# Patient Record
Sex: Male | Born: 1995
Health system: Southern US, Community
[De-identification: ages and names within clinical notes are randomized; demographics above are authoritative.]

## PROBLEM LIST (undated history)

## (undated) DIAGNOSIS — F72 Severe intellectual disabilities: Secondary | ICD-10-CM

## (undated) DIAGNOSIS — F840 Autistic disorder: Secondary | ICD-10-CM

## (undated) DIAGNOSIS — J189 Pneumonia, unspecified organism: Secondary | ICD-10-CM

## (undated) DIAGNOSIS — J309 Allergic rhinitis, unspecified: Secondary | ICD-10-CM

## (undated) DIAGNOSIS — G809 Cerebral palsy, unspecified: Secondary | ICD-10-CM

## (undated) DIAGNOSIS — R569 Unspecified convulsions: Secondary | ICD-10-CM

## (undated) HISTORY — DX: Severe intellectual disabilities: F72

## (undated) HISTORY — PX: NO PAST SURGERIES: SHX2092

---
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
## 2014-02-08 ENCOUNTER — Other Ambulatory Visit: Payer: Self-pay | Admitting: Pediatrics

## 2014-02-08 DIAGNOSIS — F840 Autistic disorder: Secondary | ICD-10-CM

## 2014-02-09 NOTE — Telephone Encounter (Signed)
One refill authorized and note to make appointment,. Patient needs to be transitioned to adult care under new age policy in office.

## 2014-02-16 ENCOUNTER — Other Ambulatory Visit: Payer: Self-pay | Admitting: Pediatrics

## 2014-02-17 ENCOUNTER — Other Ambulatory Visit: Payer: Self-pay | Admitting: Pediatrics

## 2014-02-17 DIAGNOSIS — F840 Autistic disorder: Secondary | ICD-10-CM

## 2014-02-17 MED ORDER — RISPERIDONE 1 MG/ML PO SOLN: ORAL | Status: DC

## 2014-02-17 NOTE — Telephone Encounter (Signed)
Contacted grandmother who stated [name] is not having any problems with the medication and she wishes to continue use. States intermittent constipation is managed with Miralax and she has sufficient quantity; he has been released from care with GI due to good condition. Informed GM that prescription for risperidone is being sent. He will need CPE in June and we will then discuss transition to adult care. GM states she did not have a preference in providers and would follow this MD's recommendation; recommended [facility].

## 2014-04-28 ENCOUNTER — Other Ambulatory Visit: Payer: Self-pay | Admitting: Pediatrics

## 2014-05-27 ENCOUNTER — Other Ambulatory Visit: Payer: Self-pay | Admitting: Pediatrics

## 2014-08-23 ENCOUNTER — Encounter (HOSPITAL_COMMUNITY): Payer: Self-pay | Admitting: Emergency Medicine

## 2014-08-23 ENCOUNTER — Inpatient Hospital Stay (HOSPITAL_COMMUNITY)
Admission: EM | Admit: 2014-08-23 | Discharge: 2014-08-25 | DRG: 101 | Disposition: A | Discharge status: Home / Self Care | Payer: Medicare Other | Attending: Internal Medicine | Admitting: Internal Medicine

## 2014-08-23 DIAGNOSIS — Z79899 Other long term (current) drug therapy: Secondary | ICD-10-CM

## 2014-08-23 DIAGNOSIS — F840 Autistic disorder: Secondary | ICD-10-CM | POA: Diagnosis present

## 2014-08-23 DIAGNOSIS — H50111 Monocular exotropia, right eye: Secondary | ICD-10-CM | POA: Diagnosis present

## 2014-08-23 DIAGNOSIS — R569 Unspecified convulsions: Secondary | ICD-10-CM

## 2014-08-23 HISTORY — DX: Unspecified convulsions: R56.9

## 2014-08-23 MED ORDER — ONDANSETRON HCL 4 MG/2ML IJ SOLN
4.0000 mg | Freq: Once | INTRAMUSCULAR | Status: AC
  Administered 2014-08-23: 4 mg via INTRAVENOUS
  Filled 2014-08-23: qty 2

## 2014-08-23 MED ORDER — LORAZEPAM 2 MG/ML IJ SOLN
INTRAMUSCULAR | Status: AC
  Filled 2014-08-23: qty 1

## 2014-08-23 MED ORDER — LORAZEPAM 2 MG/ML IJ SOLN: 2.0000 mg | Freq: Once | INTRAMUSCULAR | Status: DC

## 2014-08-23 NOTE — ED Notes (Signed)
Patient had large emesis undigested food. PA notified. Airway airway intact.

## 2014-08-23 NOTE — ED Notes (Signed)
Per EMS, patient autistic, and [place]. Family witnessed seizure activity for 45 seconds, postdictal on arrival. Patient nonverbal, and will not follow commands. No hx of seizures. Vomited prior to arrival,  of zofran given. 18 g in left ac. 15L nonrebreather applied for support after seizure, but removed once patient is more calm. Usually patient can walk, but he cannot follow directions. VS with ems. 130/96, p109, o2 99% on room air, cbg 81.

## 2014-08-23 NOTE — ED Notes (Signed)
Mother clarifies, patient was at the bowling alley earlier today, and because he was so hyper, he was brought home. Later on, he had a seizure at home.

## 2014-08-23 NOTE — ED Notes (Signed)
Tatyana, PA-C, at the bedside.  

## 2014-08-23 NOTE — ED Notes (Signed)
EKG completed and given to ED Doctor.

## 2014-08-23 NOTE — ED Provider Notes (Signed)
CSN: [id number]     Arrival date & time 08/23/14  2221 History   First MD Initiated Contact with Patient 08/23/14 2235     Chief Complaint  Patient presents with  . Seizures     (Consider location/radiation/quality/duration/timing/severity/associated sxs/prior Treatment) HPI Jonathan [name] is a 19 y.o. male with history of autism, presents to emergency department after a seizure. Patient is nonverbal at baseline, mother provides most of the history. Patient was at bowling alley earlier, was brought home early because he was "too hyper and acting out." Mother states that he was at home and she states that she saw him as his eyes rolled back and he started shaking, she was able to catch him and lowered him in a chair. Mother denies patient being sick recently. He has no other complaints. He is postictal after the episode. He wears diapers at baseline, unknown urinary incontinence. Mother states that he has never had a seizure before. He is taking Risperdal daily, no new medications. No falls or head injuries. There are no other complaints.  Past Medical History  Diagnosis Date  . Autism    History reviewed. No pertinent past surgical history. Family History  Problem Relation Age of Onset  . Obesity Other   . Stroke Other   . Hypertension Other   . Arthritis Paternal Grandmother    History  Substance Use Topics  . Smoking status: Never Smoker   . Smokeless tobacco: Not on file  . Alcohol Use: No    Review of Systems  Unable to perform ROS: Patient nonverbal      Allergies  Review of patient's allergies indicates no known allergies.  Home Medications   Prior to Admission medications   Medication Sig Start Date End Date Taking? Authorizing Provider  cetirizine (ZYRTEC) 1 MG/ML syrup Take 10 mls (10 mg) by mouth daily at bedtime as needed for allergy symptom control 07/15/13   Maree Erie, MD  pantoprazole (PROTONIX) 20 MG tablet Take 20 mg by mouth daily.    Historical  Provider, MD  Pediatric Multivitamins-Iron (CHILDRENS MULTIVITAMIN/IRON PO) Take by mouth.    Historical Provider, MD  polyethylene glycol powder (GLYCOLAX/MIRALAX) powder Mix one capful in 8 ounces of liquid and drink daily as needed for relief of constipation 07/15/13   Maree Erie, MD  risperiDONE (RISPERDAL) 1 MG/ML oral solution TAKE 0.5 MLS BY MOUTH DAILY AT BEDTIME FOR CONTROL OF AGGITATION 02/17/14 02/18/15  Maree Erie, MD   BP 124/85 mmHg  Pulse 103  Temp(Src) 98.4 F (36.9 C) (Oral)  Resp 22  Ht 5\' 9"  (1.753 m)  Wt 102 lb (46.267 kg)  BMI 15.06 kg/m2  SpO2 98% Physical Exam  Constitutional: He appears well-developed and well-nourished. No distress.  HENT:  Head: Normocephalic and atraumatic.  Eyes: Conjunctivae and EOM are normal. Pupils are equal, round, and reactive to light.  Neck: Neck supple.  Cardiovascular: Normal rate, regular rhythm and normal heart sounds.   Pulmonary/Chest: Effort normal. No respiratory distress. He has no wheezes. He has no rales.  Abdominal: Soft. Bowel sounds are normal. He exhibits no distension. There is no tenderness. There is no rebound.  Musculoskeletal: He exhibits no edema.  Neurological: He is alert.  Nonverbal at baseline. Does not follow directions. Does not squeeze bilateral hands on command.  Skin: Skin is warm and dry.  Nursing note and vitals reviewed.   ED Course  Procedures (including critical care time) Labs Review Labs Reviewed  CBC WITH DIFFERENTIAL/PLATELET -  Abnormal; Notable for the following:    WBC 13.9 (*)    Neutrophils Relative % 78 (*)    Neutro Abs 10.8 (*)    Monocytes Absolute 1.2 (*)    All other components within normal limits  COMPREHENSIVE METABOLIC PANEL - Abnormal; Notable for the following:    Glucose, Bld 116 (*)    ALT 16 (*)    Anion gap 17 (*)    All other components within normal limits  URINALYSIS, ROUTINE W REFLEX MICROSCOPIC (NOT AT [facility]) - Abnormal; Notable for the following:     APPearance CLOUDY (*)    Protein, ur 100 (*)    All other components within normal limits  URINE MICROSCOPIC-ADD ON - Abnormal; Notable for the following:    Squamous Epithelial / LPF FEW (*)    Bacteria, UA FEW (*)    All other components within normal limits  URINE CULTURE  URINE RAPID DRUG SCREEN, HOSP PERFORMED    Imaging Review Dg Chest 1 View  08/24/2014   CLINICAL DATA:  Seizures tonight.  EXAM: CHEST  1 VIEW  COMPARISON:  06/27/2013  FINDINGS: The heart size and mediastinal contours are within normal limits. Both lungs are clear. The visualized skeletal structures are unremarkable.  IMPRESSION: No active disease.   Electronically Signed   By: Ellery Plunk M.D.   On: 08/24/2014 00:56   Ct Head Wo Contrast  08/24/2014   CLINICAL DATA:  Witnessed seizure.  EXAM: CT HEAD WITHOUT CONTRAST  TECHNIQUE: Contiguous axial images were obtained from the base of the skull through the vertex without intravenous contrast.  COMPARISON:  06/27/2013  FINDINGS: No intracranial hemorrhage, mass effect, or midline shift. Incidental note of septum cavum pellucidum, unchanged. No hydrocephalus. The basilar cisterns are patent. No evidence of territorial infarct. No intracranial fluid collection. Calvarium is intact. Included paranasal sinuses and mastoid air cells are well aerated.  IMPRESSION: No acute intracranial abnormality.   Electronically Signed   By: Rubye Oaks M.D.   On: 08/24/2014 00:31     EKG Interpretation   Date/Time:  Tuesday August 23 2014 22:47:30 EDT Ventricular Rate:  100 PR Interval:  127 QRS Duration: 79 QT Interval:  331 QTC Calculation: 427 R Axis:   78 Text Interpretation:  Sinus tachycardia ST elev, probable normal early  repol pattern No significant change since last tracing Confirmed by  Bebe Shaggy  MD, DONALD ([id number]) on 08/23/2014 11:36:25 PM      MDM   Final diagnoses:  Seizure     patient is here after episode of seizure lasting approximately 45  seconds. Mother has been there the whole time. He is postictal, but mother states patient is coming around. Will get blood work.  12:08 AM Patient had another episode of seizure, lasting approximately 30 seconds, tonic-clonic. Patient postictal. He did not receive any medications. Was called in by his mom into the room again, patient having some twitching to the chin and bilateral hands. Rotary movement of the gaze when eyes are opened. Does not follow directions. Will call neurology.  12:15 AM Pt discussed with Dr. Roseanne Reno with neurology. Will start Keppra. They will consult  1:12 AM CT is negative. Chest x-ray negative. Patient is little more awake after second seizure. Keppra is running. Spoke with hospitalist. Family updated.  Filed Vitals:   08/23/14 2245 08/23/14 2300 08/24/14 0000 08/24/14 0045  BP: 115/81 125/88 138/68 127/80  Pulse: 96 98 117 107  Temp:      TempSrc:  Resp: 15 16 19 17   Height:      Weight:      SpO2: 98% 98% 98% 98%     Jaynie Crumble, PA-C 08/24/14 0113  Mancel Bale, MD 08/26/14 [phone]

## 2014-08-24 ENCOUNTER — Emergency Department (HOSPITAL_COMMUNITY): Payer: Medicare Other

## 2014-08-24 ENCOUNTER — Observation Stay (HOSPITAL_COMMUNITY): Payer: Medicare Other

## 2014-08-24 ENCOUNTER — Encounter (HOSPITAL_COMMUNITY): Payer: Self-pay | Admitting: Radiology

## 2014-08-24 DIAGNOSIS — F840 Autistic disorder: Secondary | ICD-10-CM

## 2014-08-24 DIAGNOSIS — G930 Cerebral cysts: Secondary | ICD-10-CM | POA: Diagnosis not present

## 2014-08-24 DIAGNOSIS — H50111 Monocular exotropia, right eye: Secondary | ICD-10-CM | POA: Diagnosis present

## 2014-08-24 DIAGNOSIS — Z79899 Other long term (current) drug therapy: Secondary | ICD-10-CM | POA: Diagnosis not present

## 2014-08-24 DIAGNOSIS — R569 Unspecified convulsions: Secondary | ICD-10-CM

## 2014-08-24 HISTORY — DX: Unspecified convulsions: R56.9

## 2014-08-24 LAB — URINALYSIS, ROUTINE W REFLEX MICROSCOPIC
BILIRUBIN URINE: NEGATIVE
Glucose, UA: NEGATIVE mg/dL
Hgb urine dipstick: NEGATIVE
KETONES UR: NEGATIVE mg/dL
Leukocytes, UA: NEGATIVE
Nitrite: NEGATIVE
Protein, ur: 100 mg/dL — AB
SPECIFIC GRAVITY, URINE: 1.012 (ref 1.005–1.030)
Urobilinogen, UA: 0.2 mg/dL (ref 0.0–1.0)
pH: 8 (ref 5.0–8.0)

## 2014-08-24 LAB — URINE MICROSCOPIC-ADD ON

## 2014-08-24 LAB — CBC WITH DIFFERENTIAL/PLATELET
BASOS ABS: 0.1 10*3/uL (ref 0.0–0.1)
Basophils Absolute: 0 10*3/uL (ref 0.0–0.1)
Basophils Relative: 0 % (ref 0–1)
Basophils Relative: 0 % (ref 0–1)
EOS ABS: 0 10*3/uL (ref 0.0–0.7)
Eosinophils Absolute: 0 10*3/uL (ref 0.0–0.7)
Eosinophils Relative: 0 % (ref 0–5)
Eosinophils Relative: 0 % (ref 0–5)
HCT: 40.1 % (ref 39.0–52.0)
HCT: 42.4 % (ref 39.0–52.0)
HEMOGLOBIN: 14.4 g/dL (ref 13.0–17.0)
Hemoglobin: 14 g/dL (ref 13.0–17.0)
LYMPHS ABS: 0.9 10*3/uL (ref 0.7–4.0)
LYMPHS ABS: 1.8 10*3/uL (ref 0.7–4.0)
Lymphocytes Relative: 10 % — ABNORMAL LOW (ref 12–46)
Lymphocytes Relative: 13 % (ref 12–46)
MCH: 28.3 pg (ref 26.0–34.0)
MCH: 28.5 pg (ref 26.0–34.0)
MCHC: 34 g/dL (ref 30.0–36.0)
MCHC: 34.9 g/dL (ref 30.0–36.0)
MCV: 81.2 fL (ref 78.0–100.0)
MCV: 84 fL (ref 78.0–100.0)
MONO ABS: 1.2 10*3/uL — AB (ref 0.1–1.0)
Monocytes Absolute: 0.8 10*3/uL (ref 0.1–1.0)
Monocytes Relative: 8 % (ref 3–12)
Monocytes Relative: 9 % (ref 3–12)
NEUTROS PCT: 82 % — AB (ref 43–77)
Neutro Abs: 10.8 10*3/uL — ABNORMAL HIGH (ref 1.7–7.7)
Neutro Abs: 7.8 10*3/uL — ABNORMAL HIGH (ref 1.7–7.7)
Neutrophils Relative %: 78 % — ABNORMAL HIGH (ref 43–77)
PLATELETS: 229 10*3/uL (ref 150–400)
Platelets: 180 10*3/uL (ref 150–400)
RBC: 4.94 MIL/uL (ref 4.22–5.81)
RBC: 5.05 MIL/uL (ref 4.22–5.81)
RDW: 13.9 % (ref 11.5–15.5)
RDW: 14.1 % (ref 11.5–15.5)
WBC: 13.9 10*3/uL — ABNORMAL HIGH (ref 4.0–10.5)
WBC: 9.5 10*3/uL (ref 4.0–10.5)

## 2014-08-24 LAB — RAPID URINE DRUG SCREEN, HOSP PERFORMED
Amphetamines: NOT DETECTED
Barbiturates: NOT DETECTED
Benzodiazepines: NOT DETECTED
Cocaine: NOT DETECTED
Opiates: NOT DETECTED
TETRAHYDROCANNABINOL: NOT DETECTED

## 2014-08-24 LAB — COMPREHENSIVE METABOLIC PANEL
ALT: 16 U/L — AB (ref 17–63)
ALT: 16 U/L — ABNORMAL LOW (ref 17–63)
ANION GAP: 7 (ref 5–15)
AST: 30 U/L (ref 15–41)
AST: 30 U/L (ref 15–41)
Albumin: 4.2 g/dL (ref 3.5–5.0)
Albumin: 4.6 g/dL (ref 3.5–5.0)
Alkaline Phosphatase: 68 U/L (ref 38–126)
Alkaline Phosphatase: 73 U/L (ref 38–126)
Anion gap: 17 — ABNORMAL HIGH (ref 5–15)
BILIRUBIN TOTAL: 0.4 mg/dL (ref 0.3–1.2)
BUN: 8 mg/dL (ref 6–20)
BUN: 9 mg/dL (ref 6–20)
CO2: 28 mmol/L (ref 22–32)
CO2: 22 mmol/L (ref 22–32)
CREATININE: 0.81 mg/dL (ref 0.61–1.24)
Calcium: 10 mg/dL (ref 8.9–10.3)
Calcium: 9.5 mg/dL (ref 8.9–10.3)
Chloride: 106 mmol/L (ref 101–111)
Chloride: 106 mmol/L (ref 101–111)
Creatinine, Ser: 0.92 mg/dL (ref 0.61–1.24)
GFR calc Af Amer: >60 mL/min (ref >60)
GFR calc non Af Amer: >60 mL/min (ref >60)
GFR calc non Af Amer: >60 mL/min (ref >60)
GLUCOSE: 92 mg/dL (ref 65–99)
GLUCOSE: 116 mg/dL — AB (ref 65–99)
Potassium: 3.5 mmol/L (ref 3.5–5.1)
Potassium: 4.1 mmol/L (ref 3.5–5.1)
Sodium: 141 mmol/L (ref 135–145)
Sodium: 145 mmol/L (ref 135–145)
TOTAL PROTEIN: 7.9 g/dL (ref 6.5–8.1)
Total Bilirubin: 0.7 mg/dL (ref 0.3–1.2)
Total Protein: 7.6 g/dL (ref 6.5–8.1)

## 2014-08-24 MED ORDER — ACETAMINOPHEN 650 MG RE SUPP: 650.0000 mg | Freq: Four times a day (QID) | RECTAL | Status: DC | PRN

## 2014-08-24 MED ORDER — LORAZEPAM 2 MG/ML IJ SOLN: 1.0000 mg | INTRAMUSCULAR | Status: DC | PRN

## 2014-08-24 MED ORDER — SODIUM CHLORIDE 0.9 % IV SOLN
500.0000 mg | Freq: Two times a day (BID) | INTRAVENOUS | Status: DC
  Administered 2014-08-24 – 2014-08-25 (×3): 500 mg via INTRAVENOUS
  Filled 2014-08-24 (×4): qty 5

## 2014-08-24 MED ORDER — SODIUM CHLORIDE 0.9 % IV SOLN
Freq: Once | INTRAVENOUS | Status: AC
  Administered 2014-08-24: 125 mL/h via INTRAVENOUS

## 2014-08-24 MED ORDER — ONDANSETRON HCL 4 MG/2ML IJ SOLN: 4.0000 mg | Freq: Four times a day (QID) | INTRAMUSCULAR | Status: DC | PRN

## 2014-08-24 MED ORDER — ENOXAPARIN SODIUM 40 MG/0.4ML ~~LOC~~ SOLN
40.0000 mg | Freq: Every day | SUBCUTANEOUS | Status: DC
  Administered 2014-08-24: 40 mg via SUBCUTANEOUS
  Filled 2014-08-24: qty 0.4

## 2014-08-24 MED ORDER — SODIUM CHLORIDE 0.9 % IV SOLN
INTRAVENOUS | Status: DC
  Administered 2014-08-24: 05:00:00 via INTRAVENOUS

## 2014-08-24 MED ORDER — SODIUM CHLORIDE 0.9 % IV SOLN
1000.0000 mg | Freq: Once | INTRAVENOUS | Status: AC
  Administered 2014-08-24: 1000 mg via INTRAVENOUS
  Filled 2014-08-24: qty 10

## 2014-08-24 MED ORDER — LORAZEPAM 2 MG/ML IJ SOLN
1.0000 mg | Freq: Once | INTRAMUSCULAR | Status: AC
Start: 2014-08-24 — End: 2014-08-24
  Administered 2014-08-24: 1 mg via INTRAVENOUS
  Filled 2014-08-24: qty 1

## 2014-08-24 MED ORDER — ONDANSETRON HCL 4 MG PO TABS: 4.0000 mg | ORAL_TABLET | Freq: Four times a day (QID) | ORAL | Status: DC | PRN

## 2014-08-24 MED ORDER — SODIUM CHLORIDE 0.9 % IV SOLN
INTRAVENOUS | Status: DC
  Administered 2014-08-24: 04:00:00 via INTRAVENOUS

## 2014-08-24 MED ORDER — SODIUM CHLORIDE 0.9 % IV SOLN
INTRAVENOUS | Status: DC
  Administered 2014-08-24: 19:00:00 via INTRAVENOUS

## 2014-08-24 MED ORDER — RISPERIDONE 0.5 MG PO TBDP
0.5000 mg | ORAL_TABLET | Freq: Every day | ORAL | Status: DC
  Administered 2014-08-24: 0.5 mg via ORAL
  Filled 2014-08-24 (×2): qty 1

## 2014-08-24 MED ORDER — ENOXAPARIN SODIUM 40 MG/0.4ML ~~LOC~~ SOLN: 40.0000 mg | Freq: Every day | SUBCUTANEOUS | Status: DC

## 2014-08-24 MED ORDER — GADOBENATE DIMEGLUMINE 529 MG/ML IV SOLN
10.0000 mL | Freq: Once | INTRAVENOUS | Status: AC | PRN
  Administered 2014-08-24: 10 mL via INTRAVENOUS

## 2014-08-24 MED ORDER — POLYETHYLENE GLYCOL 3350 17 G PO PACK: 17.0000 g | PACK | Freq: Every day | ORAL | Status: DC | PRN

## 2014-08-24 MED ORDER — ACETAMINOPHEN 325 MG PO TABS: 650.0000 mg | ORAL_TABLET | Freq: Four times a day (QID) | ORAL | Status: DC | PRN

## 2014-08-24 NOTE — Progress Notes (Signed)
Jonathan [name], is a 19 y.o. male, DOB - 05/18/1995, MRN:[id number] admitted this am for Seizures, neuro on Board, MRI and CT stable, EEG pending, on Keppra, seizure free now. Mother bedside updated.    Filed Vitals:   08/24/14 0200 08/24/14 0205 08/24/14 0244 08/24/14 1445  BP:   132/84 129/80  Pulse: 100  96 102  Temp:  97.8 F (36.6 C) 98.6 F (37 C) 97.5 F (36.4 C)  TempSrc:  Axillary Oral Axillary  Resp: 14  14 15   Height:   5' 10.8" (1.798 m)   Weight:   47.356 kg (104 lb 6.4 oz)   SpO2: 100%  100% 98%        Data Review   Micro Results No results found for this or any previous visit (from the past 240 hour(s)).  Radiology Reports Dg Chest 1 View  08/24/2014   CLINICAL DATA:  Seizures tonight.  EXAM: CHEST  1 VIEW  COMPARISON:  06/27/2013  FINDINGS: The heart size and mediastinal contours are within normal limits. Both lungs are clear. The visualized skeletal structures are unremarkable.  IMPRESSION: No active disease.   Electronically Signed   By: Daniel R Mitchell M.D.   On: 08/24/2014 00:56   Ct Head Wo Contrast  08/24/2014   CLINICAL DATA:  Witnessed seizure.  EXAM: CT HEAD WITHOUT CONTRAST  TECHNIQUE: Contiguous axial images were obtained from the base of the skull through the vertex without intravenous contrast.  COMPARISON:  06/27/2013  FINDINGS: No intracranial hemorrhage, mass effect, or midline shift. Incidental note of septum cavum pellucidum, unchanged. No hydrocephalus. The basilar cisterns are patent. No evidence of territorial infarct. No intracranial fluid collection. Calvarium is intact. Included paranasal sinuses and mastoid air cells are well aerated.  IMPRESSION: No acute intracranial abnormality.   Electronically Signed   By: Melanie  Ehinger M.D.   On: 08/24/2014 00:31   Mr Brain W Wo Contrast  08/24/2014   CLINICAL DATA:  Personal history of autism with no onset generalized  seizure, tonic clonic.  EXAM: MRI HEAD WITHOUT AND WITH CONTRAST  TECHNIQUE: Multiplanar, multiecho pulse sequences of the brain and surrounding structures were obtained without and with intravenous contrast.  CONTRAST:  68AuMarylan295[facility] Medic326AuMarylan71[facility]51AuMarylan45[facility] Ambu8723AuMarylan37[place] Hea8076AuMarylan19[place] 6723AuMarylan58[facility] L6732AuMarylanMemorial Hermann Cy4053AuMarylan17[facility]155AuMarylan115[facility] En6454AuMarylan152[facility] 2280AuMarylan254Naples Comm45AuMarylan734[facility] Conva539AuMarylan5429[facility] 3317AuMarylan5[place] Surg2140AuMarylan61[facility] Cent4565AuMarylan73[facility] Ari8796AuMarylaUnion Pines Sur7466AuMarylan46[facility] 6377AuMarylan32Marietta Outpatie5311AuMarylan564South Ms 1062AuMarylan29[address] Specialty Surg9076AuMarylan472Sinai-1366AuMarylan247Maine6373AuMarylan44[facility] 7950AuMarylan513[facility] Ch490AuMarylan6844[facility] Dise425AuMarylan25[facility] 3342AuMarylan584[facility]6323AuMarylan734[facility]8382AuMarylan563[facility]nett HospiHardie PulleyE DIMEGLUMINE 529 MG/ML IV SOLN  COMPARISON:  Head CT same day and 06/27/2013.  FINDINGS: Diffusion imaging does not show any acute infarction or other cause of restricted diffusion. The brainstem and cerebellum are normal. The cerebral hemispheres do not show any evidence of old small or large vessel infarction, mass lesion, hemorrhage, or hydrocephalus. Mesial temporal lobes are normal. After contrast administration, no abnormal enhancement occurs.  The patient does have a cavum septum pellucidum which appears somewhat more dilated than was seen on the study of 06/27/2013. Therefore, this could be an actual expanding cyst of the septum pellucidum which can be symptomatic and associated with certain behavioral disturbances. I am not aware of an association with seizures. Nonetheless, neuro surgical evaluation is warranted, not necessarily emergently however. Certainly this does not appear to be resulting in obstruction or trapping of the lateral ventricles.  IMPRESSION: Normal appearance of the brain parenchyma itself. Expanding cyst of the septum pellucidum. Transverse diameter in the frontal horn region is 20 mm now as opposed 16 mm on 06/27/2013. The significance is indeterminate, but this can be associated with clinical abnormalities including behavioral disturbances.   Electronically Signed   By: Mark  Shogry M.D.   On: 08/24/2014 08:01    CBC  Recent Labs Lab  08/23/14 2351 08/24/14 0444  WBC 13.9* 9.5  HGB 14.4 14.0  HCT 42.4 40.1  PLT 229 180  MCV 84.0 81.2  MCH 28.5 28.3  MCHC 34.0 34.9  RDW 14.1 13.9  LYMPHSABS 1.8 0.9  MONOABS 1.2* 0.8  EOSABS 0.0 0.0  BASOSABS 0.1 0.0    Chemistries   Recent Labs Lab 08/23/14 2351 08/24/14 0444  NA  145 141  K 3.5 4.1  CL 106 106  CO2 22 28  GLUCOSE 116* 92  BUN 9 8  CREATININE 0.92 0.81  CALCIUM 10.0 9.5  AST 30 30  ALT 16* 16*  ALKPHOS 73 68  BILITOT 0.4 0.7   ------------------------------------------------------------------------------------------------------------------ estimated creatinine clearance is 97.5 mL/min (by C-G formula based on Cr of 0.81). ------------------------------------------------------------------------------------------------------------------ No results for input(s): HGBA1C in the last 72 hours. ------------------------------------------------------------------------------------------------------------------ No results for input(s): CHOL, HDL, LDLCALC, TRIG, CHOLHDL, LDLDIRECT in the last 72 hours. ------------------------------------------------------------------------------------------------------------------ No results for input(s): TSH, T4TOTAL, T3FREE, THYROIDAB in the last 72 hours.  Invalid input(s): FREET3 ------------------------------------------------------------------------------------------------------------------ No results for input(s): VITAMINB12, FOLATE, FERRITIN, TIBC, IRON, RETICCTPCT in the last 72 hours.  Coagulation profile No results for input(s): INR, PROTIME in the last 168 hours.  No results for input(s): DDIMER in the last 72 hours.  Cardiac Enzymes No results for input(s): CKMB, TROPONINI, MYOGLOBIN in the last 168 hours.  Invalid input(s): CK ------------------------------------------------------------------------------------------------------------------ Invalid input(s): POCBNP

## 2014-08-24 NOTE — Progress Notes (Signed)
EEG completed, results pending. 

## 2014-08-24 NOTE — ED Notes (Signed)
Ativan held per provider request.

## 2014-08-24 NOTE — ED Notes (Signed)
Attempted report. Nurse to call back.

## 2014-08-24 NOTE — Progress Notes (Signed)
Patient went to MRI, unable to stay still for good imaging. Patient has autism and non-verbal as baseline. Craige Cotta NP paged to get order for some medications to calm patient down.   Craige Cotta NP placed order for Ativan 1 mg IV. Will carry out order.

## 2014-08-24 NOTE — Consult Note (Signed)
Admission H&P    Chief Complaint: New onset generalized seizures.  HPI: Jonathan [name] is an 19 y.o. male with a history of autism brought to the emergency room following a witnessed generalized seizure. Patient had a recurrent seizure after arriving in the emergency room. He has no previous history of seizure activity. CT scan of his head was unremarkable. Routine laboratory studies were unremarkable except for cloudy urine with few bacteria. Urine culture is pending. Patient was afebrile. He was given a loading dose of Keppra 1000 mg IV after receiving a total of 4 mg of Ativan IV. He has had no recurrence of seizure activity. Urine drug screen is pending.  Past Medical History  Diagnosis Date  . Autism   . Seizure 08/24/2014    History reviewed. No pertinent past surgical history.  Family History  Problem Relation Age of Onset  . Obesity Other   . Stroke Other   . Hypertension Other   . Arthritis Paternal Grandmother    Social History:  reports that he has never smoked. He does not have any smokeless tobacco history on file. He reports that he does not drink alcohol or use illicit drugs.  Allergies: No Known Allergies  Medications: Patient's preadmission medications were reviewed by me.  ROS: History obtained from patient's guardian.  General ROS: negative for - chills, fatigue, fever, night sweats, weight gain or weight loss Psychological ROS: negative for - behavioral disorder, hallucinations, memory difficulties, mood swings or suicidal ideation Ophthalmic ROS: negative for - blurry vision, double vision, eye pain or loss of vision ENT ROS: negative for - epistaxis, nasal discharge, oral lesions, sore throat, tinnitus or vertigo Allergy and Immunology ROS: negative for - hives or itchy/watery eyes Hematological and Lymphatic ROS: negative for - bleeding problems, bruising or swollen lymph nodes Endocrine ROS: negative for - galactorrhea, hair pattern changes,  polydipsia/polyuria or temperature intolerance Respiratory ROS: negative for - cough, hemoptysis, shortness of breath or wheezing Cardiovascular ROS: negative for - chest pain, dyspnea on exertion, edema or irregular heartbeat Gastrointestinal ROS: negative for - abdominal pain, diarrhea, hematemesis, nausea/vomiting or stool incontinence Genito-Urinary ROS: negative for - dysuria, hematuria, incontinence or urinary frequency/urgency Musculoskeletal ROS: negative for - joint swelling or muscular weakness Neurological ROS: as noted in HPI Dermatological ROS: negative for rash and skin lesion changes  Physical Examination: Blood pressure 127/80, pulse 107, temperature 98.4 F (36.9 C), temperature source Oral, resp. rate 17, height 5' 9"  (1.753 m), weight 46.267 kg (102 lb), SpO2 98 %.  HEENT-  Normocephalic, no lesions, without obvious abnormality.  Normal external eye and conjunctiva.  Normal TM's bilaterally.  Normal auditory canals and external ears. Normal external nose, mucus membranes and septum.  Normal pharynx. Neck supple with no masses, nodes, nodules or enlargement. Cardiovascular - regular rate and rhythm, S1, S2 normal, no murmur, click, rub or gallop Lungs - chest clear, no wheezing, rales, normal symmetric air entry Abdomen - soft, non-tender; bowel sounds normal; no masses,  no organomegaly Extremities - no edema and no skin discoloration; bilateral plantar flexion contractures of ankles noted.  Neurologic Examination: Patient was obtunded and in no distress. He had withdrawal movements to noxious stimuli. Respirations were regular and normal. Pupils were equal and reacted normally to light. Extraocular movements were intact with oculocephalic maneuvers. Exotropia right eye was noted. No facial weakness was noted. Muscle tone was flaccid throughout. Deep tendon reflexes were 2+, brisk and symmetrical. Motor responses were mute bilaterally.  Results for orders placed or  performed during the hospital encounter of 08/23/14 (from the past 48 hour(s))  Urinalysis, Routine w reflex microscopic (not at [facility])     Status: Abnormal   Collection Time: 08/23/14 11:35 PM  Result Value Ref Range   Color, Urine YELLOW YELLOW   APPearance CLOUDY (A) CLEAR   Specific Gravity, Urine 1.012 1.005 - 1.030   pH 8.0 5.0 - 8.0   Glucose, UA NEGATIVE NEGATIVE mg/dL   Hgb urine dipstick NEGATIVE NEGATIVE   Bilirubin Urine NEGATIVE NEGATIVE   Ketones, ur NEGATIVE NEGATIVE mg/dL   Protein, ur 100 (A) NEGATIVE mg/dL   Urobilinogen, UA 0.2 0.0 - 1.0 mg/dL   Nitrite NEGATIVE NEGATIVE   Leukocytes, UA NEGATIVE NEGATIVE  Urine microscopic-add on     Status: Abnormal   Collection Time: 08/23/14 11:35 PM  Result Value Ref Range   Squamous Epithelial / LPF FEW (A) RARE   WBC, UA 0-2 <3 WBC/hpf   RBC / HPF 0-2 <3 RBC/hpf   Bacteria, UA FEW (A) RARE  CBC with Differential     Status: Abnormal   Collection Time: 08/23/14 11:51 PM  Result Value Ref Range   WBC 13.9 (H) 4.0 - 10.5 K/uL   RBC 5.05 4.22 - 5.81 MIL/uL   Hemoglobin 14.4 13.0 - 17.0 g/dL   HCT 42.4 39.0 - 52.0 %   MCV 84.0 78.0 - 100.0 fL   MCH 28.5 26.0 - 34.0 pg   MCHC 34.0 30.0 - 36.0 g/dL   RDW 14.1 11.5 - 15.5 %   Platelets 229 150 - 400 K/uL   Neutrophils Relative % 78 (H) 43 - 77 %   Neutro Abs 10.8 (H) 1.7 - 7.7 K/uL   Lymphocytes Relative 13 12 - 46 %   Lymphs Abs 1.8 0.7 - 4.0 K/uL   Monocytes Relative 9 3 - 12 %   Monocytes Absolute 1.2 (H) 0.1 - 1.0 K/uL   Eosinophils Relative 0 0 - 5 %   Eosinophils Absolute 0.0 0.0 - 0.7 K/uL   Basophils Relative 0 0 - 1 %   Basophils Absolute 0.1 0.0 - 0.1 K/uL  Comprehensive metabolic panel     Status: Abnormal   Collection Time: 08/23/14 11:51 PM  Result Value Ref Range   Sodium 145 135 - 145 mmol/L   Potassium 3.5 3.5 - 5.1 mmol/L   Chloride 106 101 - 111 mmol/L   CO2 22 22 - 32 mmol/L   Glucose, Bld 116 (H) 65 - 99 mg/dL   BUN 9 6 - 20 mg/dL    Creatinine, Ser 0.92 0.61 - 1.24 mg/dL   Calcium 10.0 8.9 - 10.3 mg/dL   Total Protein 7.9 6.5 - 8.1 g/dL   Albumin 4.6 3.5 - 5.0 g/dL   AST 30 15 - 41 U/L   ALT 16 (L) 17 - 63 U/L   Alkaline Phosphatase 73 38 - 126 U/L   Total Bilirubin 0.4 0.3 - 1.2 mg/dL   GFR calc non Af Amer >60 >60 mL/min   GFR calc Af Amer >60 >60 mL/min    Comment: (NOTE) The eGFR has been calculated using the CKD EPI equation. This calculation has not been validated in all clinical situations. eGFR's persistently <60 mL/min signify possible Chronic Kidney Disease.    Anion gap 17 (H) 5 - 15   Dg Chest 1 View  08/24/2014   CLINICAL DATA:  Seizures tonight.  EXAM: CHEST  1 VIEW  COMPARISON:  06/27/2013  FINDINGS: The heart size and  mediastinal contours are within normal limits. Both lungs are clear. The visualized skeletal structures are unremarkable.  IMPRESSION: No active disease.   Electronically Signed   By: Andreas Newport M.D.   On: 08/24/2014 00:56   Ct Head Wo Contrast  08/24/2014   CLINICAL DATA:  Witnessed seizure.  EXAM: CT HEAD WITHOUT CONTRAST  TECHNIQUE: Contiguous axial images were obtained from the base of the skull through the vertex without intravenous contrast.  COMPARISON:  06/27/2013  FINDINGS: No intracranial hemorrhage, mass effect, or midline shift. Incidental note of septum cavum pellucidum, unchanged. No hydrocephalus. The basilar cisterns are patent. No evidence of territorial infarct. No intracranial fluid collection. Calvarium is intact. Included paranasal sinuses and mastoid air cells are well aerated.  IMPRESSION: No acute intracranial abnormality.   Electronically Signed   By: Jeb Levering M.D.   On: 08/24/2014 00:31    Assessment/Plan 19 year old with autism presenting with new onset generalized seizures. Clinical exam was nonfocal. CT showed no acute intracranial abnormality.  Recommendations: 1. Continue Keppra at 500 mg twice a day. 2. MRI of the brain without and with  contrast. 3. EEG, routine study. 4. Outpatient neurology follow-up following discharge.  We will continue to follow this patient with you.  C.R. Nicole Kindred, MD [facility] [phone]  08/24/2014, 1:13 AM

## 2014-08-24 NOTE — Progress Notes (Signed)
Patient was at MRI, Ativan  IV was given at [phone]. RN unable to scan patient ID band and Ativan.

## 2014-08-24 NOTE — Progress Notes (Signed)
Received report on patient from Darl Pikes, ED nurse.

## 2014-08-24 NOTE — Progress Notes (Signed)
Admission Note  Patient arrived to floor in room 5W01 via bed from ED. Patient alert, nonverbal, not follow commands, unable to determine patient's orientation. Vitals signs  Oral temperature 98.6 F       Blood pressure 132/84       Pulse 96       RR 14       SpO2 100 % on room air. Faces pain scale 0. Skin intact, no pressure ulcer noted in sacral area. Telemetry monitor placed.  Patient's ID armband verified with patient/ family, and in place. Information packet given to patient/ family. Fall risk assessed, SR up X2, patient/ family able to verbalize understanding of risks associated with falls and to call nurse or staff to assist before getting out of bed. Patient/ family oriented to room and equipment. Call bell within reach.

## 2014-08-24 NOTE — Progress Notes (Signed)
[facility] Admissions paged for admitting MD to see patient. 

## 2014-08-24 NOTE — Procedures (Signed)
ELECTROENCEPHALOGRAM REPORT   Patient: Jonathan [name]       Room #: 1B14 EEG No. ID: [id number] Age: 19 y.o.        Sex: male Referring Physician: Toniann Fail Report Date:  08/24/2014        Interpreting Physician: Thana Farr  History: [name] is an 19 y.o. male with a history of autism presenting with new onset seizures.    Medications:  Scheduled: . enoxaparin (LOVENOX) injection  40 mg Subcutaneous Daily  . levETIRAcetam  500 mg Intravenous Q12H  . risperiDONE  0.5 mg Oral QHS    Conditions of Recording:  This is a 16 channel EEG carried out with the patient in the drowsy and asleep state.Marland Kitchen  Description:  The patient does not achieve wakefulness during the recording for a waking background activity to be evaluated.  The patient is in drowse and sleep for the entire recording.  During drowse the background activity is slow consisting of a continuous mixture of theta and delta activity.  Superimposed is a well organized beta activity that is diffusely distributed.  This beta activity continues during sleep.  Also noted during sleep is  symmetrical sleep spindles and vertex central sharp transients. No epileptiform activity is noted.   Hyperventilation was not performed.  Intermittent photic stimulation was performed but failed to illicit any change in the tracing.     IMPRESSION: This is a normal drowsy and asleep electroencephalogram.  The diffusely distributed beta activity is likely a medication effect related to benzodiazepine administration.  No epileptiform activity is noted.     Thana Farr, MD [facility] [phone] 08/24/2014, 4:02 PM

## 2014-08-24 NOTE — H&P (Signed)
[facility] History and Physical  Jonathan [name] ZOX:[id number] DOB: 1995/07/30 DOA: 08/23/2014  Referring physician: Ms. Lemont Fillers. PCP: Maree Erie, MD  Specialists: None.  Chief Complaint: Seizure.  HPI: Jonathan [name] is a 19 y.o. male with history of autism was brought to the ER after patient was witnessed to have a generalized seizure by patient's mother. Patient also had another seizure in the ER. Patient's first seizure at home lasted for 45 seconds and was tonic-clonic. Patient did not lose consciousness following the seizure. Patient had another similar one in the ER witnessed by the ER physician assistant. On-call neurologist was called and patient was given Keppra loading dose. CT head did not show anything acute. As per patient's mother patient has been drowsy after the seizure and not back to his baseline. Patient is usually noncommunicative given his history of autism.   Review of Systems: As presented in the history of presenting illness, rest negative.  Past Medical History  Diagnosis Date  . Autism   . Seizure 08/24/2014   History reviewed. No pertinent past surgical history. Social History:  reports that he has never smoked. He does not have any smokeless tobacco history on file. He reports that he does not drink alcohol or use illicit drugs. Where does patient live home. Can patient participate in ADLs? No.  No Known Allergies  Family History:  Family History  Problem Relation Age of Onset  . Obesity Other   . Stroke Other   . Hypertension Other   . Arthritis Paternal Grandmother   . Seizures Maternal Uncle       Prior to Admission medications   Medication Sig Start Date End Date Taking? Authorizing Provider  cetirizine (ZYRTEC) 1 MG/ML syrup Take 10 mls (10 mg) by mouth daily at bedtime as needed for allergy symptom control 07/15/13  Yes Maree Erie, MD  polyethylene glycol powder (GLYCOLAX/MIRALAX) powder Mix one capful in 8 ounces of liquid and  drink daily as needed for relief of constipation 07/15/13  Yes Maree Erie, MD  risperiDONE (RISPERDAL) 1 MG/ML oral solution TAKE 0.5 MLS BY MOUTH DAILY AT BEDTIME FOR CONTROL OF AGGITATION 02/17/14 02/18/15 Yes Maree Erie, MD    Physical Exam: Filed Vitals:   08/24/14 0130 08/24/14 0200 08/24/14 0205 08/24/14 0244  BP: 131/84   132/84  Pulse: 103 100  96  Temp:   97.8 F (36.6 C) 98.6 F (37 C)  TempSrc:   Axillary Oral  Resp: Height:    5' 10.8" (1.798 m)  Weight:    47.356 kg (104 lb 6.4 oz)  SpO2: 100% 100%  100%     General:  Moderately built and nourished.  Eyes: Anicteric no pallor.  ENT: No discharge from the ears eyes nose or mouth.  Neck: No mass felt.  Cardiovascular: S1 and S2 heard.  Respiratory: No rhonchi or crepitations.  Abdomen: Soft nontender bowel sounds present.  Skin: No rash.  Musculoskeletal: No edema.  Psychiatric: Patient has history of autism.  Neurologic: Patient has history of autism and presently mildly drowsy and neurological exam not complete.  Labs on Admission:  Basic Metabolic Panel:  Recent Labs Lab 08/23/14 2351  NA 145  K 3.5  CL 106  CO2 22  GLUCOSE 116*  BUN 9  CREATININE 0.92  CALCIUM 10.0   Liver Function Tests:  Recent Labs Lab 08/23/14 2351  AST 30  ALT 16*  ALKPHOS 73  BILITOT 0.4  PROT 7.9  ALBUMIN 4.6   No results for input(s): LIPASE, AMYLASE in the last 168 hours. No results for input(s): AMMONIA in the last 168 hours. CBC:  Recent Labs Lab 08/23/14 2351  WBC 13.9*  NEUTROABS 10.8*  HGB 14.4  HCT 42.4  MCV 84.0  PLT 229   Cardiac Enzymes: No results for input(s): CKTOTAL, CKMB, CKMBINDEX, TROPONINI in the last 168 hours.  BNP (last 3 results) No results for input(s): BNP in the last 8760 hours.  ProBNP (last 3 results) No results for input(s): PROBNP in the last 8760 hours.  CBG: No results for input(s): GLUCAP in the last 168 hours.  Radiological Exams  on Admission: Dg Chest 1 View  08/24/2014   CLINICAL DATA:  Seizures tonight.  EXAM: CHEST  1 VIEW  COMPARISON:  06/27/2013  FINDINGS: The heart size and mediastinal contours are within normal limits. Both lungs are clear. The visualized skeletal structures are unremarkable.  IMPRESSION: No active disease.   Electronically Signed   By: Ellery Plunk M.D.   On: 08/24/2014 00:56   Ct Head Wo Contrast  08/24/2014   CLINICAL DATA:  Witnessed seizure.  EXAM: CT HEAD WITHOUT CONTRAST  TECHNIQUE: Contiguous axial images were obtained from the base of the skull through the vertex without intravenous contrast.  COMPARISON:  06/27/2013  FINDINGS: No intracranial hemorrhage, mass effect, or midline shift. Incidental note of septum cavum pellucidum, unchanged. No hydrocephalus. The basilar cisterns are patent. No evidence of territorial infarct. No intracranial fluid collection. Calvarium is intact. Included paranasal sinuses and mastoid air cells are well aerated.  IMPRESSION: No acute intracranial abnormality.   Electronically Signed   By: Rubye Oaks M.D.   On: 08/24/2014 00:31    EKG: Independently reviewed. Normal sinus rhythm with ST changes probably from early repolarization.  Assessment/Plan Principal Problem:   Seizure   1. Seizure - appreciate neurology consult. MRI brain with and without contrast has been ordered and also EEG. Patient has received Keppra loading dose and I have placed patient on 500 mg every 12 hourly as recommended by the neurologist. When necessary Ativan for any breakthrough seizures. 2. History of autism - continue Risperdal.  Patient's urine shows bacteria positive but is nitrites and leukocyte esterase negative. Follow urine cultures.   DVT Prophylaxis Lovenox.  Code Status: Full code.  Family Communication: Discussed with patient's mother.  Disposition Plan: Admit for observation.    [name] N. [facility] Pager [phone].  If 7PM-7AM,  please contact night-coverage [email or web address] Password [facility] 08/24/2014, 3:57 AM

## 2014-08-24 NOTE — ED Notes (Signed)
Admitting physician at the bedside at this time. 

## 2014-08-24 NOTE — ED Provider Notes (Signed)
  Face-to-face evaluation   History: Patient at home today and had a seizure. No history of similar. No other known recent illnesses.  Physical exam:  Medical screening examination/treatment/procedure(s) were conducted as a shared visit with non-physician practitioner(s) and myself.  I personally evaluated the patient during the encounter  Mancel Bale, MD 08/26/14 [phone]

## 2014-08-25 DIAGNOSIS — R569 Unspecified convulsions: Principal | ICD-10-CM

## 2014-08-25 MED ORDER — LEVETIRACETAM 500 MG PO TABS: 500.0000 mg | ORAL_TABLET | Freq: Two times a day (BID) | ORAL | Status: DC

## 2014-08-25 NOTE — Progress Notes (Signed)
Patient discharge teaching given, including activity, diet, follow-up appoints, and medications. Patient verbalized understanding of all discharge instructions. IV access was d/c'd. Vitals are stable. Skin is intact except as charted in most recent assessments. Pt to be escorted out by NT, to be driven home by family. 

## 2014-08-25 NOTE — Care Management Note (Signed)
Case Management Note  Patient Details  Name: Jonathan [name] MRN: [id number] Date of Birth: [birth year]  Subjective/Objective:                 Patient from home with mother, autistic adult who had a  seizure.    Action/Plan:  Will discharge to home with mother. No HH or DME needs identified.  Expected Discharge Date:  08/26/14               Expected Discharge Plan:  Home/Self Care  In-House Referral:     Discharge planning Services  CM Consult  Post Acute Care Choice:    Choice offered to:     DME Arranged:    DME Agency:     HH Arranged:    HH Agency:     Status of Service:  Completed, signed off  Medicare Important Message Given:    Date Medicare IM Given:    Medicare IM give by:    Date Additional Medicare IM Given:    Additional Medicare Important Message give by:     If discussed at Long Length of Stay Meetings, dates discussed:    Additional Comments:  [name], RN 08/25/2014, 2:11 PM

## 2014-08-25 NOTE — Progress Notes (Signed)
NEURO HOSPITALIST PROGRESS NOTE   SUBJECTIVE:                                                                                                                        No further seizures reported. Family is at the bedside and said that he is back to baseline. MRI brain was personally reviewed and showed no acute abnormality. EEG normal. On keppra 500 mg IV BID. Serologies unimpressive.  OBJECTIVE:                                                                                                                           Vital signs in last 24 hours: Temp:  [97.5 F (36.4 C)-98.5 F (36.9 C)] 98.5 F (36.9 C) (07/27 2155) Pulse Rate:  [83-102] 83 (07/28 0644) Resp:  [15-20] 20 (07/28 0644) BP: (126-129)/(76-81) 126/76 mmHg (07/28 0644) SpO2:  [98 %-100 %] 100 % (07/28 0644)  Intake/Output from previous day: 07/27 0701 - 07/28 0700 In: 600 [P.O.:600] Out: 300 [Urine:300] Intake/Output this shift: Total I/O In: -  Out: 400 [Urine:400] Nutritional status: Diet regular Room service appropriate?: Yes; Fluid consistency:: Thin  Past Medical History  Diagnosis Date  . Autism   . Seizure 08/24/2014   Physical Examination:  HEENT- Normocephalic, no lesions, without obvious abnormality. Normal external eye and conjunctiva. Normal TM's bilaterally. Normal auditory canals and external ears. Normal external nose, mucus membranes and septum. Normal pharynx. Neck supple with no masses, nodes, nodules or enlargement. Cardiovascular - regular rate and rhythm, S1, S2 normal, no murmur, click, rub or gallop Lungs - chest clear, no wheezing, rales, normal symmetric air entry Abdomen - soft, non-tender; bowel sounds normal; no masses, no organomegaly Extremities - no edema and no skin discoloration; bilateral plantar flexion contractures of ankles noted.  Neurologic Exam:  Patient is alert and awake, repetitive arm movements, not able to follow  commands Pupils were equal and reacted normally to light. Extraocular movements were intact with oculocephalic maneuvers. Exotropia right eye was noted. No facial weakness was noted. Muscle tone was flaccid throughout. Deep tendon reflexes were 2+, brisk and symmetrical. Motor responses were mute bilaterally.  Lab Results: No results found for: CHOL Lipid Panel No results for input(s): CHOL, TRIG, HDL,  CHOLHDL, VLDL, LDLCALC in the last 72 hours.  Studies/Results: Dg Chest 1 View  08/24/2014   CLINICAL DATA:  Seizures tonight.  EXAM: CHEST  1 VIEW  COMPARISON:  06/27/2013  FINDINGS: The heart size and mediastinal contours are within normal limits. Both lungs are clear. The visualized skeletal structures are unremarkable.  IMPRESSION: No active disease.   Electronically Signed   By: Ellery Plunk M.D.   On: 08/24/2014 00:56   Ct Head Wo Contrast  08/24/2014   CLINICAL DATA:  Witnessed seizure.  EXAM: CT HEAD WITHOUT CONTRAST  TECHNIQUE: Contiguous axial images were obtained from the base of the skull through the vertex without intravenous contrast.  COMPARISON:  06/27/2013  FINDINGS: No intracranial hemorrhage, mass effect, or midline shift. Incidental note of septum cavum pellucidum, unchanged. No hydrocephalus. The basilar cisterns are patent. No evidence of territorial infarct. No intracranial fluid collection. Calvarium is intact. Included paranasal sinuses and mastoid air cells are well aerated.  IMPRESSION: No acute intracranial abnormality.   Electronically Signed   By: Rubye Oaks M.D.   On: 08/24/2014 00:31   Mr Laqueta Jean GN Contrast  08/24/2014   CLINICAL DATA:  Personal history of autism with no onset generalized seizure, tonic clonic.  EXAM: MRI HEAD WITHOUT AND WITH CONTRAST  TECHNIQUE: Multiplanar, multiecho pulse sequences of the brain and surrounding structures were obtained without and with intravenous contrast.  CONTRAST:  10mL MULTIHANCE GADOBENATE DIMEGLUMINE 529 MG/ML  IV SOLN  COMPARISON:  Head CT same day and 06/27/2013.  FINDINGS: Diffusion imaging does not show any acute infarction or other cause of restricted diffusion. The brainstem and cerebellum are normal. The cerebral hemispheres do not show any evidence of old small or large vessel infarction, mass lesion, hemorrhage, or hydrocephalus. Mesial temporal lobes are normal. After contrast administration, no abnormal enhancement occurs.  The patient does have a cavum septum pellucidum which appears somewhat more dilated than was seen on the study of 06/27/2013. Therefore, this could be an actual expanding cyst of the septum pellucidum which can be symptomatic and associated with certain behavioral disturbances. I am not aware of an association with seizures. Nonetheless, neuro surgical evaluation is warranted, not necessarily emergently however. Certainly this does not appear to be resulting in obstruction or trapping of the lateral ventricles.  IMPRESSION: Normal appearance of the brain parenchyma itself. Expanding cyst of the septum pellucidum. Transverse diameter in the frontal horn region is 20 mm now as opposed 16 mm on 06/27/2013. The significance is indeterminate, but this can be associated with clinical abnormalities including behavioral disturbances.   Electronically Signed   By: Paulina Fusi M.D.   On: 08/24/2014 08:01    MEDICATIONS                                                                                                                        Scheduled: . enoxaparin (LOVENOX) injection  40 mg Subcutaneous Daily  . levETIRAcetam  500 mg Intravenous Q12H  .  risperiDONE  0.5 mg Oral QHS    ASSESSMENT/PLAN:                                                                                                            19 year old with autism presenting with new onset generalized seizures.  MRI brain without acute abnormality, EEG normal. Although seizure work up is unremarkable, in this particular  scenario will recommend maintaining patient on keppra 500 mg BID. Follow up with epileptologist Dr Karel Jarvis in 2 weeks. Neurology will sign off.  Wyatt Portela, MD [facility] [phone]  08/25/2014, 9:46 AM

## 2014-08-25 NOTE — Discharge Instructions (Signed)
Follow with Primary MD Maree Erie, MD in 7 days   Get CBC, CMP, 2 view Chest X ray checked  by Primary MD next visit.    Activity: As tolerated with Full fall precautions use walker/cane & assistance as needed   Disposition Home     Diet: Heart Healthy  with feeding assistance and aspiration precautions.  For Heart failure patients - Check your Weight same time everyday, if you gain over 2 pounds, or you develop in leg swelling, experience more shortness of breath or chest pain, call your Primary MD immediately. Follow Cardiac Low Salt Diet and 1.5 lit/day fluid restriction.   On your next visit with your primary care physician please Get Medicines reviewed and adjusted.   Please request your Prim.MD to go over all Hospital Tests and Procedure/Radiological results at the follow up, please get all Hospital records sent to your Prim MD by signing hospital release before you go home.   If you experience worsening of your admission symptoms, develop shortness of breath, life threatening emergency, suicidal or homicidal thoughts you must seek medical attention immediately by calling 911 or calling your MD immediately  if symptoms less severe.  You Must read complete instructions/literature along with all the possible adverse reactions/side effects for all the Medicines you take and that have been prescribed to you. Take any new Medicines after you have completely understood and accpet all the possible adverse reactions/side effects.   Do not drive, operating heavy machinery, perform activities at heights, swimming or participation in water activities or provide baby sitting services if your were admitted for syncope or siezures until you have seen by Primary MD or a Neurologist and advised to do so again.  Do not drive when taking Pain medications.    Do not take more than prescribed Pain, Sleep and Anxiety Medications  Special Instructions: If you have smoked or chewed Tobacco  in  the last 2 yrs please stop smoking, stop any regular Alcohol  and or any Recreational drug use.  Wear Seat belts while driving.   Please note  You were cared for by a hospitalist during your hospital stay. If you have any questions about your discharge medications or the care you received while you were in the hospital after you are discharged, you can call the unit and asked to speak with the hospitalist on call if the hospitalist that took care of you is not available. Once you are discharged, your primary care physician will handle any further medical issues. Please note that NO REFILLS for any discharge medications will be authorized once you are discharged, as it is imperative that you return to your primary care physician (or establish a relationship with a primary care physician if you do not have one) for your aftercare needs so that they can reassess your need for medications and monitor your lab values.

## 2014-08-25 NOTE — Discharge Summary (Signed)
Jonathan [name], is a 19 y.o. male  DOB Aug 13, 1995  MRN [id number].  Admission date:  08/23/2014  Admitting Physician  Eduard Clos, MD  Discharge Date:  08/25/2014   Primary MD  Maree Erie, MD  Recommendations for primary care physician for things to follow:   Needs outpatient neurology follow-up   Admission Diagnosis  Seizure [R56.9]   Discharge Diagnosis  Seizure [R56.9]    Principal Problem:   Seizure      Past Medical History  Diagnosis Date  . Autism   . Seizure 08/24/2014    History reviewed. No pertinent past surgical history.     HPI  from the history and physical done on the day of admission:     Jonathan [name] is a 19 y.o. male with history of autism was brought to the ER after patient was witnessed to have a generalized seizure by patient's mother. Patient also had another seizure in the ER. Patient's first seizure at home lasted for 45 seconds and was tonic-clonic. Patient did not lose consciousness following the seizure. Patient had another similar one in the ER witnessed by the ER physician assistant. On-call neurologist was called and patient was given Keppra loading dose. CT head did not show anything acute. As per patient's mother patient has been drowsy after the seizure and not back to his baseline. Patient is usually noncommunicative given his history of autism.     Hospital Course:    1. Seizure. New onset. MRI CT and EEG unremarkable, seen by neurologist placed on Keppra, outpatient neurology follow-up recommended. After decision of Keppra to further evidence of breakthrough seizures.   2. History of autism. Nonverbal, gets good support from family, continue Risperdal.        Discharge Condition: Fair  Follow UP  Follow-up Information    Follow up with Maree Erie, MD. Schedule an appointment as soon as possible for a visit in 1 week.   Specialty:  Pediatrics   Contact information:   [address] [address] [phone]       Follow up with [facility]. Schedule an appointment as soon as possible for a visit in 1 week.   Why:  seizures   Contact information:   [address]     [address] [place] [address] [phone]       Consults obtained - Neuro  Diet and Activity recommendation: See Discharge Instructions below  Discharge Instructions       Discharge Instructions    Discharge instructions    Complete by:  As directed   Follow with Primary MD Maree Erie, MD in 7 days   Get CBC, CMP, 2 view Chest X ray checked  by Primary MD next visit.    Activity: As tolerated with Full fall precautions use walker/cane & assistance as needed   Disposition Home     Diet: Heart Healthy  with feeding assistance and aspiration precautions.  For Heart failure patients - Check your  Weight same time everyday, if you gain over 2 pounds, or you develop in leg swelling, experience more shortness of breath or chest pain, call your Primary MD immediately. Follow Cardiac Low Salt Diet and 1.5 lit/day fluid restriction.   On your next visit with your primary care physician please Get Medicines reviewed and adjusted.   Please request your Prim.MD to go over all Hospital Tests and Procedure/Radiological results at the follow up, please get all Hospital records sent to your Prim MD by signing hospital release before you go home.   If you experience worsening of your admission symptoms, develop shortness of breath, life threatening emergency, suicidal or homicidal thoughts you must seek medical attention immediately by calling 911 or calling your MD immediately  if symptoms less severe.  You Must read complete instructions/literature along with all the possible adverse reactions/side  effects for all the Medicines you take and that have been prescribed to you. Take any new Medicines after you have completely understood and accpet all the possible adverse reactions/side effects.   Do not drive, operating heavy machinery, perform activities at heights, swimming or participation in water activities or provide baby sitting services if your were admitted for syncope or siezures until you have seen by Primary MD or a Neurologist and advised to do so again.  Do not drive when taking Pain medications.    Do not take more than prescribed Pain, Sleep and Anxiety Medications  Special Instructions: If you have smoked or chewed Tobacco  in the last 2 yrs please stop smoking, stop any regular Alcohol  and or any Recreational drug use.  Wear Seat belts while driving.   Please note  You were cared for by a hospitalist during your hospital stay. If you have any questions about your discharge medications or the care you received while you were in the hospital after you are discharged, you can call the unit and asked to speak with the hospitalist on call if the hospitalist that took care of you is not available. Once you are discharged, your primary care physician will handle any further medical issues. Please note that NO REFILLS for any discharge medications will be authorized once you are discharged, as it is imperative that you return to your primary care physician (or establish a relationship with a primary care physician if you do not have one) for your aftercare needs so that they can reassess your need for medications and monitor your lab values.     Increase activity slowly    Complete by:  As directed              Discharge Medications       Medication List    TAKE these medications        cetirizine 1 MG/ML syrup  Commonly known as:  ZYRTEC  Take 10 mls (10 mg) by mouth daily at bedtime as needed for allergy symptom control     levETIRAcetam 500 MG tablet  Commonly  known as:  KEPPRA  Take 1 tablet (500 mg total) by mouth 2 (two) times daily.     polyethylene glycol powder powder  Commonly known as:  GLYCOLAX/MIRALAX  Mix one capful in 8 ounces of liquid and drink daily as needed for relief of constipation     risperiDONE 1 MG/ML oral solution  Commonly known as:  RISPERDAL  TAKE 0.5 MLS BY MOUTH DAILY AT BEDTIME FOR CONTROL OF AGGITATION        Major procedures and Radiology  Reports - PLEASE review detailed and final reports for all details, in brief -    EEG This is a normal drowsy and asleep electroencephalogram. The diffusely distributed beta activity is likely a medication effect related to benzodiazepine administration. No epileptiform activity is noted.    Dg Chest 1 View  08/24/2014   CLINICAL DATA:  Seizures tonight.  EXAM: CHEST  1 VIEW  COMPARISON:  06/27/2013  FINDINGS: The heart size and mediastinal contours are within normal limits. Both lungs are clear. The visualized skeletal structures are unremarkable.  IMPRESSION: No active disease.   Electronically Signed   By: Ellery Plunk M.D.   On: 08/24/2014 00:56   Ct Head Wo Contrast  08/24/2014   CLINICAL DATA:  Witnessed seizure.  EXAM: CT HEAD WITHOUT CONTRAST  TECHNIQUE: Contiguous axial images were obtained from the base of the skull through the vertex without intravenous contrast.  COMPARISON:  06/27/2013  FINDINGS: No intracranial hemorrhage, mass effect, or midline shift. Incidental note of septum cavum pellucidum, unchanged. No hydrocephalus. The basilar cisterns are patent. No evidence of territorial infarct. No intracranial fluid collection. Calvarium is intact. Included paranasal sinuses and mastoid air cells are well aerated.  IMPRESSION: No acute intracranial abnormality.   Electronically Signed   By: Rubye Oaks M.D.   On: 08/24/2014 00:31   Mr Laqueta Jean ZO Contrast  08/24/2014   CLINICAL DATA:  Personal history of autism with no onset generalized seizure, tonic  clonic.  EXAM: MRI HEAD WITHOUT AND WITH CONTRAST  TECHNIQUE: Multiplanar, multiecho pulse sequences of the brain and surrounding structures were obtained without and with intravenous contrast.  CONTRAST:  10mL MULTIHANCE GADOBENATE DIMEGLUMINE 529 MG/ML IV SOLN  COMPARISON:  Head CT same day and 06/27/2013.  FINDINGS: Diffusion imaging does not show any acute infarction or other cause of restricted diffusion. The brainstem and cerebellum are normal. The cerebral hemispheres do not show any evidence of old small or large vessel infarction, mass lesion, hemorrhage, or hydrocephalus. Mesial temporal lobes are normal. After contrast administration, no abnormal enhancement occurs.  The patient does have a cavum septum pellucidum which appears somewhat more dilated than was seen on the study of 06/27/2013. Therefore, this could be an actual expanding cyst of the septum pellucidum which can be symptomatic and associated with certain behavioral disturbances. I am not aware of an association with seizures. Nonetheless, neuro surgical evaluation is warranted, not necessarily emergently however. Certainly this does not appear to be resulting in obstruction or trapping of the lateral ventricles.  IMPRESSION: Normal appearance of the brain parenchyma itself. Expanding cyst of the septum pellucidum. Transverse diameter in the frontal horn region is 20 mm now as opposed 16 mm on 06/27/2013. The significance is indeterminate, but this can be associated with clinical abnormalities including behavioral disturbances.   Electronically Signed   By: Paulina Fusi M.D.   On: 08/24/2014 08:01    Micro Results      No results found for this or any previous visit (from the past 240 hour(s)).     Today   Subjective    Jonathan [name] today in bed non verbal at baseline, in no distress   Objective   Blood pressure 126/76, pulse 83, temperature 98.5 F (36.9 C), temperature source Oral, resp. rate 20, height 5' 10.8" (1.798  m), weight 47.356 kg (104 lb 6.4 oz), SpO2 100 %.   Intake/Output Summary (Last 24 hours) at 08/25/14 0849 Last data filed at 08/25/14 0755  Gross per 24 hour  Intake    600 ml  Output    700 ml  Net   -100 ml    Exam Awake , Non verbal at baseline, No new F.N deficits, Normal affect [place].AT,PERRAL Supple Neck,No JVD, No cervical lymphadenopathy appriciated.  Symmetrical Chest wall movement, Good air movement bilaterally, CTAB RRR,No Gallops,Rubs or new Murmurs, No Parasternal Heave +ve B.Sounds, Abd Soft, Non tender, No organomegaly appriciated, No rebound -guarding or rigidity. No Cyanosis, Clubbing or edema, No new Rash or bruise   Data Review   CBC w Diff: Lab Results  Component Value Date   WBC 9.5 08/24/2014   HGB 14.0 08/24/2014   HCT 40.1 08/24/2014   PLT 180 08/24/2014   LYMPHOPCT 10* 08/24/2014   MONOPCT 8 08/24/2014   EOSPCT 0 08/24/2014   BASOPCT 0 08/24/2014    CMP: Lab Results  Component Value Date   NA 141 08/24/2014   K 4.1 08/24/2014   CL 106 08/24/2014   CO2 28 08/24/2014   BUN 8 08/24/2014   CREATININE 0.81 08/24/2014   CREATININE 0.64 07/13/2012   PROT 7.6 08/24/2014   ALBUMIN 4.2 08/24/2014   BILITOT 0.7 08/24/2014   ALKPHOS 68 08/24/2014   AST 30 08/24/2014   ALT 16* 08/24/2014  .   Total Time in preparing paper work, data evaluation and todays exam - 35 minutes  Leroy Sea M.D on 08/25/2014 at 8:49 AM  [facility]   Office  [phone]

## 2014-08-26 LAB — URINE CULTURE: Culture: >=100000

## 2014-09-01 ENCOUNTER — Encounter: Payer: Self-pay | Admitting: Neurology

## 2014-09-01 ENCOUNTER — Ambulatory Visit (INDEPENDENT_AMBULATORY_CARE_PROVIDER_SITE_OTHER): Payer: Medicare Other | Admitting: Neurology

## 2014-09-01 VITALS — BP 118/88 | HR 88 | Resp 20 | Ht 69.0 in | Wt 108.0 lb

## 2014-09-01 DIAGNOSIS — F840 Autistic disorder: Secondary | ICD-10-CM

## 2014-09-01 DIAGNOSIS — G40901 Epilepsy, unspecified, not intractable, with status epilepticus: Secondary | ICD-10-CM

## 2014-09-01 DIAGNOSIS — G40301 Generalized idiopathic epilepsy and epileptic syndromes, not intractable, with status epilepticus: Secondary | ICD-10-CM | POA: Diagnosis not present

## 2014-09-01 MED ORDER — LEVETIRACETAM 500 MG PO TABS: 500.0000 mg | ORAL_TABLET | Freq: Two times a day (BID) | ORAL | Status: DC

## 2014-09-01 NOTE — Progress Notes (Signed)
Provider:  Melvyn Novas, M D  Referring Provider: Maree Erie, MD Primary Care Physician:  Maree Erie, MD  Chief Complaint  Patient presents with  . Seizures    new problem of seizures, rm 10, grandmother and community caregiver present  severe mental retardation, autism, severe disability.  HPI:  Jonathan [name] is a 19 y.o. male . seen here as a referral from PCP, Dr. [name] for neurological seizure evaluation,  Jonathan [name] carries a diagnosis of autism with severe cognitive and intellectual delay. He had never had seizures until July 27 when his mother witnessed a generalized seizure. With an arrival time at the ER he developed a second seizure which was witnessed by staff. The first seizure at home lasted for probably less than a minute as tonic-clonic convulsions but he did not lose consciousness following the seizure he appeared postictal and very drowsy. The patient had a similar event was in 2 hours later in the emergency room witnessed by a PA. The on-call neurologist decided to give Her loading dose IV. The patient was brought in for CT of the head which did not show any acute changes there was no evidence of a bleed, infection, swelling. Had not been back to baseline before the second seizure occurred so this would be considered a status epilepticus. The patient is usually noncommunicative and nonverbal due to his autism. His primary care physician is Dr. [name] I asked his mother who was attending this visit if we can obtain appropriate previous diagnostic records and test results regarding Jonathan [name] health. Apparently he was tested in [place] where he was born and diagnosed with cerebral palsy, later worked up for dysphagia and chronic constipation. The patient is very slender and appears younger than his American age.  His grandmother moved him  to the [place] area but he has seen physicians at [facility] , Dr Christance(?)and [name], GI, and seen  in  [place],  [place].  Jonathan [name] had a dramatic birth history. His mother suffered from a sepsis during pregnancy and died after meningitis and she was brain dead but left on life support measures as she was pregnant Jonathan [name] was taken from the warm at gestational age the seventh month of pregnancy. His mother had been comatose since the fifth week of pregnancy. He weighted 2 pounds and 2 ounces and  fit into the hand of his father . He stayed for a long time in the NICU until released. At age [age] year he suffered a bout of pneumonia. He never became fully verbal he is usually nonverbal but produces sounds that he repeats continuously. He learned crawling at 9-10 months ,walking at 14 months but was always tiptoeing.   Jonathan [name] has is baseline some visual impairment but I don't know how to graded or Holter test and given his low ability to cooperate with a vision test / visual field test.    Review of Systems: Out of a complete 14 system review, the patient complains of only the following symptoms, and all other reviewed systems are negative.; autism , non verbal, MRDD -  History   Social History  . Marital Status: Single    Spouse Name: N/A  . Number of Children: N/A  . Years of Education: N/A   Occupational History  . Not on file.   Social History Main Topics  . Smoking status: Never Smoker   . Smokeless tobacco: Not on file  . Alcohol Use: No  .  Drug Use: No  . Sexual Activity: No   Other Topics Concern  . Not on file   Social History Narrative    Family History  Problem Relation Age of Onset  . Obesity Other   . Stroke Other   . Hypertension Other   . Arthritis Paternal Grandmother   . Seizures Maternal Uncle     Past Medical History  Diagnosis Date  . Autism   . Seizure 08/24/2014  . Mental retardation, idiopathic severe     History reviewed. No pertinent past surgical history.  Current Outpatient Prescriptions  Medication Sig Dispense Refill  . cetirizine  (ZYRTEC) 1 MG/ML syrup Take 10 mls (10 mg) by mouth daily at bedtime as needed for allergy symptom control 240 mL 6  . levETIRAcetam (KEPPRA) 500 MG tablet Take 1 tablet (500 mg total) by mouth 2 (two) times daily. 60 tablet 2  . polyethylene glycol powder (GLYCOLAX/MIRALAX) powder Mix one capful in 8 ounces of liquid and drink daily as needed for relief of constipation 255 g 6  . risperiDONE (RISPERDAL) 1 MG/ML oral solution TAKE 0.5 MLS BY MOUTH DAILY AT BEDTIME FOR CONTROL OF AGGITATION 30 mL 11   No current facility-administered medications for this visit.    Allergies as of 09/01/2014  . (No Known Allergies)    Vitals: BP 118/88 mmHg  Pulse 88  Resp 20  Ht 5\' 9"  (1.753 m)  Wt 108 lb (48.988 kg)  BMI 15.94 kg/m2 Last Weight:  Wt Readings from Last 1 Encounters:  09/01/14 108 lb (48.988 kg)   Last Height:   Ht Readings from Last 1 Encounters:  09/01/14 5\' 9"  (1.753 m)    Physical exam:  General: The patient is awake, alert and appears not in acute distress. The patient is well groomed. Head: Normocephalic, atraumatic. Neck is supple. Mallampati 2, neck circumference:13  Cardiovascular:  Regular rate and rhythm , without  murmurs or carotid bruit, and without distended neck veins. Respiratory: Lungs are clear to auscultation. Skin:  Without evidence of edema, or rash Trunk: BMI is normal posture.  Neurologic exam : The patient is awake and alert, oriented to place and time.  Memory subjective described as intact.  There is a normal attention span & concentration ability. Speech is absent , no words are formed.non verbal repetition.   Mood and affect are aloof .  Cranial nerves: Pupils are equal and briskly reactive to light. Funduscopic exam without evidence of pallor or edema.  Extraocular movements  in vertical and horizontal planes intact and without nystagmus.  Visual fields by finger perimetry are intact. Hearing to finger rub intact.  Facial sensation appers  intact to fine touch. Facial motor strength is symmetric, but he moves more on the right. The tongue and uvula move midline.  He is incessantly moving. Tongue protrusion was not provided. He has well formed muscle bulk for the muscles of mastication.. Shoulder shrug is normal.   Motor exam:   Sensory:  Fine touch, pinprick and vibration ; the patient cannot assist with this exam. Coordination: Rapid alternating movements in the fingers/hands were not tested due to autism, observation - he moves his left hand much less than his right and hazardous fisted hand he also places his thumb between the index and middle finger. He has a pronation at baseline also speaks for an early childhood or interpregnancy injury and follow for cerebral palsy. The left leg muscles have less bulk than the right especially at the thigh. His left  quadriceps is smaller  than his right .Finger-to-nose maneuver can not be performed. Gait and station: Patient walks without assistive device and is able unassisted to climb up to the exam table.  Strength within normal limits. Stance is stable and normal. Tandem gait is unfragmented. Romberg testing isnegative   Deep tendon reflexes: in the  upper and lower extremities are asymmetric , left brisk with 2 beat clonus at ankle.   Babinski maneuver response is down going right and intestable left.    Assessment:  After physical and neurologic examination, review of laboratory studies, imaging, neurophysiology testing and pre-existing records, assessment of 60 minutes for this difficult patient in with  developmental delay, cerebral palsy and pervasive disorder.  We need additional information, given that hi e was referred by the Emergency room without past medical history access.  I reviewed the imaging studies obtained in the emergency room and the EEG. These are certainly somewhat impaired studies the EEG showed drowsiness and the patient was also partially asleep during the recording  this was done in the posterior dome beta activity was noted because he had already been given benzodiazepine set the time of the test epileptiform activity was not noted but he was under the influence of medication. In addition he had a chest x-ray which showed no aspiration.  CT was done without contrast.  There was no hydrocephalus, the basilar cisterns were patent and Dr. Sharyne Peach did not note any developmental abnormalities. An MRI of the head followed on 08-24-14 again no acute infarction the mesial temporal lobes appear normal and bilaterally well formed. There was no enhancement noticed postcontrast. He does have a cavum septum pellucidum but appears dilated. This could be an expanding cyst. Neurosurgical evaluation was recommended by Dr. Glenis Smoker the radiologist. The brain parenchyma appear normal to him. Please note that the patient was not febrile that he had an episode oxygenation a pulse rate of 83 and a blood pressure of 126/76 also speaks against sepsis or infectious origin for the seizure. He also had a normal CMP and CBC with differential on 08-24-14.      Plan:  Treatment plan and additional workup :  The patient is maintained on Risperdal which is treating mostly behavior issues he was placed on S the time as a metabolically safe medication that was easily IV administered. He did have 2 unexplained seizures since I was no bleed trauma or sepsis identified. However is underlying condition makes him prone to have epileptic seizures and seizures are more common in the autistic population as well as in the cerebral palsy population. I do not see a reason to change his medicine at this time if he were to have further seizures within the next 6 months I certainly want to see him immediately and would initiate a quart" stronger anti-epileptic medication but May Well Be Enough for Him at This Time.   I Would Advise His Grandmother Especially That It Can Cause Some Psychiatric Behavior Changes and  That Some Patients Become Depressed or Difficult to Guide on Keppra. 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 And He Seems to Be Quite Happy.  RV in 5-6 month , records ordered, PCP will receive note . Dr [name] .    Jonathan Mylar Tamaj Jurgens MD 09/01/2014

## 2014-09-01 NOTE — Patient Instructions (Signed)
Pervasive Developmental Disorder Not Otherwise Specified Pervasive developmental disorder not otherwise specified (PDD-NOS) is one of the autistic spectrum disorders. Autistic spectrum disorders is a group of psychological conditions characterized by abnormalities of social interactions and communication as well as limited interests and repetitive behavior. Children with PDD-NOS have delays in motor, language, and social skills. CAUSES  There are many causes of PDD-NOS, such as:  Lack of oxygen.  Head injury from trauma.  Hormonal imbalances.  Toxins such as lead.  Genetic and biochemical disorders of the body. SYMPTOMS  Symptoms of PDD-NOS include delays in all areas of development, such as:  Movement (motor) development:  Large muscle groups like the legs (gross motor skills).  Small muscle groups like the hands (fine motor skills).  Language development:  Delayed use of words.  Repetitive or unusual use of language.  Social development:  Interaction with other people.  Understanding social conventions such as personal space. Symptoms can differ in severity. Symptoms in many children improve with treatment or as they age. Some people with PDD-NOS eventually lead normal or near-normal lives. Adolescence can worsen behavior problems in some children or cause depression.  DIAGNOSIS  The diagnosis of PDD-NOS is based on clinical symptoms. Formal testing may be done to confirm the diagnosis. Once PDD-NOS is diagnosed, a health care provider may order the following tests to find out what caused the symptoms:  Thyroid tests, if none are done at birth.  Lead level tests.  Hearing tests.  Genetic and chemical tests.  If there are seizures, an electroencephalogram (EEG), a recording of brain waves, is obtained.  Neuroimaging tests may be done if there has been injury to the brain or there is a loss of developmental skills.  Ophthalmologic evaluation regarding biochemical or  genetic disorders. TREATMENT  There is no cure for PDD-NOS. The most effective treatments combine early and intensive therapies geared to the specific needs of the child. Treatment should be ongoing and reevaluated regularly. Treatment is a combination of:   Social therapy to help your child learn how to interact with others, especially other children.  Behavioral therapy to help your child cut back on obsessive interests and repetitive routines.  Medication to treat the symptoms of PDD-NOS, including:  Depression and anxiety.  Behavior or hyperactivity problems.  Seizures.  Coordination therapy.  Speech therapy.  Helping children with their oversensitivity to touch and other sensation. With treatment, most children with PDD-NOS and their families learn to cope with the symptoms. Social and personal relationships can continue to be challenging. Many adults with PDD-NOS have normal jobs. They may need ongoing family or group support. HOME CARE INSTRUCTIONS   Physical activities often help with coordination.  Children with PDD-NOS respond well to routines. Doing things like cooking, eating, and cleaning at the same time each day works best.  Meeting with teachers and school counselors often helps to ensure progress in school.  Children with PDD-NOS may realize that they are different and may become sad or upset. Counseling and sometimes medication may be needed for issues such as depression and anxiety. SEEK MEDICAL CARE IF:   Your child has new symptoms that worry you.  Your child has reactions to medicines.  Your child has convulsions. Look for:  Jerking and twitching.  Sudden falls for no reason.  Lack of response.  Dazed behavior for brief periods.  Staring.  Rapid blinking.  Unusual sleepiness.  Irritability when waking.  Your older child is depressed. Symptoms include:  Unusual sadness.  Decreased appetite.  Weight loss.  Lack of interest in things  that are normally enjoyed.  Poor sleep.  Your older child has signs of anxiety. Symptoms include:  Excessive worry.  Restlessness.  Irritability.  Trembling.  Difficulty with sleeping. Document Released: 01/04/2002 Document Revised: 05/31/2013 Document Reviewed: 06/14/2009 [facility] Patient Information 2015 [place], [place]. This information is not intended to replace advice given to you by your health care provider. Make sure you discuss any questions you have with your health care provider.

## 2014-09-02 ENCOUNTER — Ambulatory Visit: Payer: Medicare Other | Admitting: Pediatrics

## 2014-09-07 ENCOUNTER — Telehealth: Payer: Self-pay | Admitting: Pediatrics

## 2014-09-07 NOTE — Telephone Encounter (Signed)
Called and reached voice & name identified voice mail for Ms. Jonathan [name]. Left message that I called to request she or Wasyl's assistant stop by the office for a sterile container to collect a urine specimen and bring it to his office visit on 8/12. Need urine to follow-up on UTI. Left office number in the event GM has questions.

## 2014-09-09 ENCOUNTER — Ambulatory Visit (INDEPENDENT_AMBULATORY_CARE_PROVIDER_SITE_OTHER): Payer: Medicare Other | Admitting: Pediatrics

## 2014-09-09 ENCOUNTER — Encounter: Payer: Self-pay | Admitting: Pediatrics

## 2014-09-09 VITALS — BP 110/82 | Wt 109.4 lb

## 2014-09-09 DIAGNOSIS — F840 Autistic disorder: Secondary | ICD-10-CM | POA: Diagnosis not present

## 2014-09-09 DIAGNOSIS — N390 Urinary tract infection, site not specified: Secondary | ICD-10-CM

## 2014-09-09 DIAGNOSIS — Z0289 Encounter for other administrative examinations: Secondary | ICD-10-CM

## 2014-09-09 DIAGNOSIS — Z68.41 Body mass index (BMI) pediatric, less than 5th percentile for age: Secondary | ICD-10-CM

## 2014-09-09 DIAGNOSIS — Z0001 Encounter for general adult medical examination with abnormal findings: Secondary | ICD-10-CM

## 2014-09-09 NOTE — Progress Notes (Signed)
Routine Well-Adolescent Visit  PCP: Maree Erie, MD   History was provided by the paternal grandmother, with whom he lives, and his personal care assistant Jonathan [name].  Jonathan [name] is a 19 y.o. male who is here for his annual physical and wellness exam.  Current concerns: he was seen in the ED about 2.5 weeks ago due to new onset seizure x 2 with resultant 2 night hospitalization. Tests revealed enterococcus in his urine specimen but no other findings supportive of UTI on urinalysis and he was not treated. No precipitant for seizure found beyond his increased risk due to his CP and autism. He has been seen by Neurology for the seizures and is due for follow-up in 5-6 months. Keppra 500 mg bid has been prescribed and no change in his Risperidone for management of behavior.  Grandmother states he is back to his baseline and doing well.  Adolescent Assessment:  Confidentiality was discussed with the patient and if applicable, with caregiver as well.  Home and Environment:  Lives with: lives at home with his paternal grandmother. Parental relations: father assists in providing for Jonathan [name] but does not live with him or serve as caretaker. Friends/Peers: relates well with his attendant and is easy going at school. He does not speak or engage in usual social interaction Nutrition/Eating Behaviors: good appetite and does not require special food preparation; he needs observation to prevent overfilling his mouth and proper handling of cups, utensils. Sports/Exercise:  Walks a Insurance underwriter and Employment:  School Status: he is in special education classes at [employer] with plans to remain in attendance until age 7 years. Receives physical therapy at school. School History: special class setting due to severe cognitive delay, physical limitations and autism. Work: does not work Activities: enjoys going out to eat with attendant and family; attends some family  gatherings.  Regular dental care with Dr. Allison [name]. He has much dental work with caps and an implant due to his bruxism.  With parent out of the room and confidentiality discussed: GM and attendant remained in room.  Patient reports being comfortable and safe at school and at home? GM reports comfort in his school placement.  Smoking: no Secondhand smoke exposure? no Drugs/EtOH: none   Menstruation:   Menarche: not applicable in this male child.   Sexuality: questions not applicable due to his cognitive limitations Sexually active? no contact known sexual partners in last year: none known contraception use: abstinence Last STI Screening: none; he is incontinent and urine specimen is not obtainable in the office without craterization.  Violence/Abuse: none known Mood: Suicidality and Depression: not apparent Weapons: none  Screenings: The patient completed the Rapid Assessment for Adolescent Preventive Services screening questionnaire and the following topics were identified as risk factors and discussed: unable to do screning due to his limitations. GM and attendant voiced no risk factors beyond known vulnerabliity of special citizen; stated he is in safe environment.  In addition, the following topics were discussed as part of anticipatory guidance sleep, nurtition, personal safety, physical therapy.Marland Kitchen  PHQ-9 not done  Physical Exam:  BP 110/82 mmHg  Wt 109 lb 6.4 oz (49.624 kg) Facility age limit for growth percentiles is 20 years.  General Appearance:   alert, oriented, no acute distress and slender but appears well nourished. Observed making repetitive oral clicking noise and rhythmically striking right hand against wall in exam room.  HENT: Normocephalic, no obvious abnormality, conjunctiva clear  Mouth:   Numerous metal dental  caps with intact top front incisors  Neck:   Supple; thyroid: no enlargement, symmetric, no tenderness/mass/nodules  Lungs:   Clear to auscultation  bilaterally, normal work of breathing  Heart:   Regular rate and rhythm, S1 and S2 normal, no murmurs;   Abdomen:   Soft, non-tender, no mass, or organomegaly  GU genitalia not examined due to difficulty undressing  Musculoskeletal:   Tone and strength strong and symmetrical, all extremities. He has slight contracture at right elbow, limiting ability to bring to neutral position. Tight heel cords and contraction resulting in weight bearing on forefoot bilaterally              Lymphatic:   No cervical adenopathy  Skin/Hair/Nails:   Skin warm, dry and intact, no rashes, no bruises or petechiae  Neurologic:   Strength and muscle tone is good.    Assessment/Plan: 1. Encounter for other specified administrative purpose   2. Body mass index, pediatric, less than 5th percentile for age   37. Autism spectrum disorder associated with neurodevelopmental, mental or behavioral disorder, requiring substantial support (level 2)   4. Urinary tract infection without hematuria, site unspecified   Urine culture may have been positive due to contamination due to his incontinence and needs for disposable undergarments.  Mild contracture at right elbow to be addressed with continued PT. Addressed referral to rehabilitation specialist which GM declined; not interested in Botox, tendon release or other procedures to lower extremities citing poor success in the past and good adaptation to current status. States she may reconsider in the future.  BMI: is not appropriate for age based on appearance; unable to get reliable height.  Immunizations today: none indicated today; advised influenza vaccine for the fall.  Provided urine hat and container for home collection of specimen to recheck UA and perform screening.  No changes in medication; advised to keep Neurology Clinic appointment in January 2017 as scheduled.  - Follow-up visit in 6 months for next visit, or sooner as needed. Discussed plan to transition to  adult care at [facility] at age 31 years.[name], Etta Quill, MD

## 2014-09-09 NOTE — Patient Instructions (Signed)
Autism Spectrum Disorder °Autism spectrum disorder (ASD) is a neurodevelopmental disorder that starts in early childhood and is a lifelong problem. It is recognized by early onset of challenges a person has with communication, social interactions, and certain types of repeated behaviors. This disorder is also recognized by the effect these challenges have on daily activities. People living with ASD may also have other challenges, such as learning disabilities. °Autism spectrum disorder usually becomes noticeable during early childhood. Some aspects of ASD are noticeable when a child is 9-12 months old. Most often, the challenges associated with this disorder are seen between the child's first and second birthdays (12-24 months old). The first signs of ASD are often seen by family members or health care providers. These signs are slow language development and a lack of interest in interacting with other people. Often after the child's first birthday, other aspects of ASD become noticeable. These include certain repeated behaviors and lack of typical play for the child's age. In most cases, people with ASD continue to learn how to cope with their disorder as they grow older.  °SIGNS AND SYMPTOMS  °There can be many different signs and symptoms of ASD, including: °· Challenges with social communication and interaction with others. °¨ Lack of interaction with other people. °¨ Unusual approaches to interacting with people. °¨ Lack of initiating (starting) social interactions with other people. °¨ Lack of desire or ability to maintain eye contact with other people. °¨ Lack of appropriate facial expressions. °¨ Lack of appropriate body language while interacting with others. °¨ Difficulty adjusting behavior when the situation calls for it. °¨ Difficulties sharing imaginative play with others. °¨ Difficulty making friends. °· Repeated behaviors, interests, or activities. °¨ Repeated movements like hand flapping, rocking  back and forth, or repeated head movements. °¨ Often arranging items in an order that he or she desires or finds comforting. °¨ Echoing what other people say. °¨ Always wanting things to be the same, such as eating the same foods, taking the same route to school or work, or following the same order of activities each day. °¨ Unusually strong attention to one thing or topic of interest. °¨ Unusually strong or mild responses to experiences such as pain or extreme temperatures, touching certain textures, or smelling certain scents. °Autism spectrum disorder occurs at different levels: °· Level 1 is the least severe form of ASD. When supportive treatments are in place, most aspects of level 1 are difficult to notice. People at this level: °¨ May speak in full sentences. °¨ Usually have no repetitive behaviors. °¨ May have trouble switching between activities. °¨ May have trouble starting interactions or friendships with others. °· Level 2 is a moderate form of ASD. At this level, challenges may be seen even when supportive treatments are in place. People at this level: °¨ Speak in simple sentences. °¨ Have difficulty coping with change. °¨ Only interact with others around specific, shared interests. °¨ Have unusual nonverbal communication skills. °¨ Have repeated behaviors that sometimes interfere with daily activities. °· Level 3 is the most severe form of ASD. Challenges at this level can interfere with daily life even when supportive treatments are in place. People at this level may: °¨ Speak in very few understandable words. °¨ Interact with others awkwardly and not very often. °¨ Have trouble coping with change. °¨ Have repeated behaviors that occur often and get in the way of their daily activities. °Depending on the level of severity, some people are able to lead   normal or near-normal lives. Adolescence can worsen behavior problems in some children. Teenagers with ASD may become depressed. Some children develop  convulsions (seizures). People with ASD have a normal life span. °DIAGNOSIS  °The diagnosis of ASD is often a two-stage process. The first stage may occur during a checkup. Health care providers look for several signs. Early signs that your child's health care provider should look for during the 9-, 12-, and 15-month well-child visits include: °· Lack of interest in other people, including adults and children. °· Avoiding eye contact with others. °· Inability to pay attention to something along with another person (impaired joint attention). °· Not responding when his or her name is called. °· Lack of pointing out or showing objects to another person. °The second stage of diagnosis consists of in-depth screening by a team of specialists like psychologists, psychiatrists, neurologists, and speech therapists. This team of health care providers will perform tests such as: °· Testing of your child's brain functions (neurologic testing). °· Genetic testing. °· Knowledge and language testing. °· Verbal and nonverbal communication skills. °· Ability to perform tasks on his or her own. °TREATMENT  °There is no single best treatment for ASD. While there is no cure, treatment helps to decrease how severe symptoms are and how much they interfere with a person's daily life. The best programs combine early and intensive treatment that is specific to the individual. Treatment should be ongoing and re-evaluated regularly. It is usually a combination of:  °· Social skills training. This teaches the person with ASD to interact better with others. Parents can set an example of good behavior for their children and teach them how to recognize social cues. °· Behavioral therapy. This can help to cut back on obsessive interests, emotional problems, and repetitive routines and behaviors. °· Medicines. These may be used to treat depression and anxiety that sometimes occur alongside this disorder. Medicine may also be used for behavior or  hyperactivity problems. Seizures can be treated with medicine. °· Physical therapy. This helps with poor coordination of the large muscles. Taking part in physical activities such as dance, gymnastics, or martial arts can also help. These activities allow the person to progress at his or her own pace, without the peer pressures found in team sports. °· Occupational therapy. This helps with poor coordination of smaller muscles, such as muscles in the hands. It can also help when exposure to certain sounds or textures are especially bothersome to the person with ASD. °· Speech therapy. This helps people who have trouble with their speech or conversations. °· Family training and support. This helps family members learn how to manage behavioral issues and to cope with other challenges. Older children and teenagers may become sad when they realize they are different because of their disorder. Parents should be prepared to empathize with their child when this occurs. Support groups can be helpful. °HOME CARE INSTRUCTIONS  °· Parents and family members need support to help deal with children with ASD. Support groups can often be found for families of ASD. °· Children with this disorder often need help with social skills. Parents may need to teach things like how to: °¨ Use eye contact. °¨ Respect other people's personal spaces. °· People with ASD respond well to routines for doing everyday things. Doing things like cooking, eating, or cleaning at the same time each day often works best. °· Parents, teachers, and school counselors should meet regularly to make sure that they are taking the same   approach with a child who has this disorder. Meeting often helps to watch for problems and progress at school. °· Teens and adults with ASD often need help as they work to become more independent. Support groups and counselors can provide encouragement and guidance on how to help a person with ASD expand his or her  independence. °· Make sure any medicines that are prescribed are taken regularly and as directed. °· Check with your health care provider before using any new prescription or over-the-counter medicines. °· Keep all follow-up appointments with your health care provider. °SEEK MEDICAL CARE IF:  °Seek medical care if the person with ASD: °· Has new symptoms that concern you. °· Has a reaction to prescribed medicines. °· Develops convulsions. Look for: °¨ Jerking and twitching. °¨ Sudden falls for no reason. °¨ Lack of response. °¨ Dazed behavior for brief periods. °¨ Staring. °¨ Rapid blinking. °¨ Unusual sleepiness. °¨ Irritability when waking. °· Is depressed. Watch for unusual sadness, decreased appetite, weight loss, lack of interest in things that are normally enjoyed, or poor sleep. °· Shows signs of anxiety. Watch for excessive worry, restlessness, irritability, trembling, or difficulty with sleep. °Document Released: 10/06/2001 Document Revised: 05/31/2013 Document Reviewed: 10/16/2012 °[employer]® Patient Information ©2015 [employer]. This information is not intended to replace advice given to you by your health care provider. Make sure you discuss any questions you have with your health care provider. ° °

## 2014-09-11 ENCOUNTER — Encounter: Payer: Self-pay | Admitting: Pediatrics

## 2014-10-17 DIAGNOSIS — R569 Unspecified convulsions: Secondary | ICD-10-CM | POA: Diagnosis not present

## 2014-10-18 ENCOUNTER — Encounter (HOSPITAL_COMMUNITY): Payer: Self-pay | Admitting: *Deleted

## 2014-10-18 ENCOUNTER — Inpatient Hospital Stay (HOSPITAL_COMMUNITY): Payer: Medicare Other

## 2014-10-18 ENCOUNTER — Inpatient Hospital Stay (HOSPITAL_COMMUNITY)
Admission: EM | Admit: 2014-10-18 | Discharge: 2014-10-20 | DRG: 100 | Disposition: A | Discharge status: Home / Self Care | Payer: Medicare Other | Attending: Internal Medicine | Admitting: Internal Medicine

## 2014-10-18 DIAGNOSIS — R0902 Hypoxemia: Secondary | ICD-10-CM | POA: Diagnosis present

## 2014-10-18 DIAGNOSIS — I471 Supraventricular tachycardia: Secondary | ICD-10-CM | POA: Diagnosis not present

## 2014-10-18 DIAGNOSIS — Z8249 Family history of ischemic heart disease and other diseases of the circulatory system: Secondary | ICD-10-CM

## 2014-10-18 DIAGNOSIS — G40309 Generalized idiopathic epilepsy and epileptic syndromes, not intractable, without status epilepticus: Secondary | ICD-10-CM | POA: Diagnosis not present

## 2014-10-18 DIAGNOSIS — G40909 Epilepsy, unspecified, not intractable, without status epilepticus: Secondary | ICD-10-CM | POA: Diagnosis not present

## 2014-10-18 DIAGNOSIS — E876 Hypokalemia: Secondary | ICD-10-CM | POA: Diagnosis not present

## 2014-10-18 DIAGNOSIS — R059 Cough, unspecified: Secondary | ICD-10-CM

## 2014-10-18 DIAGNOSIS — R569 Unspecified convulsions: Secondary | ICD-10-CM | POA: Diagnosis not present

## 2014-10-18 DIAGNOSIS — R4182 Altered mental status, unspecified: Secondary | ICD-10-CM | POA: Diagnosis not present

## 2014-10-18 DIAGNOSIS — Z823 Family history of stroke: Secondary | ICD-10-CM

## 2014-10-18 DIAGNOSIS — R000 Tachycardia, unspecified: Secondary | ICD-10-CM | POA: Diagnosis present

## 2014-10-18 DIAGNOSIS — G40409 Other generalized epilepsy and epileptic syndromes, not intractable, without status epilepticus: Principal | ICD-10-CM | POA: Diagnosis present

## 2014-10-18 DIAGNOSIS — F840 Autistic disorder: Secondary | ICD-10-CM | POA: Diagnosis not present

## 2014-10-18 DIAGNOSIS — E872 Acidosis, unspecified: Secondary | ICD-10-CM | POA: Diagnosis present

## 2014-10-18 DIAGNOSIS — R739 Hyperglycemia, unspecified: Secondary | ICD-10-CM

## 2014-10-18 DIAGNOSIS — F79 Unspecified intellectual disabilities: Secondary | ICD-10-CM | POA: Diagnosis present

## 2014-10-18 DIAGNOSIS — R05 Cough: Secondary | ICD-10-CM | POA: Diagnosis not present

## 2014-10-18 DIAGNOSIS — J690 Pneumonitis due to inhalation of food and vomit: Secondary | ICD-10-CM | POA: Diagnosis present

## 2014-10-18 LAB — LACTIC ACID, PLASMA
LACTIC ACID, VENOUS: 1.4 mmol/L (ref 0.5–2.0)
LACTIC ACID, VENOUS: 1.3 mmol/L (ref 0.5–2.0)

## 2014-10-18 LAB — COMPREHENSIVE METABOLIC PANEL
ALBUMIN: 3.6 g/dL (ref 3.5–5.0)
ALBUMIN: 4.4 g/dL (ref 3.5–5.0)
ALK PHOS: 81 U/L (ref 38–126)
ALT: 13 U/L — ABNORMAL LOW (ref 17–63)
ALT: UNDETERMINED U/L (ref 17–63)
ANION GAP: 12 (ref 5–15)
AST: 27 U/L (ref 15–41)
AST: UNDETERMINED U/L (ref 15–41)
Alkaline Phosphatase: 72 U/L (ref 38–126)
Anion gap: 9 (ref 5–15)
BILIRUBIN TOTAL: UNDETERMINED mg/dL (ref 0.3–1.2)
BUN: 11 mg/dL (ref 6–20)
BUN: 9 mg/dL (ref 6–20)
CALCIUM: 9.2 mg/dL (ref 8.9–10.3)
CHLORIDE: 109 mmol/L (ref 101–111)
CO2: 23 mmol/L (ref 22–32)
CO2: 24 mmol/L (ref 22–32)
CREATININE: 0.86 mg/dL (ref 0.61–1.24)
Calcium: 8.5 mg/dL — ABNORMAL LOW (ref 8.9–10.3)
Chloride: 106 mmol/L (ref 101–111)
Creatinine, Ser: 0.86 mg/dL (ref 0.61–1.24)
GFR calc Af Amer: >60 mL/min (ref >60)
GFR calc Af Amer: >60 mL/min (ref >60)
GFR calc non Af Amer: >60 mL/min (ref >60)
GLUCOSE: 176 mg/dL — AB (ref 65–99)
GLUCOSE: 77 mg/dL (ref 65–99)
POTASSIUM: 4.1 mmol/L (ref 3.5–5.1)
POTASSIUM: 4.2 mmol/L (ref 3.5–5.1)
SODIUM: 141 mmol/L (ref 135–145)
Sodium: 142 mmol/L (ref 135–145)
Total Bilirubin: 0.3 mg/dL (ref 0.3–1.2)
Total Protein: 6.8 g/dL (ref 6.5–8.1)
Total Protein: 7.2 g/dL (ref 6.5–8.1)

## 2014-10-18 LAB — I-STAT CG4 LACTIC ACID, ED
Lactic Acid, Venous: 2.68 mmol/L (ref 0.5–2.0)
Lactic Acid, Venous: 5.39 mmol/L (ref 0.5–2.0)

## 2014-10-18 LAB — I-STAT CHEM 8, ED
BUN: 14 mg/dL (ref 6–20)
CHLORIDE: 103 mmol/L (ref 101–111)
Calcium, Ion: 1.15 mmol/L (ref 1.12–1.23)
Creatinine, Ser: 0.8 mg/dL (ref 0.61–1.24)
Glucose, Bld: 174 mg/dL — ABNORMAL HIGH (ref 65–99)
HEMATOCRIT: 43 % (ref 39.0–52.0)
Hemoglobin: 14.6 g/dL (ref 13.0–17.0)
Potassium: 4.1 mmol/L (ref 3.5–5.1)
SODIUM: 142 mmol/L (ref 135–145)
TCO2: 25 mmol/L (ref 0–100)

## 2014-10-18 LAB — CBC
HCT: 39.5 % (ref 39.0–52.0)
Hemoglobin: 13.7 g/dL (ref 13.0–17.0)
MCH: 28.9 pg (ref 26.0–34.0)
MCHC: 34.7 g/dL (ref 30.0–36.0)
MCV: 83.3 fL (ref 78.0–100.0)
Platelets: 217 10*3/uL (ref 150–400)
RBC: 4.74 MIL/uL (ref 4.22–5.81)
RDW: 13.7 % (ref 11.5–15.5)
WBC: 10.2 10*3/uL (ref 4.0–10.5)

## 2014-10-18 LAB — I-STAT ARTERIAL BLOOD GAS, ED
Acid-Base Excess: 5 mmol/L — ABNORMAL HIGH (ref 0.0–2.0)
BICARBONATE: 28.3 meq/L — AB (ref 20.0–24.0)
Bicarbonate: 31.4 mEq/L — ABNORMAL HIGH (ref 20.0–24.0)
O2 Saturation: 100 %
O2 Saturation: 64 %
PCO2 ART: 53 mmHg — AB (ref 35.0–45.0)
PCO2 ART: 58.2 mmHg — AB (ref 35.0–45.0)
PO2 ART: 419 mmHg — AB (ref 80.0–100.0)
Patient temperature: 97.2
TCO2: 30 mmol/L (ref 0–100)
TCO2: 33 mmol/L (ref 0–100)
pH, Arterial: 7.291 — ABNORMAL LOW (ref 7.350–7.450)
pH, Arterial: 7.377 (ref 7.350–7.450)
pO2, Arterial: 36 mmHg — CL (ref 80.0–100.0)

## 2014-10-18 LAB — CBC WITH DIFFERENTIAL/PLATELET
Basophils Absolute: 0.1 10*3/uL (ref 0.0–0.1)
Basophils Relative: 0 %
EOS ABS: 0 10*3/uL (ref 0.0–0.7)
EOS PCT: 0 %
HCT: 41.4 % (ref 39.0–52.0)
Hemoglobin: 14.4 g/dL (ref 13.0–17.0)
LYMPHS ABS: 1.7 10*3/uL (ref 0.7–4.0)
LYMPHS PCT: 12 %
MCH: 28.9 pg (ref 26.0–34.0)
MCHC: 34.8 g/dL (ref 30.0–36.0)
MCV: 83 fL (ref 78.0–100.0)
MONO ABS: 1 10*3/uL (ref 0.1–1.0)
Monocytes Relative: 7 %
Neutro Abs: 11.3 10*3/uL — ABNORMAL HIGH (ref 1.7–7.7)
Neutrophils Relative %: 81 %
PLATELETS: 177 10*3/uL (ref 150–400)
RBC: 4.99 MIL/uL (ref 4.22–5.81)
RDW: 14.3 % (ref 11.5–15.5)
WBC: 14.1 10*3/uL — AB (ref 4.0–10.5)

## 2014-10-18 LAB — MRSA PCR SCREENING: MRSA BY PCR: NEGATIVE

## 2014-10-18 MED ORDER — SODIUM CHLORIDE 0.9 % IV SOLN
750.0000 mg | Freq: Two times a day (BID) | INTRAVENOUS | Status: DC
  Administered 2014-10-18 – 2014-10-19 (×2): 750 mg via INTRAVENOUS
  Filled 2014-10-18 (×3): qty 7.5

## 2014-10-18 MED ORDER — ACETAMINOPHEN 325 MG PO TABS: 650.0000 mg | ORAL_TABLET | Freq: Four times a day (QID) | ORAL | Status: DC | PRN

## 2014-10-18 MED ORDER — SODIUM CHLORIDE 0.9 % IV SOLN
1.5000 g | Freq: Four times a day (QID) | INTRAVENOUS | Status: DC
  Administered 2014-10-18 – 2014-10-20 (×10): 1.5 g via INTRAVENOUS
  Filled 2014-10-18 (×15): qty 1.5

## 2014-10-18 MED ORDER — RISPERIDONE 1 MG/ML PO SOLN
0.5000 mg | Freq: Every day | ORAL | Status: DC
  Administered 2014-10-18 – 2014-10-19 (×2): 0.5 mg via ORAL
  Filled 2014-10-18 (×4): qty 0.5

## 2014-10-18 MED ORDER — LORAZEPAM 2 MG/ML IJ SOLN
1.0000 mg | Freq: Once | INTRAMUSCULAR | Status: AC
  Administered 2014-10-18: 1 mg via INTRAVENOUS

## 2014-10-18 MED ORDER — CETIRIZINE HCL 1 MG/ML PO SYRP: 10.0000 mg | ORAL_SOLUTION | Freq: Every day | ORAL | Status: DC

## 2014-10-18 MED ORDER — ONDANSETRON HCL 4 MG PO TABS: 4.0000 mg | ORAL_TABLET | Freq: Four times a day (QID) | ORAL | Status: DC | PRN

## 2014-10-18 MED ORDER — SODIUM CHLORIDE 0.9 % IV SOLN
INTRAVENOUS | Status: AC
  Administered 2014-10-18 – 2014-10-19 (×2): via INTRAVENOUS

## 2014-10-18 MED ORDER — LORATADINE 10 MG PO TABS
10.0000 mg | ORAL_TABLET | Freq: Every day | ORAL | Status: DC
  Administered 2014-10-18 – 2014-10-19 (×2): 10 mg via ORAL
  Filled 2014-10-18 (×2): qty 1

## 2014-10-18 MED ORDER — ACETAMINOPHEN 650 MG RE SUPP
650.0000 mg | Freq: Four times a day (QID) | RECTAL | Status: DC | PRN
Start: 2014-10-18 — End: 2014-10-20

## 2014-10-18 MED ORDER — SODIUM CHLORIDE 0.9 % IV BOLUS (SEPSIS)
1000.0000 mL | Freq: Once | INTRAVENOUS | Status: AC
  Administered 2014-10-18: 1000 mL via INTRAVENOUS

## 2014-10-18 MED ORDER — LORAZEPAM 2 MG/ML IJ SOLN
INTRAMUSCULAR | Status: AC
  Filled 2014-10-18: qty 1

## 2014-10-18 MED ORDER — ONDANSETRON HCL 4 MG/2ML IJ SOLN: 4.0000 mg | Freq: Four times a day (QID) | INTRAMUSCULAR | Status: DC | PRN

## 2014-10-18 MED ORDER — ENOXAPARIN SODIUM 40 MG/0.4ML ~~LOC~~ SOLN
40.0000 mg | SUBCUTANEOUS | Status: DC
  Administered 2014-10-18 – 2014-10-19 (×2): 40 mg via SUBCUTANEOUS
  Filled 2014-10-18 (×2): qty 0.4

## 2014-10-18 MED ORDER — LEVETIRACETAM 500 MG/5ML IV SOLN
1000.0000 mg | Freq: Once | INTRAVENOUS | Status: AC
  Administered 2014-10-18: 1000 mg via INTRAVENOUS
  Filled 2014-10-18: qty 10

## 2014-10-18 NOTE — Progress Notes (Signed)
ANTIBIOTIC CONSULT NOTE - INITIAL  Pharmacy Consult for Unasyn Indication: rule out pneumonia  No Known Allergies  Patient Measurements: Height:  (175.3 cm) Weight: 112 lb (50.803 kg) IBW/kg (Calculated) : 70.7 Adjusted Body Weight:   Vital Signs: Temp: 97.2 F (36.2 C) (09/20 0009) Temp Source: Axillary (09/20 0009) BP: 123/70 mmHg (09/20 0415) Pulse Rate: 112 (09/20 0415) Intake/Output from previous day: 09/19 0701 - 09/20 0700 In: 1000 [I.V.:1000] Out: -  Intake/Output from this shift: Total I/O In: 1000 [I.V.:1000] Out: -   Labs:  Recent Labs  10/18/14 0014 10/18/14 0053  WBC 10.2  --   HGB 13.7 14.6  PLT 217  --   CREATININE 0.86 0.80   Estimated Creatinine Clearance: 105.8 mL/min (by C-G formula based on Cr of 0.8). No results for input(s): VANCOTROUGH, VANCOPEAK, VANCORANDOM, GENTTROUGH, GENTPEAK, GENTRANDOM, TOBRATROUGH, TOBRAPEAK, TOBRARND, AMIKACINPEAK, AMIKACINTROU, AMIKACIN in the last 72 hours.   Microbiology: No results found for this or any previous visit (from the past 720 hour(s)).  Medical History: Past Medical History  Diagnosis Date  . Autism   . Seizure 08/24/2014  . Mental retardation, idiopathic severe     Medications:   (Not in a hospital admission) Scheduled:   Infusions:  . ampicillin-sulbactam (UNASYN) IV    . levETIRAcetam     Assessment: 19yo male with history of autism, seizure d/o and idiopathic mental retardation presents with seizure. Pharmacy is consulted to dose unasyn for aspiration pneumonia. Pt is afebrile, WBC 10.2, sCr 0.8.  Goal of Therapy:  Eradication of infection  Plan:  Unasyn 1.5g IV q6h Follow up culture results, renal function and clinical course  Arlean Hopping. Newman Pies, PharmD Clinical Pharmacist Pager [phone] 10/18/2014,4:54 AM

## 2014-10-18 NOTE — Progress Notes (Signed)
[facility] PROGRESS NOTE  Jonathan [name] ZOX:[id number] DOB: [birth year] DOA: 10/18/2014 PCP: Maree Erie, MD  Assessment/Plan: 1. Recurrent seizures 1. Neurology consulted 2. Pt given loading dose of keppra, usual dose increased from  to  BID 3. EEG without active seizures 4. Stable thus far 2. Lactic acidosis 1. Likely secondary to recurrent seizure activity 2. Trending down 3. Likely aspiration pneumonia 1. Infiltrate noted on CXR, c/w aspiration PNA 2. Pt has been empirically started on unasyn 4. Autism 1. Seems stable at this time 5. DVT Prophylaxis 1. Lovenox subQ  Code Status: Full Family Communication: Pt in room Disposition Plan: Pending  Consultants:  Neurology  Procedures:    Antibiotics:  Unasyn 9/20>>>  HPI/Subjective: Unable to obtain as pt is non-verbal  Objective: Filed Vitals:   10/18/14 1400 10/18/14 1415 10/18/14 1430 10/18/14 1445  BP: 111/70 111/70 111/70 110/70  Pulse:      Temp:      TempSrc:      Resp: Height:      Weight:      SpO2:        Intake/Output Summary (Last 24 hours) at 10/18/14 1602 Last data filed at 10/18/14 0606  Gross per 24 hour  Intake   1050 ml  Output      0 ml  Net   1050 ml   Filed Weights   10/18/14 0009  Weight: 50.803 kg (112 lb)    Exam:   General:  Awake, in nad  Cardiovascular: regular, s1, s2  Respiratory: normal resp effort, no wheezing  Abdomen: soft, nondistended  Musculoskeletal: perfused, no clubbing   Data Reviewed: Basic Metabolic Panel:  Recent Labs Lab 10/18/14 0014 10/18/14 0053  NA 141 142  K 4.2 4.1  CL 106 103  CO2 23  --   GLUCOSE 176* 174*  BUN 11 14  CREATININE 0.86 0.80  CALCIUM 9.2  --    Liver Function Tests:  Recent Labs Lab 10/18/14 0014  AST 27  ALT 13*  ALKPHOS 81  BILITOT 0.3  PROT 7.2  ALBUMIN 4.4   No results for input(s): LIPASE, AMYLASE in the last 168 hours. No results for input(s): AMMONIA in the  last 168 hours. CBC:  Recent Labs Lab 10/18/14 0014 10/18/14 0053  WBC 10.2  --   HGB 13.7 14.6  HCT 39.5 43.0  MCV 83.3  --   PLT 217  --    Cardiac Enzymes: No results for input(s): CKTOTAL, CKMB, CKMBINDEX, TROPONINI in the last 168 hours. BNP (last 3 results) No results for input(s): BNP in the last 8760 hours.  ProBNP (last 3 results) No results for input(s): PROBNP in the last 8760 hours.  CBG: No results for input(s): GLUCAP in the last 168 hours.  No results found for this or any previous visit (from the past 240 hour(s)).   Studies: Dg Chest Port 1 View  10/18/2014   CLINICAL DATA:  Initial evaluation for acute seizure, cough.  EXAM: PORTABLE CHEST - 1 VIEW  COMPARISON:  Prior radiograph from 08/24/2014.  FINDINGS: Cardiac and mediastinal silhouettes are stable in size and contour, and remain within normal limits.  Lungs are mildly hypoinflated. There is hazy opacity within the mid and lower left lung, which may reflect sequela of infectious or aspiration pneumonitis. Right lung is clear. No pulmonary edema or pleural effusion. No pneumothorax.  No acute osseus abnormality.  IMPRESSION: Hazy opacity within the mid and lower left lung, which may  reflect sequela of infectious or aspiration pneumonitis.   Electronically Signed   By: Rise Mu M.D.   On: 10/18/2014 04:35    Scheduled Meds:  Continuous Infusions: . ampicillin-sulbactam (UNASYN) IV 1.5 g (10/18/14 1458)  . levETIRAcetam 750 mg (10/18/14 1338)    Principal Problem:   Seizure Active Problems:   Autism spectrum disorder associated with neurodevelopmental, mental or behavioral disorder, requiring substantial support (level 2)   Hyperglycemia   Lactic acidosis   CHIU, STEPHEN K  [facility] Pager [phone]. If 7PM-7AM, please contact night-coverage at [email or web address], password [facility] 10/18/2014, 4:02 PM  LOS: 0 days

## 2014-10-18 NOTE — ED Notes (Signed)
Reclining chair at the bedside for grandmother. Pillow given to her for comfort.

## 2014-10-18 NOTE — ED Notes (Signed)
MD at bedside. 

## 2014-10-18 NOTE — H&P (Addendum)
[facility] History and Physical  Atlas Crossland ZOX:[id number] DOB: 1995/10/03 DOA: 10/18/2014  Referring physician: Dr. Wilkie Aye. PCP: Maree Erie, MD  Specialists: None.  Chief Complaint: Seizure.  History obtained from patient's mother. Patient has history of autism.  HPI: Jonathan [name] is a 19 y.o. male history of autism and seizure was brought to the ER after patient was found to have multiple episodes of seizure. Patient was admitted in July of this year 2 months ago for the first episode of seizure. At that time patient was placed on Keppra. Patient's mother states that patient has been compliant with his medications. Last evening around 10:00 patient was witnessed to have a generalized tonic-clonic seizure which lasted for around 2 minutes by patient's mother. Patient did not lose consciousness after the seizure. EMS was called and en route patient had 2 more episodes of seizure for which patient was given Melisa. In the ER patient had another focal seizure involving the right upper extremity for which patient was given Ativan. Patient at this time is sedated and encephalopathic. Lactic acid levels are elevated. As per patient's mother patient has been having some cough for last few days. Did not have any chest pain or shortness of breath nausea vomiting diarrhea fever chills. On-call neurologist Dr. Cyril Mourning was consulted and patient was loaded on Keppra and admitted for further management of seizure. Patient's mother stated that patient has become more alert after admission.  Review of Systems: As presented in the history of presenting illness, rest negative.  Past Medical History  Diagnosis Date  . Autism   . Seizure 08/24/2014  . Mental retardation, idiopathic severe    History reviewed. No pertinent past surgical history. Social History:  reports that he has never smoked. He does not have any smokeless tobacco history on file. He reports that he does not drink alcohol or use  illicit drugs. Where does patient live at home. Can patient participate in ADLs? No.  No Known Allergies  Family History:  Family History  Problem Relation Age of Onset  . Obesity Other   . Stroke Other   . Hypertension Other   . Arthritis Paternal Grandmother   . Seizures Maternal Uncle       Prior to Admission medications   Medication Sig Start Date End Date Taking? Authorizing Shooter Tangen  cetirizine (ZYRTEC) 1 MG/ML syrup Take 10 mls (10 mg) by mouth daily at bedtime as needed for allergy symptom control 07/15/13  Yes Maree Erie, MD  levETIRAcetam (KEPPRA) 500 MG tablet Take 1 tablet (500 mg total) by mouth 2 (two) times daily. 09/01/14  Yes Carmen Dohmeier, MD  risperiDONE (RISPERDAL) 1 MG/ML oral solution TAKE 0.5 MLS BY MOUTH DAILY AT BEDTIME FOR CONTROL OF AGGITATION 02/17/14 02/18/15 Yes Maree Erie, MD  polyethylene glycol powder (GLYCOLAX/MIRALAX) powder Mix one capful in 8 ounces of liquid and drink daily as needed for relief of constipation Patient not taking: Reported on 09/09/2014 07/15/13   Maree Erie, MD    Physical Exam: Filed Vitals:   10/18/14 0200 10/18/14 0215 10/18/14 0230 10/18/14 0320  BP: 135/85 143/87 153/91 151/88  Pulse: 94 101 109 117  Temp:      TempSrc:      Resp: 22 22 32 30  Height:      Weight:      SpO2:   100% 100%     General:  Moderately built and nourished.  Eyes: Anicteric no pallor.  ENT: No discharge from the ears  eyes nose or mouth.  Neck: No neck rigidity and no mass felt.  Cardiovascular: S1 and S2 heard.  Respiratory: No rhonchi or crepitations.  Abdomen: Soft nontender bowel sounds present.  Skin: No rash.  Musculoskeletal: No edema.  Psychiatric: Patient is encephalopathic.  Neurologic: Patient is encephalopathic. Perla positive.  Labs on Admission:  Basic Metabolic Panel:  Recent Labs Lab 10/18/14 0014 10/18/14 0053  NA 141 142  K 4.2 4.1  CL 106 103  CO2 23  --   GLUCOSE 176* 174*  BUN  11 14  CREATININE 0.86 0.80  CALCIUM 9.2  --    Liver Function Tests:  Recent Labs Lab 10/18/14 0014  AST 27  ALT 13*  ALKPHOS 81  BILITOT 0.3  PROT 7.2  ALBUMIN 4.4   No results for input(s): LIPASE, AMYLASE in the last 168 hours. No results for input(s): AMMONIA in the last 168 hours. CBC:  Recent Labs Lab 10/18/14 0014 10/18/14 0053  WBC 10.2  --   HGB 13.7 14.6  HCT 39.5 43.0  MCV 83.3  --   PLT 217  --    Cardiac Enzymes: No results for input(s): CKTOTAL, CKMB, CKMBINDEX, TROPONINI in the last 168 hours.  BNP (last 3 results) No results for input(s): BNP in the last 8760 hours.  ProBNP (last 3 results) No results for input(s): PROBNP in the last 8760 hours.  CBG: No results for input(s): GLUCAP in the last 168 hours.  Radiological Exams on Admission: No results found.   Assessment/Plan Principal Problem:   Seizure Active Problems:   Autism spectrum disorder associated with neurodevelopmental, mental or behavioral disorder, requiring substantial support (level 2)   Hyperglycemia   Lactic acidosis   1. Seizure - appreciate neurology consult. Patient has been placed on Keppra increased dose of 750 mg every 12 hourly. Patient also was given loading dose of Keppra. Closely observe. EEG has been ordered. 2. Lactic acidosis - probably from seizure. Hydrate and recheck lactic acid levels. 3. Cough - patient has been having some productive cough. Check x-rays. 4. Autism - patient is on Risperdal.  Addendum - patient's chest x-ray shows infiltrate concerning for aspiration. Patient has been placed on Unasyn. Check urine for strep and legionella. I have also ordered a swallow evaluation.   DVT Prophylaxis Lovenox.  Code Status: Full code.  Family Communication: Discussed with patient's mother.  Disposition Plan: Admit to inpatient.    KAKRAKANDY,ARSHAD N. [facility] Pager [phone].  If 7PM-7AM, please contact  night-coverage [email or web address] Password [email or web address] 10/18/2014, 4:16 AM

## 2014-10-18 NOTE — ED Notes (Signed)
Tray ordered (will give to grandmother) pt incontinent of urine. Clean diaper on.

## 2014-10-18 NOTE — Procedures (Signed)
ELECTROENCEPHALOGRAM REPORT   Patient: Jonathan [name]       Room #: D36 Age: 19 y.o.        Sex: male Referring Physician: Dr Rhona Leavens [facility] Date:  10/18/2014        Interpreting Physician: Omelia Blackwater   History: [name] is an 19 y.o. male hx of autism presenting with breakthrough seizures  Medications:  Scheduled:  PRN:  Conditions of Recording:  This is a 16 channel EEG carried out with the patient in the drowsy and asleep state.  Description:  The patient is in the drowsy and asleep state for the majority of the recording. There is a brief period of arousal where a background rhythm in the 8-9Hz  alpha frequency is briefly noted. No focal slowing or epileptiform activity is noted.   The patient drowses with slowing to irregular, low voltage theta and beta activity. The patient goes in to a light sleep with symmetrical sleep spindles, vertex central sharp transients and irregular slow activity.   Hyperventilation was not performed. Intermittent photic stimulation was not performed.   IMPRESSION: This is a normal drowsy and asleep electroencephalogram.No epileptiform activity is noted.   Elspeth Cho, DO [facility] [phone]  If 7pm- 7am, please page neurology on call as listed in AMION. 10/18/2014, 10:23 AM

## 2014-10-18 NOTE — ED Notes (Signed)
Grandmother at bedside, Dr. Thad Ranger in to see.

## 2014-10-18 NOTE — ED Notes (Signed)
Lab at the bedside 

## 2014-10-18 NOTE — ED Notes (Signed)
Dr.Camilo at bedside  

## 2014-10-18 NOTE — ED Notes (Signed)
Pt arrives via EMS from home. Grandmother called EMS after pt had a seizure. En route pt had 2 seizures, EMS gave 2.5 mg Versed and pt became unresponsive.

## 2014-10-18 NOTE — ED Notes (Signed)
Dr. Reynolds at the bedside.  

## 2014-10-18 NOTE — ED Notes (Signed)
Dr. Kirtland Bouchard at the bedside.

## 2014-10-18 NOTE — Consult Note (Signed)
NEURO HOSPITALIST CONSULT NOTE   Referring physician: Dr Dina Rich Reason for Consult: recurrent seizures  HPI:                                                                                                                                          Jonathan [name] is an 19 y.o. male with a past medical history significant for prematurity, autism, MR, symptomatic GTC seizures on keppra, brought in by EMS after sustaining a witnessed GTC seizure at home. Patient reportedly had 2 more seizures in ambulance and a witnessed focal seizure in the ED involving the right arm that lasted for few seconds. Patient received 1 mg IV ativan in the ED and versed by EMS. No further seizures.  Mother is at the bedside and reports that he is not missing any dose of his anticonvulsant, no sleep deprivation, recent fever, medical illnesses, sleep deprivation, or new medications, BUT said that he has been coughing a lot lately. Serologies significant for lactic acid 5.39, pCO2 58.2, pO2 36, pH 7.29.   Past Medical History  Diagnosis Date  . Autism   . Seizure 08/24/2014  . Mental retardation, idiopathic severe     History reviewed. No pertinent past surgical history.  Family History  Problem Relation Age of Onset  . Obesity Other   . Stroke Other   . Hypertension Other   . Arthritis Paternal Grandmother   . Seizures Maternal Uncle     Family History: no MS, brain tumors, or brain aneurysm   Social History:  reports that he has never smoked. He does not have any smokeless tobacco history on file. He reports that he does not drink alcohol or use illicit drugs.  No Known Allergies  MEDICATIONS:                                                                                                                     I have reviewed the patient's current medications.   ROS: unable to obtain due to mental status  History obtained from mother and chart review   Physical exam:  Constitutional: well developed, pleasant male in no apparent distress. Blood pressure 139/85, pulse 109, temperature 97.2 F (36.2 C), temperature source Axillary, resp. rate 32, height 5' 9"  (1.753 m), weight 50.803 kg (112 lb), SpO2 100 %. Eyes: no jaundice or exophthalmos.  Head: normocephalic. Neck: supple, no bruits, no JVD. Cardiac: no murmurs. Lungs: clear. Abdomen: soft, no tender, no mass. Extremities: no edema, clubbing, or cyanosis.  Skin: no rash  Neurologic Examination:                                                                                                      Mental status: sedated + post ictal, resisting blood puncture for ABG. CN 2-12: pupils 3-4 mm bilaterally, reactive, no gaze preference. Motor: withdraws to pain in the upper extremities. Minimal motor movements lower extremities. Sensory: reacts to pain. DTR's: 1 + all over. Plantars: no tested. Coordination and gait: unable to test due to mental status    No results found for: CHOL  Results for orders placed or performed during the hospital encounter of 10/18/14 (from the past 48 hour(s))  CBC     Status: None   Collection Time: 10/18/14 12:14 AM  Result Value Ref Range   WBC 10.2 4.0 - 10.5 K/uL   RBC 4.74 4.22 - 5.81 MIL/uL   Hemoglobin 13.7 13.0 - 17.0 g/dL   HCT 39.5 39.0 - 52.0 %   MCV 83.3 78.0 - 100.0 fL   MCH 28.9 26.0 - 34.0 pg   MCHC 34.7 30.0 - 36.0 g/dL   RDW 13.7 11.5 - 15.5 %   Platelets 217 150 - 400 K/uL  Comprehensive metabolic panel     Status: Abnormal   Collection Time: 10/18/14 12:14 AM  Result Value Ref Range   Sodium 141 135 - 145 mmol/L   Potassium 4.2 3.5 - 5.1 mmol/L   Chloride 106 101 - 111 mmol/L   CO2 23 22 - 32 mmol/L   Glucose, Bld 176 (H) 65 - 99 mg/dL   BUN 11 6 - 20 mg/dL   Creatinine, Ser 0.86 0.61 - 1.24 mg/dL   Calcium 9.2 8.9 - 10.3 mg/dL    Total Protein 7.2 6.5 - 8.1 g/dL   Albumin 4.4 3.5 - 5.0 g/dL   AST 27 15 - 41 U/L   ALT 13 (L) 17 - 63 U/L   Alkaline Phosphatase 81 38 - 126 U/L   Total Bilirubin 0.3 0.3 - 1.2 mg/dL   GFR calc non Af Amer >60 >60 mL/min   GFR calc Af Amer >60 >60 mL/min    Comment: (NOTE) The eGFR has been calculated using the CKD EPI equation. This calculation has not been validated in all clinical situations. eGFR's persistently <60 mL/min signify possible Chronic Kidney Disease.    Anion gap 12 5 - 15  I-Stat Arterial Blood Gas, ED - (order at [facility] and [facility] only)     Status: Abnormal   Collection Time: 10/18/14 12:41 AM  Result Value Ref Range  pH, Arterial 7.291 (L) 7.350 - 7.450   pCO2 arterial 58.2 (HH) 35.0 - 45.0 mmHg   pO2, Arterial 36.0 (LL) 80.0 - 100.0 mmHg   Bicarbonate 28.3 (H) 20.0 - 24.0 mEq/L   TCO2 30 0 - 100 mmol/L   O2 Saturation 64.0 %   Patient temperature 97.2 F    Collection site RADIAL, ALLEN'S TEST ACCEPTABLE    Drawn by Operator    Sample type ARTERIAL    Comment MD NOTIFIED, SUGGEST RECOLLECT     No results found.  Assessment/Plan: 19 y.o. male with a past medical history significant for prematurity, autism, MR, symptomatic GTC seizures on keppra, with a cluster of GTC seizures. Serologies significant for lactic acid 5.39, pCO2 58.2, pO2 36, pH 7.29. Recommend: 1) Loading dose 1 gram IV keppra now. 2) Continue keppra 750 mg BID  3) EEG if no return to baseline. Will continue to follow.  Dorian Pod, MD [facility] 10/18/2014, 12:51 AM

## 2014-10-18 NOTE — ED Provider Notes (Signed)
CSN: [id number]   Arrival date & time 10/18/14 0008  History  This chart was scribed for Shon Baton, MD by Bethel Born, ED Scribe. This patient was seen in room [facility] and the patient's care was started at 12:06 AM.  Chief Complaint  Patient presents with  . Altered Mental Status  . Seizures    HPI The history is provided by a relative and the EMS personnel. No language interpreter was used.   Brought in by EMS, [name] is a 19 y.o. male  With PMHx of seizures, mental retardation, and seizures who presents to the Emergency Department with his grandmother complaining of seizures with onset tonight. EMS was called out for a grand mal seizure and the pt had 2 more focal seizures in their presence. He was given 2.5 mg of versed en route and became less responsive and required bagging. Associated symptoms include urinary incontinence. No recent illness per grandmother. Pt is non-verbal at baseline.   Level V caveat. Per chart review, patient has been on Keppra since July for seizures. Grandmother states that he has had no further seizures until today. Past Medical History  Diagnosis Date  . Autism   . Seizure 08/24/2014  . Mental retardation, idiopathic severe     History reviewed. No pertinent past surgical history.  Family History  Problem Relation Age of Onset  . Obesity Other   . Stroke Other   . Hypertension Other   . Arthritis Paternal Grandmother   . Seizures Maternal Uncle     Social History  Substance Use Topics  . Smoking status: Never Smoker   . Smokeless tobacco: None  . Alcohol Use: No     Review of Systems  Unable to perform ROS: Patient nonverbal   Home Medications   Prior to Admission medications   Medication Sig Start Date End Date Taking? Authorizing Provider  cetirizine (ZYRTEC) 1 MG/ML syrup Take 10 mls (10 mg) by mouth daily at bedtime as needed for allergy symptom control 07/15/13  Yes Maree Erie, MD  levETIRAcetam (KEPPRA) 500 MG  tablet Take 1 tablet (500 mg total) by mouth 2 (two) times daily. 09/01/14  Yes Carmen Dohmeier, MD  risperiDONE (RISPERDAL) 1 MG/ML oral solution TAKE 0.5 MLS BY MOUTH DAILY AT BEDTIME FOR CONTROL OF AGGITATION 02/17/14 02/18/15 Yes Maree Erie, MD  polyethylene glycol powder (GLYCOLAX/MIRALAX) powder Mix one capful in 8 ounces of liquid and drink daily as needed for relief of constipation Patient not taking: Reported on 09/09/2014 07/15/13   Maree Erie, MD    Allergies  Review of patient's allergies indicates no known allergies.  Triage Vitals: BP 130/63 mmHg  Pulse 102  Temp(Src) 97.2 F (36.2 C) (Axillary)  Resp 24  Ht  (1.753 m)  Wt 112 lb (50.803 kg)  BMI 16.53 kg/m2  SpO2 97%  Physical Exam  Constitutional:  Somnolent, minimally arousable  HENT:  Head: Normocephalic and atraumatic.  Frothy sputum noted at the lips  Eyes: Pupils are equal, round, and reactive to light.  Pupils 4 mm reactive bilaterally, roving eye movements  Neck: Neck supple.  Cardiovascular: Normal rate, regular rhythm and normal heart sounds.   No murmur heard. Pulmonary/Chest: Effort normal and breath sounds normal. No respiratory distress. He has no wheezes.  Abdominal: Soft. Bowel sounds are normal. There is no tenderness. There is no rebound.  Musculoskeletal:  Contractured upper extremities  Lymphadenopathy:    He has no cervical adenopathy.  Neurological:  Somnolent, minimally  reactive to tactile stimulus, appears to move all 4 extremities  Skin: Skin is warm and dry.  Psychiatric:  Unable to assess  Nursing note and vitals reviewed.   ED Course  Procedures   CRITICAL CARE Performed by: Shon Baton   Total critical care time: 40 min  Critical care time was exclusive of separately billable procedures and treating other patients.  Critical care was necessary to treat or prevent imminent or life-threatening deterioration.  Critical care was time spent personally by  me on the following activities: development of treatment plan with patient and/or surrogate as well as nursing, discussions with consultants, evaluation of patient's response to treatment, examination of patient, obtaining history from patient or surrogate, ordering and performing treatments and interventions, ordering and review of laboratory studies, ordering and review of radiographic studies, pulse oximetry and re-evaluation of patient's condition.    DIAGNOSTIC STUDIES: Oxygen Saturation is 97% on 3 L , adequate    COORDINATION OF CARE: 12:20 AM Treatment plan includes neuro consult, lab work,  Ativan, and Keppra  Labs Reviewed  COMPREHENSIVE METABOLIC PANEL - Abnormal; Notable for the following:    Glucose, Bld 176 (*)    ALT 13 (*)    All other components within normal limits  I-STAT ARTERIAL BLOOD GAS, ED - Abnormal; Notable for the following:    pH, Arterial 7.291 (*)    pCO2 arterial 58.2 (*)    pO2, Arterial 36.0 (*)    Bicarbonate 28.3 (*)    All other components within normal limits  I-STAT CG4 LACTIC ACID, ED - Abnormal; Notable for the following:    Lactic Acid, Venous 5.39 (*)    All other components within normal limits  I-STAT CHEM 8, ED - Abnormal; Notable for the following:    Glucose, Bld 174 (*)    All other components within normal limits  I-STAT ARTERIAL BLOOD GAS, ED - Abnormal; Notable for the following:    pCO2 arterial 53.0 (*)    pO2, Arterial 419.0 (*)    Bicarbonate 31.4 (*)    Acid-Base Excess 5.0 (*)    All other components within normal limits  CBC    Imaging Review No results found.   MDM   Final diagnoses:  Seizure    Patient presents with concern for multiple seizures prior to arrival. His either postictal or continues to sees on initial evaluation. Patient did have one episode of rhythmic right upper extremity movement in my presence. He was given an additional 1 mg of Ativan. Currently he appears to be protecting his airway. He  satting 97% on a nonrebreather. Neurology was emergently consulted given concern for possible status. Workup initially notable for a blood gas of 7.29 and a PCO2 of 58. Lactic acid is 5.39. This is likely all representative of patient's recurrent seizure activity.  Will repeat ABG in one hour. Loaded with IV Keppra per neurology. Patient is now moaning and more responsive to tactile stimulus. Repeat ABG is improved with improving CO2  And PH. She will be admitted to the stepdown unit.  I personally performed the services described in this documentation, which was scribed in my presence. The recorded information has been reviewed and is accurate.    Shon Baton, MD 10/18/14 [phone]

## 2014-10-18 NOTE — Progress Notes (Signed)
EEG completed; results pending.    

## 2014-10-18 NOTE — Progress Notes (Signed)
Rt informed Dr Wilkie Aye of ABG results, MD gave verbal order to redraw ABG in 30 minutes from now.

## 2014-10-19 DIAGNOSIS — E876 Hypokalemia: Secondary | ICD-10-CM

## 2014-10-19 DIAGNOSIS — R569 Unspecified convulsions: Secondary | ICD-10-CM

## 2014-10-19 DIAGNOSIS — R000 Tachycardia, unspecified: Secondary | ICD-10-CM | POA: Diagnosis present

## 2014-10-19 DIAGNOSIS — F840 Autistic disorder: Secondary | ICD-10-CM

## 2014-10-19 LAB — BASIC METABOLIC PANEL
Anion gap: 6 (ref 5–15)
BUN: 11 mg/dL (ref 6–20)
CALCIUM: 8.3 mg/dL — AB (ref 8.9–10.3)
CO2: 25 mmol/L (ref 22–32)
CREATININE: 0.8 mg/dL (ref 0.61–1.24)
Chloride: 107 mmol/L (ref 101–111)
GFR calc Af Amer: >60 mL/min (ref >60)
GLUCOSE: 101 mg/dL — AB (ref 65–99)
Potassium: 3.3 mmol/L — ABNORMAL LOW (ref 3.5–5.1)
Sodium: 138 mmol/L (ref 135–145)

## 2014-10-19 LAB — LACTIC ACID, PLASMA: Lactic Acid, Venous: 0.9 mmol/L (ref 0.5–2.0)

## 2014-10-19 LAB — URINALYSIS, ROUTINE W REFLEX MICROSCOPIC
BILIRUBIN URINE: NEGATIVE
Glucose, UA: NEGATIVE mg/dL
HGB URINE DIPSTICK: NEGATIVE
Ketones, ur: NEGATIVE mg/dL
Leukocytes, UA: NEGATIVE
NITRITE: NEGATIVE
PROTEIN: NEGATIVE mg/dL
SPECIFIC GRAVITY, URINE: 1.02 (ref 1.005–1.030)
UROBILINOGEN UA: 0.2 mg/dL (ref 0.0–1.0)
pH: 5.5 (ref 5.0–8.0)

## 2014-10-19 LAB — HEMOGLOBIN A1C
HEMOGLOBIN A1C: 5.5 % (ref 4.8–5.6)
MEAN PLASMA GLUCOSE: 111 mg/dL

## 2014-10-19 LAB — STREP PNEUMONIAE URINARY ANTIGEN: Strep Pneumo Urinary Antigen: NEGATIVE

## 2014-10-19 LAB — MAGNESIUM: MAGNESIUM: 1.9 mg/dL (ref 1.7–2.4)

## 2014-10-19 MED ORDER — LEVETIRACETAM 100 MG/ML PO SOLN
750.0000 mg | Freq: Two times a day (BID) | ORAL | Status: DC
  Administered 2014-10-19 – 2014-10-20 (×3): 750 mg via ORAL
  Filled 2014-10-19 (×2): qty 10
  Filled 2014-10-19 (×2): qty 7.5

## 2014-10-19 MED ORDER — POTASSIUM CHLORIDE CRYS ER 20 MEQ PO TBCR
40.0000 meq | EXTENDED_RELEASE_TABLET | Freq: Once | ORAL | Status: AC
  Administered 2014-10-19: 40 meq via ORAL
  Filled 2014-10-19: qty 2

## 2014-10-19 NOTE — Progress Notes (Signed)
Utilization Review Completed.  

## 2014-10-19 NOTE — Plan of Care (Signed)
Problem: Phase I Progression Outcomes Goal: Initial discharge plan identified Outcome: Completed/Met Date Met:  10/19/14 To return home with grandmother     

## 2014-10-19 NOTE — Progress Notes (Signed)
Report called to Delcine,RN, on 5W. Patient to be transferred to 5W25. C.Foecking,RN

## 2014-10-19 NOTE — Progress Notes (Signed)
Jonathan [name] is a 19 y.o. male patient who transferred  from [facility] awake, alert  & orientated  X 3, Full Code, VSS - Blood pressure 129/81, pulse 109, temperature 99.4 F (37.4 C), temperature source Axillary, resp. rate 20, height  (1.753 m), weight 50.803 kg (112 lb), SpO2 98 %., R/A, no c/o shortness of breath, no c/o chest pain, no distress noted. Tele # 23 placed and pt is currently running:sinus tachycardia.   IV site WDL: antecubital left & forearm right, condition patent and no redness with a transparent dsg that's clean dry and intact.  Allergies:  No Known Allergies   Past Medical History  Diagnosis Date  . Autism   . Seizure 08/24/2014  . Mental retardation, idiopathic severe     Pt orientation to unit, room and routine. SR up x 2, fall risk assessment complete with Patient and family verbalizing understanding of risks associated with falls. Pt verbalizes an understanding of how to use the call bell and to call for help before getting out of bed.  Skin, clean-dry- intact without evidence of bruising, or skin tears.   No evidence of skin break down noted on exam. no rashes    Will cont to monitor and assist as needed.  Joana Reamer, RN 10/19/2014 5:08 PM

## 2014-10-19 NOTE — Progress Notes (Signed)
Subjective: Patient awake and alert.  Did well with swallowing evaluation.  No further seizures noted. Tolerating increase in Keppra.  Objective: Current vital signs: BP 100/74 mmHg  Pulse 108  Temp(Src) 97.8 F (36.6 C) (Axillary)  Resp 24  Ht  (1.753 m)  Wt 50.803 kg (112 lb)  BMI 16.53 kg/m2  SpO2 95% Vital signs in last 24 hours: Temp:  [97.5 F (36.4 C)-100.1 F (37.8 C)] 97.8 F (36.6 C) (09/21 0400) Pulse Rate:  [86-108] 108 (09/20 2007) Resp:  [8-33] 24 (09/21 0400) BP: (97-122)/(49-78) 100/74 mmHg (09/21 0400) SpO2:  [78 %-100 %] 95 % (09/21 0400)  Intake/Output from previous day: 09/20 0701 - 09/21 0700 In: 950 [I.V.:750; IV Piggyback:200] Out: -  Intake/Output this shift:   Nutritional status: Diet regular Room service appropriate?: Yes; Fluid consistency:: Thin  Neurologic Exam: Mental Status: Awake and alert.  Does not follow commands.  No speech. Cranial Nerves: II: Discs flat bilaterally; Blinks to bilateral confrontation III,IV, VI: tracks objects around the room V,VII: smile symmetric Motor: Moves all extremities against gravity   Lab Results: Basic Metabolic Panel:  Recent Labs Lab 10/18/14 0014 10/18/14 0053 10/18/14 1848 10/19/14 0319  NA 141 142 142 138  K 4.2 4.1 4.1 3.3*  CL 106 103 109 107  CO2 23  --  24 25  GLUCOSE 176* 174* 77 101*  BUN CREATININE 0.86 0.80 0.86 0.80  CALCIUM 9.2  --  8.5* 8.3*    Liver Function Tests:  Recent Labs Lab 10/18/14 0014 10/18/14 1848  AST 27 QUANTITY NOT SUFFICIENT, UNABLE TO PERFORM TEST  ALT 13* QUANTITY NOT SUFFICIENT, UNABLE TO PERFORM TEST  ALKPHOS 81 72  BILITOT 0.3 QUANTITY NOT SUFFICIENT, UNABLE TO PERFORM TEST  PROT 7.2 6.8  ALBUMIN 4.4 3.6   No results for input(s): LIPASE, AMYLASE in the last 168 hours. No results for input(s): AMMONIA in the last 168 hours.  CBC:  Recent Labs Lab 10/18/14 0014 10/18/14 0053 10/18/14 1848  WBC 10.2  --  14.1*   NEUTROABS  --   --  11.3*  HGB 13.7 14.6 14.4  HCT 39.5 43.0 41.4  MCV 83.3  --  83.0  PLT 217  --  177    Cardiac Enzymes: No results for input(s): CKTOTAL, CKMB, CKMBINDEX, TROPONINI in the last 168 hours.  Lipid Panel: No results for input(s): CHOL, TRIG, HDL, CHOLHDL, VLDL, LDLCALC in the last 168 hours.  CBG: No results for input(s): GLUCAP in the last 168 hours.  Microbiology: Results for orders placed or performed during the hospital encounter of 10/18/14  MRSA PCR Screening     Status: None   Collection Time: 10/18/14  7:47 PM  Result Value Ref Range Status   MRSA by PCR NEGATIVE NEGATIVE Final    Comment:        The GeneXpert MRSA Assay (FDA approved for NASAL specimens only), is one component of a comprehensive MRSA colonization surveillance program. It is not intended to diagnose MRSA infection nor to guide or monitor treatment for MRSA infections.     Coagulation Studies: No results for input(s): LABPROT, INR in the last 72 hours.  Imaging: Dg Chest Port 1 View  10/18/2014   CLINICAL DATA:  Initial evaluation for acute seizure, cough.  EXAM: PORTABLE CHEST - 1 VIEW  COMPARISON:  Prior radiograph from 08/24/2014.  FINDINGS: Cardiac and mediastinal silhouettes are stable in size and contour, and remain within normal limits.  Lungs  are mildly hypoinflated. There is hazy opacity within the mid and lower left lung, which may reflect sequela of infectious or aspiration pneumonitis. Right lung is clear. No pulmonary edema or pleural effusion. No pneumothorax.  No acute osseus abnormality.  IMPRESSION: Hazy opacity within the mid and lower left lung, which may reflect sequela of infectious or aspiration pneumonitis.   Electronically Signed   By: Rise Mu M.D.   On: 10/18/2014 04:35    Medications:  I have reviewed the patient's current medications. Scheduled: . ampicillin-sulbactam (UNASYN) IV  1.5 g Intravenous Q6H  . enoxaparin (LOVENOX) injection   40 mg Subcutaneous Q24H  . levETIRAcetam  750 mg Oral BID  . loratadine  10 mg Oral QHS  . potassium chloride  40 mEq Oral Once  . risperiDONE  0.5 mg Oral QHS    Assessment/Plan: No further seizures.  Keppra increased to  BID  Recommendations: 1.  D/C IV Keppra and start suspension at  BID.  Dosing to start today.   2.  Continue seizure precautions   LOS: 1 day   Thana Farr, MD [facility] [phone] 10/19/2014  8:33 AM

## 2014-10-19 NOTE — Progress Notes (Signed)
[facility] PROGRESS NOTE  Jonathan [name] WUJ:[id number] DOB: [birth year] DOA: 10/18/2014 PCP: Maree Erie, MD  Brief narrative 19 year old autistic male with ascending diagnose seizures (in July of this year) on Keppra brought to the ED after multiple episodes of seizures at home. As per his mother patient compliant with his medications. On the evening of admission patient was found to have generalized tonic clinic seizures lasting for almost 2 minutes without any loss of consciousness. While being brought to the ED he had 2 more episodes of seizures. In the ED he again had another focal seizure involving the right upper extremity for which he was given IV Ativan and became sedated. Had elevated lactic acid level. Patient did not have any chest discomfort, nausea, vomiting, fevers, chills, shortness of breath vomiting, bowel or urinary incontinence. Neuro hospitalist was consulted who recommended loading with IV Keppra and patient admitted to stepdown unit. EEG was repeated which did not show any epileptiform activity. Patient was also found to have a right-sided infiltrate on chest x-ray.   Assessment/Plan: Recurrent seizures Appreciate neurology recommendations. Patient received loading dose of Keppra in the ED and his Keppra dose was increased from 500-750 mg twice a day (switched to by mouth dose today. EEG without active seizures. MRI brain unremarkable. No further seizure activity since admission.  Lactic acidosis Secondary to seizures. Now normalized.  Right lobar/ aspiration pneumonia Right middle and lower lobe infiltrate seen on chest x-ray. She had elevated WBC and low-grade fever as well. On Unasyn. Strep pneumonia antigen negative, Legionella antigen pending.  Sinus tachycardia Patient was hypoxic on presentation but now stable. Possibly secondary to seizures and pneumonia. Continue to monitor on telemetry. O2 sat stable on room air.  Hypokalemia Replenished. Normal  magnesium level.  Diet: Regular  Code Status: Full code Family Communication: Mother at bedside Disposition Plan: Home once improved. Possibly in the next 24- 48 hours   Consultants: Neurology   Procedures:  MRI brain  EEG  Antibiotics:  IV Unasyn  HPI/Subjective: Seen and examined. No overnight events. Tachycardic in 110-120 on the monitor.  Objective: Filed Vitals:   10/19/14 1216  BP: 129/76  Pulse: 120  Temp: 99.4 F (37.4 C)  Resp: 19    Intake/Output Summary (Last 24 hours) at 10/19/14 1327 Last data filed at 10/19/14 1041  Gross per 24 hour  Intake    950 ml  Output    250 ml  Net    700 ml   Filed Weights   10/18/14 0009  Weight: 50.803 kg (112 lb)    Exam:   General:  Young male in no acute distress, does not comprehend  Cardiovascular: S1 and S2 tachycardic, no murmurs rub or gallop  Respiratory: Clear bilaterally, no added sounds  GI: Soft, nondistended, nontender, bowel doesn't  Musculoskeletal: Warm, no edema  CNS: Alert and awake,non verbal  Data Reviewed: Basic Metabolic Panel:  Recent Labs Lab 10/18/14 0014 10/18/14 0053 10/18/14 1848 10/19/14 0319 10/19/14 1050  NA 141 142 142 138  --   K 4.2 4.1 4.1 3.3*  --   CL 106 103 109 107  --   CO2 23  --  24 25  --   GLUCOSE 176* 174* 77 101*  --   BUN --   CREATININE 0.86 0.80 0.86 0.80  --   CALCIUM 9.2  --  8.5* 8.3*  --   MG  --   --   --   --  1.9   Liver Function Tests:  Recent Labs Lab 10/18/14 0014 10/18/14 1848  AST 27 QUANTITY NOT SUFFICIENT, UNABLE TO PERFORM TEST  ALT 13* QUANTITY NOT SUFFICIENT, UNABLE TO PERFORM TEST  ALKPHOS 81 72  BILITOT 0.3 QUANTITY NOT SUFFICIENT, UNABLE TO PERFORM TEST  PROT 7.2 6.8  ALBUMIN 4.4 3.6   No results for input(s): LIPASE, AMYLASE in the last 168 hours. No results for input(s): AMMONIA in the last 168 hours. CBC:  Recent Labs Lab 10/18/14 0014 10/18/14 0053 10/18/14 1848  WBC 10.2  --  14.1*   NEUTROABS  --   --  11.3*  HGB 13.7 14.6 14.4  HCT 39.5 43.0 41.4  MCV 83.3  --  83.0  PLT 217  --  177   Cardiac Enzymes: No results for input(s): CKTOTAL, CKMB, CKMBINDEX, TROPONINI in the last 168 hours. BNP (last 3 results) No results for input(s): BNP in the last 8760 hours.  ProBNP (last 3 results) No results for input(s): PROBNP in the last 8760 hours.  CBG: No results for input(s): GLUCAP in the last 168 hours.  Recent Results (from the past 240 hour(s))  MRSA PCR Screening     Status: None   Collection Time: 10/18/14  7:47 PM  Result Value Ref Range Status   MRSA by PCR NEGATIVE NEGATIVE Final    Comment:        The GeneXpert MRSA Assay (FDA approved for NASAL specimens only), is one component of a comprehensive MRSA colonization surveillance program. It is not intended to diagnose MRSA infection nor to guide or monitor treatment for MRSA infections.      Studies: Dg Chest Port 1 View  10/18/2014   CLINICAL DATA:  Initial evaluation for acute seizure, cough.  EXAM: PORTABLE CHEST - 1 VIEW  COMPARISON:  Prior radiograph from 08/24/2014.  FINDINGS: Cardiac and mediastinal silhouettes are stable in size and contour, and remain within normal limits.  Lungs are mildly hypoinflated. There is hazy opacity within the mid and lower left lung, which may reflect sequela of infectious or aspiration pneumonitis. Right lung is clear. No pulmonary edema or pleural effusion. No pneumothorax.  No acute osseus abnormality.  IMPRESSION: Hazy opacity within the mid and lower left lung, which may reflect sequela of infectious or aspiration pneumonitis.   Electronically Signed   By: Rise Mu M.D.   On: 10/18/2014 04:35    Scheduled Meds: . ampicillin-sulbactam (UNASYN) IV  1.5 g Intravenous Q6H  . enoxaparin (LOVENOX) injection  40 mg Subcutaneous Q24H  . levETIRAcetam  750 mg Oral BID  . loratadine  10 mg Oral QHS  . risperiDONE  0.5 mg Oral QHS   Continuous  Infusions: . sodium chloride 75 mL/hr at 10/18/14 1855      Time spent: 25  minutes    [name]  [facility] Pager [phone]. If 7PM-7AM, please contact night-coverage at [email or web address], password [facility] 10/19/2014, 1:27 PM  LOS: 1 day

## 2014-10-19 NOTE — Evaluation (Signed)
Clinical/Bedside Swallow Evaluation Patient Details  Name: Jonathan [name] MRN: [id number] Date of Birth: [birth year]  Today's Date: 10/19/2014 Time: SLP Start Time (ACUTE ONLY): 0746 SLP Stop Time (ACUTE ONLY): 0759 SLP Time Calculation (min) (ACUTE ONLY): 13 min  Past Medical History:  Past Medical History  Diagnosis Date  . Autism   . Seizure 08/24/2014  . Mental retardation, idiopathic severe    Past Surgical History: History reviewed. No pertinent past surgical history. HPI:  19 y.o. male history of autism, seizure and mental retardation was brought to the ER after patient was found to have multiple episodes of seizure.  Per MD note mom reports pt has been having some cough for last few days. CXR hazy opacity within the mid and lower left lung, which may reflect sequela of infectious or aspiration pneumonitis.   Assessment / Plan / Recommendation Clinical Impression  Mildly decreased oral manipulation, reduced duration of mastication (impulsivity) with questionable mild lingual residue unable to observe. Liquid wash appeared to clear. Mom/grandmother reports pt is dependent feeder who pockets intermittently and denies coughing with po's or recent pna. No s/s aspiration or  pharyngeal dysfunction. Possibly aspirated secretions/emesis during seizure. Recommend continue regular texture and thin liquids, pills with liquid and full assist. No follow up needed.     Aspiration Risk   (mild-moderate)    Diet Recommendation Age appropriate regular solids;Thin   Medication Administration: Whole meds with liquid Compensations: Slow rate;Small sips/bites;Check for pocketing    Other  Recommendations Oral Care Recommendations: Oral care BID   Follow Up Recommendations       Frequency and Duration        Pertinent Vitals/Pain No indications pain    SLP Swallow Goals     Swallow Study Prior Functional Status       General Other Pertinent Information: 19 y.o. male history of autism,  seizure and mental retardation was brought to the ER after patient was found to have multiple episodes of seizure.  Per MD note mom reports pt has been having some cough for last few days. CXR hazy opacity within the mid and lower left lung, which may reflect sequela of infectious or aspiration pneumonitis. Type of Study: Bedside swallow evaluation Previous Swallow Assessment:  (none found) Diet Prior to this Study: Regular;Thin liquids Temperature Spikes Noted: No Respiratory Status: Room air History of Recent Intubation: No Behavior/Cognition: Alert;Cooperative;Pleasant mood;Doesn't follow directions Oral Cavity - Dentition: Adequate natural dentition/normal for age Self-Feeding Abilities: Total assist Patient Positioning: Upright in bed Baseline Vocal Quality:  (vocalizations clear) Volitional Cough: Cognitively unable to elicit Volitional Swallow: Unable to elicit    Oral/Motor/Sensory Function Overall Oral Motor/Sensory Function:  (does not follow commands)   Ice Chips Ice chips: Not tested   Thin Liquid Thin Liquid: Within functional limits Presentation: Straw    Nectar Thick Nectar Thick Liquid: Not tested   Honey Thick Honey Thick Liquid: Not tested   Puree Puree: Within functional limits   Solid   GO    Solid: Impaired Oral Phase Impairments:  (decreased manipulation ) Oral Phase Functional Implications:  (? lingual residue, unable to observe, liquid wash assisted)       Jonathan [name] 10/19/2014,8:22 AM  Jonathan [name].Ed [employer] [phone]

## 2014-10-20 DIAGNOSIS — J690 Pneumonitis due to inhalation of food and vomit: Secondary | ICD-10-CM | POA: Diagnosis present

## 2014-10-20 LAB — CBC
HEMATOCRIT: 38.9 % — AB (ref 39.0–52.0)
HEMOGLOBIN: 13.5 g/dL (ref 13.0–17.0)
MCH: 28.7 pg (ref 26.0–34.0)
MCHC: 34.7 g/dL (ref 30.0–36.0)
MCV: 82.8 fL (ref 78.0–100.0)
Platelets: 197 10*3/uL (ref 150–400)
RBC: 4.7 MIL/uL (ref 4.22–5.81)
RDW: 13.8 % (ref 11.5–15.5)
WBC: 8 10*3/uL (ref 4.0–10.5)

## 2014-10-20 LAB — LEGIONELLA ANTIGEN, URINE

## 2014-10-20 MED ORDER — LEVETIRACETAM 100 MG/ML PO SOLN: 750.0000 mg | Freq: Two times a day (BID) | ORAL | Status: DC

## 2014-10-20 MED ORDER — AMOXICILLIN-POT CLAVULANATE 875-125 MG PO TABS: 1.0000 | ORAL_TABLET | Freq: Two times a day (BID) | ORAL | Status: AC

## 2014-10-20 NOTE — Discharge Instructions (Signed)
Seizure, Adult A seizure means there is unusual activity in the brain. A seizure can cause changes in attention or behavior. Seizures often cause shaking (convulsions). Seizures often last from 30 seconds to 2 minutes. HOME CARE   If you are given medicines, take them exactly as told by your doctor.  Keep all doctor visits as told.  Do not swim or drive until your doctor says it is okay.  Teach others what to do if you have a seizure. They should:  Lay you on the ground.  Put a cushion under your head.  Loosen any tight clothing around your neck.  Turn you on your side.  Stay with you until you get better. GET HELP RIGHT AWAY IF:   The seizure lasts longer than 2 to 5 minutes.  The seizure is very bad.  The person does not wake up after the seizure.  The person's attention or behavior changes. Drive the person to the emergency room or call your local emergency services (911 in [place] MAKE SURE YOU:   Understand these instructions.  Will watch your condition.  Will get help right away if you are not doing well or get worse. Document Released: 07/03/2007 Document Revised: 04/08/2011 Document Reviewed: 01/02/2011 ExitCare Patient Information 2015 [employer]. This information is not intended to replace advice given to you by your health care provider. Make sure you discuss any questions you have with your health care provider.  

## 2014-10-20 NOTE — Discharge Summary (Addendum)
Physician Discharge Summary  [name] ZOX:[id number] DOB: 1995-04-22 DOA: 10/18/2014  PCP: Maree Erie, MD  Admit date: 10/18/2014 Discharge date: 10/20/2014  Time spent: 35 minutes  Recommendations for Outpatient Follow-up:  1. Discharge home with increased dose of Keppra 750 mg twice daily (liquid). Patient should follow-up with his PCP in one week and with his neurologist in 2 months. 2. Patient will complete a total 7 day course of antibiotics on 9/26.  Discharge Diagnoses:  Principal Problem: Generalized tonic-clonic seizures   Active Problems:   Aspiration pneumonia   Autism spectrum disorder associated with neurodevelopmental, mental or behavioral disorder, requiring substantial support (level 2)   Hyperglycemia   Lactic acidosis   Sinus tachycardia   Hypokalemia     Discharge Condition: Fair  Diet recommendation: Regular  Filed Weights   10/18/14 0009  Weight: 50.803 kg (112 lb)    History of present illness:  Please refer to admission H&P for details, in brief,19 year old autistic male with ascending diagnose seizures (in July of this year) on Keppra brought to the ED after multiple episodes of seizures at home. As per his mother patient compliant with his medications. On the evening of admission patient was found to have generalized tonic clinic seizures lasting for almost 2 minutes without any loss of consciousness. While being brought to the ED he had 2 more episodes of seizures. In the ED he again had another focal seizure involving the right upper extremity for which he was given IV Ativan and became sedated. Had elevated lactic acid level. Patient did not have any chest discomfort, nausea, vomiting, fevers, chills, shortness of breath vomiting, bowel or urinary incontinence. Neuro hospitalist was consulted who recommended loading with IV Keppra and patient admitted to stepdown unit. EEG was repeated which did not show any epileptiform activity. Patient was  also found to have a right-sided infiltrate on chest x-ray.  Hospital Course:  Recurrent seizures Appreciate neurology recommendations. Patient received loading dose of Keppra in the ED and his Keppra dose was increased from 500-750 mg twice a day (switched to by mouth dose today. EEG without active seizures. MRI brain unremarkable. No further seizure activity since admission.  Lactic acidosis Secondary to seizures. Now normalized.  Right lobar/ aspiration pneumonia Right middle and lower lobe infiltrate seen on chest x-ray. Patient had elevated WBC and low-grade fever as well. Placed on Unasyn for suspected aspiration with seizures. Remains afebrile. Leukocytosis resolved. Strep pneumonia and legionella antigen negative. Remains afebrile. Will discharge him on oral Augmentin to complete a total 7 day course of antibiotics.  Sinus tachycardia Patient was hypoxic on presentation but now stable. Possibly secondary to seizures and pneumonia.  Hypokalemia Replenished. Normal magnesium level.  Diet: Regular  Code Status: Full code Family Communication: Mother at bedside Disposition Plan: Home    Consultants: Neurology   Procedures:  MRI brain  EEG  Antibiotics:  IV Unasyn 9/20-9/22    Discharge Exam: Filed Vitals:   10/20/14 0550  BP: 121/70  Pulse: 95  Temp: 98.3 F (36.8 C)  Resp: 16     General: Young male in no acute distress, does not comprehend  Cardiovascular: S1 and S2 normal, no murmurs rub or gallop  Respiratory: Clear bilaterally, no added sounds  GI: Soft, nondistended, nontender, bowel sounds present  Musculoskeletal: Warm, no edema  CNS: Alert and awake,non verbal  Discharge Instructions    Current Discharge Medication List    START taking these medications   Details  amoxicillin-clavulanate (AUGMENTIN) 875-125 MG per  tablet Take 1 tablet by mouth 2 (two) times daily. Qty: 5 tablet, Refills: 0    levETIRAcetam (KEPPRA) 100 MG/ML  solution Take 7.5 mLs (750 mg total) by mouth 2 (two) times daily. Qty: 473 mL, Refills: 3      CONTINUE these medications which have NOT CHANGED   Details  cetirizine (ZYRTEC) 1 MG/ML syrup Take 10 mls (10 mg) by mouth daily at bedtime as needed for allergy symptom control Qty: 240 mL, Refills: 6   Associated Diagnoses: Allergic rhinitis, unspecified allergic rhinitis type    risperiDONE (RISPERDAL) 1 MG/ML oral solution TAKE 0.5 MLS BY MOUTH DAILY AT BEDTIME FOR CONTROL OF AGGITATION Qty: 30 mL, Refills: 11   Associated Diagnoses: Autism      STOP taking these medications     levETIRAcetam (KEPPRA) 500 MG tablet      polyethylene glycol powder (GLYCOLAX/MIRALAX) powder        No Known Allergies Follow-up Information    Follow up with Maree Erie, MD. Schedule an appointment as soon as possible for a visit in 1 week.   Specialty:  Pediatrics   Contact information:   [address] [address] [phone]       Follow up with [facility], MD. Schedule an appointment as soon as possible for a visit in 2 months.   Specialty:  Neurology   Contact information:   [address] [place] [address] [phone]        The results of significant diagnostics from this hospitalization (including imaging, microbiology, ancillary and laboratory) are listed below for reference.    Significant Diagnostic Studies: Dg Chest Port 1 View  10/18/2014   CLINICAL DATA:  Initial evaluation for acute seizure, cough.  EXAM: PORTABLE CHEST - 1 VIEW  COMPARISON:  Prior radiograph from 08/24/2014.  FINDINGS: Cardiac and mediastinal silhouettes are stable in size and contour, and remain within normal limits.  Lungs are mildly hypoinflated. There is hazy opacity within the mid and lower left lung, which may reflect sequela of infectious or aspiration pneumonitis. Right lung is clear. No pulmonary edema or pleural effusion. No pneumothorax.  No acute osseus  abnormality.  IMPRESSION: Hazy opacity within the mid and lower left lung, which may reflect sequela of infectious or aspiration pneumonitis.   Electronically Signed   By: Rise Mu M.D.   On: 10/18/2014 04:35    Microbiology: Recent Results (from the past 240 hour(s))  MRSA PCR Screening     Status: None   Collection Time: 10/18/14  7:47 PM  Result Value Ref Range Status   MRSA by PCR NEGATIVE NEGATIVE Final    Comment:        The GeneXpert MRSA Assay (FDA approved for NASAL specimens only), is one component of a comprehensive MRSA colonization surveillance program. It is not intended to diagnose MRSA infection nor to guide or monitor treatment for MRSA infections.      Labs: Basic Metabolic Panel:  Recent Labs Lab 10/18/14 0014 10/18/14 0053 10/18/14 1848 10/19/14 0319 10/19/14 1050  NA 141 142 142 138  --   K 4.2 4.1 4.1 3.3*  --   CL 106 103 109 107  --   CO2 23  --  24 25  --   GLUCOSE 176* 174* 77 101*  --   BUN --   CREATININE 0.86 0.80 0.86 0.80  --   CALCIUM 9.2  --  8.5* 8.3*  --  MG  --   --   --   --  1.9   Liver Function Tests:  Recent Labs Lab 10/18/14 0014 10/18/14 1848  AST 27 QUANTITY NOT SUFFICIENT, UNABLE TO PERFORM TEST  ALT 13* QUANTITY NOT SUFFICIENT, UNABLE TO PERFORM TEST  ALKPHOS 81 72  BILITOT 0.3 QUANTITY NOT SUFFICIENT, UNABLE TO PERFORM TEST  PROT 7.2 6.8  ALBUMIN 4.4 3.6   No results for input(s): LIPASE, AMYLASE in the last 168 hours. No results for input(s): AMMONIA in the last 168 hours. CBC:  Recent Labs Lab 10/18/14 0014 10/18/14 0053 10/18/14 1848 10/20/14 0635  WBC 10.2  --  14.1* 8.0  NEUTROABS  --   --  11.3*  --   HGB 13.7 14.6 14.4 13.5  HCT 39.5 43.0 41.4 38.9*  MCV 83.3  --  83.0 82.8  PLT 217  --  177 197   Cardiac Enzymes: No results for input(s): CKTOTAL, CKMB, CKMBINDEX, TROPONINI in the last 168 hours. BNP: BNP (last 3 results) No results for input(s): BNP in the last  8760 hours.  ProBNP (last 3 results) No results for input(s): PROBNP in the last 8760 hours.  CBG: No results for input(s): GLUCAP in the last 168 hours.     SignedEddie North  [facility] 10/20/2014, 1:01 PM

## 2014-10-20 NOTE — Discharge Planning (Addendum)
Pt IV and tele removed.  Given DC papers, explained and educated.  Told of suggested FU appts. Given scripts. VSS and RN assessment revealed stability for DC to home.  Pt given bath before DC.  Pt will be wheeled to front and family taking home via car.

## 2014-10-20 NOTE — Evaluation (Signed)
Physical Therapy Evaluation Patient Details Name: Jonathan [name] MRN: [id number] DOB: [birth year] Today's Date: 10/20/2014   History of Present Illness  19 y.o. male history of autism and seizure was brought to the ER after patient was found to have multiple episodes of seizure.  Clinical Impression  Pt admitted with above diagnosis. Pt currently with functional limitations due to the deficits listed below (see PT Problem List). Per pt's grandmother, he appears to be near baseline with mobility. He walked 150' with min hand held assist for balance. From PT standpoint he is ready to DC home.  Pt will benefit from skilled PT to increase their independence and safety with mobility to allow discharge to the venue listed below.       Follow Up Recommendations No PT follow up    Equipment Recommendations  None recommended by PT    Recommendations for Other Services       Precautions / Restrictions Precautions Precautions: Fall Restrictions Weight Bearing Restrictions: No      Mobility  Bed Mobility Overal bed mobility: Needs Assistance Bed Mobility: Supine to Sit     Supine to sit: Mod assist     General bed mobility comments: assist to raise trunk and advance BLEs  Transfers Overall transfer level: Needs assistance Equipment used: 1 person hand held assist Transfers: Sit to/from Stand Sit to Stand: Mod assist         General transfer comment: Mod assist to rise/steady  Ambulation/Gait Ambulation/Gait assistance: Min assist Ambulation Distance (Feet): 150 Feet Assistive device: 1 person hand held assist Gait Pattern/deviations: Decreased step length - right;Decreased step length - left   Gait velocity interpretation: at or above normal speed for age/gender General Gait Details: walks on toes, this is baseline per grandmother, min A for balance and for navigation  Stairs            Wheelchair Mobility    Modified Rankin (Stroke Patients Only)       Balance  Overall balance assessment: Needs assistance   Sitting balance-Leahy Scale: Fair       Standing balance-Leahy Scale: Poor                               Pertinent Vitals/Pain Pain Assessment: Faces Faces Pain Scale: No hurt    Home Living Family/patient expects to be discharged to:: Private residence Living Arrangements: Other relatives (grandmother [name] ) Available Help at Discharge: Family Type of Home: Apartment Home Access: Stairs to enter Entrance Stairs-Rails: Right Entrance Stairs-Number of Steps: 12 Home Layout: One level        Prior Function Level of Independence: Needs assistance   Gait / Transfers Assistance Needed: walked with single hand held assist  ADL's / Homemaking Assistance Needed: grandmother assists with sponge bathing        Hand Dominance        Extremity/Trunk Assessment   Upper Extremity Assessment: Difficult to assess due to impaired cognition (at baseline)           Lower Extremity Assessment: Difficult to assess due to impaired cognition (at baseline)      Cervical / Trunk Assessment: Normal  Communication   Communication: Other (comment) (pt is non-verbal)  Cognition Arousal/Alertness: Awake/alert Behavior During Therapy: WFL for tasks assessed/performed Overall Cognitive Status: History of cognitive impairments - at baseline  General Comments      Exercises        Assessment/Plan    PT Assessment Patent does not need any further PT services  PT Diagnosis Abnormality of gait   PT Problem List    PT Treatment Interventions     PT Goals (Current goals can be found in the Care Plan section) Acute Rehab PT Goals Patient Stated Goal: return to school (per grandmother) PT Goal Formulation: With family    Frequency     Barriers to discharge        Co-evaluation               End of Session Equipment Utilized During Treatment: Gait belt Activity Tolerance:  Patient tolerated treatment well Patient left: in chair;with call bell/phone within reach;with family/visitor present Nurse Communication: Mobility status         Time: 1610-9604 PT Time Calculation (min) (ACUTE ONLY): 22 min   Charges:   PT Evaluation $Initial PT Evaluation Tier I: 1 Procedure     PT G CodesRalene Bathe Kistler 10/20/2014, 2:05 PM [phone]

## 2014-10-20 NOTE — Progress Notes (Signed)
Subjective: No further seizures noted.  Family reports that patient did well overnight.  Tolerating Keppra solution and she prefers this to pills.      Objective: Current vital signs: BP 121/70 mmHg  Pulse 95  Temp(Src) 98.3 F (36.8 C) (Axillary)  Resp 16  Ht  (1.753 m)  Wt 50.803 kg (112 lb)  BMI 16.53 kg/m2  SpO2 94% Vital signs in last 24 hours: Temp:  [98.3 F (36.8 C)-99.4 F (37.4 C)] 98.3 F (36.8 C) (09/22 0550) Pulse Rate:  [93-122] 95 (09/22 0550) Resp:  [16-26] 16 (09/22 0550) BP: (121-132)/(68-83) 121/70 mmHg (09/22 0550) SpO2:  [94 %-98 %] 94 % (09/22 0550)  Intake/Output from previous day: 09/21 0701 - 09/22 0700 In: 600 [I.V.:600] Out: 2050 [Urine:2050] Intake/Output this shift:   Nutritional status: Diet regular Room service appropriate?: Yes; Fluid consistency:: Thin  Neurologic Exam: Mental Status: Awake and alert. Does not follow commands. No speech. Cranial Nerves: II: Discs flat bilaterally; Blinks to bilateral confrontation III,IV, VI: tracks objects around the room V,VII: face symmetric Motor: Moves all extremities against gravity   Lab Results: Basic Metabolic Panel:  Recent Labs Lab 10/18/14 0014 10/18/14 0053 10/18/14 1848 10/19/14 0319 10/19/14 1050  NA 141 142 142 138  --   K 4.2 4.1 4.1 3.3*  --   CL 106 103 109 107  --   CO2 23  --  24 25  --   GLUCOSE 176* 174* 77 101*  --   BUN --   CREATININE 0.86 0.80 0.86 0.80  --   CALCIUM 9.2  --  8.5* 8.3*  --   MG  --   --   --   --  1.9    Liver Function Tests:  Recent Labs Lab 10/18/14 0014 10/18/14 1848  AST 27 QUANTITY NOT SUFFICIENT, UNABLE TO PERFORM TEST  ALT 13* QUANTITY NOT SUFFICIENT, UNABLE TO PERFORM TEST  ALKPHOS 81 72  BILITOT 0.3 QUANTITY NOT SUFFICIENT, UNABLE TO PERFORM TEST  PROT 7.2 6.8  ALBUMIN 4.4 3.6   No results for input(s): LIPASE, AMYLASE in the last 168 hours. No results for input(s): AMMONIA in the last 168  hours.  CBC:  Recent Labs Lab 10/18/14 0014 10/18/14 0053 10/18/14 1848 10/20/14 0635  WBC 10.2  --  14.1* 8.0  NEUTROABS  --   --  11.3*  --   HGB 13.7 14.6 14.4 13.5  HCT 39.5 43.0 41.4 38.9*  MCV 83.3  --  83.0 82.8  PLT 217  --  177 197    Cardiac Enzymes: No results for input(s): CKTOTAL, CKMB, CKMBINDEX, TROPONINI in the last 168 hours.  Lipid Panel: No results for input(s): CHOL, TRIG, HDL, CHOLHDL, VLDL, LDLCALC in the last 168 hours.  CBG: No results for input(s): GLUCAP in the last 168 hours.  Microbiology: Results for orders placed or performed during the hospital encounter of 10/18/14  MRSA PCR Screening     Status: None   Collection Time: 10/18/14  7:47 PM  Result Value Ref Range Status   MRSA by PCR NEGATIVE NEGATIVE Final    Comment:        The GeneXpert MRSA Assay (FDA approved for NASAL specimens only), is one component of a comprehensive MRSA colonization surveillance program. It is not intended to diagnose MRSA infection nor to guide or monitor treatment for MRSA infections.     Coagulation Studies: No results for input(s): LABPROT, INR in the last 72  hours.  Imaging: No results found.  Medications:  I have reviewed the patient's current medications. Scheduled: . ampicillin-sulbactam (UNASYN) IV  1.5 g Intravenous Q6H  . enoxaparin (LOVENOX) injection  40 mg Subcutaneous Q24H  . levETIRAcetam  750 mg Oral BID  . loratadine  10 mg Oral QHS  . risperiDONE  0.5 mg Oral QHS    Assessment/Plan: Patient awake and alert.  No further seizures noted.  Tolerating increase in Keppra.  Recommendations: 1.  Continue Keppra at current dose   LOS: 2 days   Thana Farr, MD [facility] [phone] 10/20/2014  9:56 AM

## 2014-10-28 ENCOUNTER — Ambulatory Visit: Payer: Medicare Other | Admitting: Pediatrics

## 2014-11-17 ENCOUNTER — Ambulatory Visit: Payer: Medicare Other | Admitting: Pediatrics

## 2014-11-24 ENCOUNTER — Encounter: Payer: Self-pay | Admitting: Pediatrics

## 2014-11-24 ENCOUNTER — Ambulatory Visit (INDEPENDENT_AMBULATORY_CARE_PROVIDER_SITE_OTHER): Payer: Medicaid Other | Admitting: Pediatrics

## 2014-11-24 VITALS — Wt 110.0 lb

## 2014-11-24 DIAGNOSIS — G40909 Epilepsy, unspecified, not intractable, without status epilepticus: Secondary | ICD-10-CM

## 2014-11-24 DIAGNOSIS — F840 Autistic disorder: Secondary | ICD-10-CM | POA: Diagnosis not present

## 2014-11-24 DIAGNOSIS — J309 Allergic rhinitis, unspecified: Secondary | ICD-10-CM

## 2014-11-24 DIAGNOSIS — Z23 Encounter for immunization: Secondary | ICD-10-CM

## 2014-11-24 NOTE — Patient Instructions (Signed)

## 2014-11-26 ENCOUNTER — Encounter: Payer: Self-pay | Admitting: Pediatrics

## 2014-11-26 NOTE — Progress Notes (Signed)
Subjective:     Patient ID: Jonathan [name], male   DOB: [birth year], 19 y.o.   MRN: [id number]  HPI Jonathan [name] is here today to follow-up after hospitalization for pneumonia and seizure. He is accompanied by his paternal grandmother who is his long-term caretaker and with whom he lives. His personal care assistant is present during a portion of the visit and provides transportation.  Past medical history, medications & allergies, family & social history reviewed and updated as appropriate.  Jonathan [name] has autism and has a new onset seizure disorder as of July 2016. He was hospitalized Sept 20th for 2 days for seizure and associated right middle and lower lobe pneumonia versus aspiration pneumonia. He has completed treatment with Augmentin and is doing well, back at school. GM reports he is sleeping like his usual self and has a good appetite.  She reports continuing his chronic medications but reports his bouts of psychotic behavior are increasing with vocal outbursts, jumping, etc. He is not violent and she reports being able to manage at home with her current support of school and PCA/respite care. New behavior is his repeatedly patting on his right ear with his hand for the past week. She report only minor intermittent redness to the ear from the behavior and states she is able to direct him away form doing the patting. No significant injury. She does ask MD to check to make sure there is not infection causing him pain and triggering the behavior. She states she thinks he is mimicking a behavior observed from school.  Occasional allergy symptoms controlled by prn use of cetirizine.  Review of Systems  Psychiatric/Behavioral: Positive for sleep disturbance (chronic), self-injury (only minor redness to ear from the patting) and agitation (chronic).  All other systems reviewed and are negative.      Objective:   Physical Exam  Constitutional: He appears well-developed and well-nourished. No distress.  HENT:   Head: Normocephalic.  Right Ear: External ear normal.  Left Ear: External ear normal.  Nose: Nose normal.  Mouth/Throat: Oropharynx is clear and moist.  Eyes: Conjunctivae and EOM are normal. Right eye exhibits no discharge. Left eye exhibits no discharge.  Neck: Normal range of motion. Neck supple.  Cardiovascular: Normal rate, regular rhythm and normal heart sounds.   No murmur heard. Pulmonary/Chest: Effort normal and breath sounds normal. No respiratory distress. He has no wheezes. He has no rales.  Abdominal: Soft. Bowel sounds are normal. He exhibits no distension and no mass. There is no tenderness.  Neurological: He is alert.  Skin: Skin is warm and dry. No rash noted.  Nursing note and vitals reviewed.      Assessment:     1. Seizure disorder (HCC)   2. Need for vaccination   3. Autism   4. Allergic rhinitis, unspecified allergic rhinitis type   Seizure disorder currently controlled and he has follow-up scheduled with neurology. No ear infection; patting is likey a repetitive disorder typical of his autism. Report of increased autistic bahavior of concern and possible that he needs a medication change.    Plan:     Orders Placed This Encounter  Procedures  . Flu Vaccine QUAD 36+ mos IM    May also give if preservative vaccine is unavailable  Counseled grandmother on vaccine; she voiced understanding and consent.  Okay to continue prn use of cetirizine. GM stated refill currently not needed.  Reminded GM of upcoming neurology appointment and advised her to discuss medication change from Risperdal to a  different one for control of behaviors. May prefer referral to psychiatry. Follow up as needed and in 6 months.  Maree ErieStanley, Tacarra Justo J, MD

## 2014-12-19 ENCOUNTER — Ambulatory Visit: Payer: Medicare Other | Admitting: Neurology

## 2015-01-12 ENCOUNTER — Ambulatory Visit (INDEPENDENT_AMBULATORY_CARE_PROVIDER_SITE_OTHER): Payer: Medicare Other | Admitting: Neurology

## 2015-01-12 ENCOUNTER — Encounter: Payer: Self-pay | Admitting: Neurology

## 2015-01-12 VITALS — BP 138/96 | HR 96 | Resp 20 | Ht 69.0 in | Wt 116.0 lb

## 2015-01-12 DIAGNOSIS — G40812 Lennox-Gastaut syndrome, not intractable, without status epilepticus: Secondary | ICD-10-CM | POA: Diagnosis not present

## 2015-01-12 DIAGNOSIS — G808 Other cerebral palsy: Secondary | ICD-10-CM

## 2015-01-12 DIAGNOSIS — G40419 Other generalized epilepsy and epileptic syndromes, intractable, without status epilepticus: Secondary | ICD-10-CM | POA: Diagnosis not present

## 2015-01-12 MED ORDER — PERAMPANEL 2 MG PO TABS: 2.0000 mg | ORAL_TABLET | Freq: Every evening | ORAL | Status: DC

## 2015-01-12 MED ORDER — FYCOMPA 0.5 MG/ML PO SUSP: 2.0000 mg | Freq: Every evening | ORAL | Status: DC

## 2015-01-12 NOTE — Progress Notes (Signed)
Provider:  Melvyn Novasarmen  Kailani Brass, M D  Referring Provider: Maree ErieStanley, Angela J, MD Primary Care Physician:  Maree ErieStanley, Angela J, MD  Chief Complaint  Patient presents with  . Follow-up    having trouble walking, walks on tiptoes, doesn't want to get off of bus, takes several people to help him off the bus, pt's grandmother thinks he may have weakness from his seizures, wants to disucss side effects to seizure medications, rm 10, alone  severe mental retardation, autism, severe disability.  HPI:  Jonathan [name] is a 19 y.o. male . seen here as a referral from PCP, Dr. Duffy RhodyStanley for neurological seizure evaluation,  Jonathan [name] carries a diagnosis of autism with severe cognitive and intellectual delay. He had never had seizures until July 27 when his mother witnessed a generalized seizure. With an arrival time at the ER he developed a second seizure which was witnessed by staff. The first seizure at home lasted for probably less than a minute as tonic-clonic convulsions but he did not lose consciousness following the seizure he appeared postictal and very drowsy. The patient had a similar event was in 2 hours later in the emergency room witnessed by a PA. The on-call neurologist decided to give Her loading dose IV. The patient was brought in for CT of the head which did not show any acute changes there was no evidence of a bleed, infection, swelling. Had not been back to baseline before the second seizure occurred so this would be considered a status epilepticus. The patient is usually noncommunicative and nonverbal due to his autism. His primary care physician is Dr. Delila SpenceAngela Stanley I asked his mother who was attending this visit if we can obtain appropriate previous diagnostic records and test results regarding [name]. Apparently he was tested in [place] where he was born and diagnosed with cerebral palsy, later worked up for dysphagia and chronic constipation. The patient is very slender and appears  younger than his American age.  His grandmother moved him  to the [place] area but [facility] , Dr Christance(?)and Dr. Glenna FellowsMichael Block, GI, and [place].  Jonathan [name] had a dramatic birth history. His mother suffered from a sepsis during pregnancy and died after meningitis and she was brain dead but left on life support measures as she was pregnant Jonathan [name] was taken from the warm at gestational age the seventh month of pregnancy. His mother had been comatose since the fifth week of pregnancy. He weighted 2 pounds and 2 ounces and  fit into the hand of his father . He stayed for a long time in the NICU until released. At age [age] year he suffered a bout of pneumonia. He never became fully verbal he is usually nonverbal but produces sounds that he repeats continuously. He learned crawling at 9-10 months ,walking at 14 months but was always tiptoeing.   Jonathan [name] has is baseline some visual impairment but I don't know how to grade this on a test ,given his low ability to cooperate with a vision test / visual field test.  History from 01-12-15 Since I have seen [name] last time about 15 months ago he has significantly changed in his ability to cognitively connect, follow guidance. He still performs repetitively the same tasks and movements but he is completely nonverbal. September he was admitted to hospital after a bout of seizures and stayed for 3 days. He had multiple tests there was no  infection found no inflammation no injury. His seizures were just attributed to his autism and underlying disorder. He did develop pneumonia after seizure probably aspiration related. In the meantime his family has noted that he is more rigid he is no longer able to climb stairs, or at least not reliably so. This did not used to be a problem last year. His right foot has slightly inverted and his right knee seems to be permanently flexed and not extended. He also has a  much higher ankle tone. He performs repetitively movements with his right hand.  His appetite has not changed , he is a "good eater" according to mom. But he eats with long pauses, seems to stare off, unable to handle the food- he has begun to drool which was not the case last year either. He dropped frequently . This seems to encompass all seizure types seen in [place] Gestaut syndrome. He has staring attacks, drop attacks, convulsions.  i am reviewing with mom and caretaker the imaging studies and lab work. July and September. On Keppra.  I demonstrated the recent MRI findings. [name] does have an abnormal corpus callosum, but I don't see any scar tissue in the subcortical areas. This cannot explain why his right foot is inverted unless he had some kind of more peripheral injury there is no evidence of stroke or abscess etc. What I would like to do is to consider [name] for a VNS stimulator. There is also [name] specific medication that he has not been placed on.   Review of Systems: Out of a complete 14 system review, the patient complains of only the following symptoms, and all other reviewed systems are negative.;  Born with developmental deficits. autism , non verbal, MRDD -  Social History   Social History  . Marital Status: Single    Spouse Name: N/A  . Number of Children: N/A  . Years of Education: N/A   Occupational History  . Not on file.   Social History Main Topics  . Smoking status: Never Smoker   . Smokeless tobacco: Not on file  . Alcohol Use: No  . Drug Use: No  . Sexual Activity: No   Other Topics Concern  . Not on file   Social History Narrative    Family History  Problem Relation Age of Onset  . Obesity Other   . Stroke Other   . Hypertension Other   . Arthritis Paternal Grandmother   . Seizures Maternal Uncle     Past Medical History  Diagnosis Date  . Autism   . Seizure (HCC) 08/24/2014  . Mental retardation, idiopathic severe     History  reviewed. No pertinent past surgical history.  Current Outpatient Prescriptions  Medication Sig Dispense Refill  . cetirizine (ZYRTEC) 1 MG/ML syrup Take 10 mls (10 mg) by mouth daily at bedtime as needed for allergy symptom control 240 mL 6  . levETIRAcetam (KEPPRA) 100 MG/ML solution Take 7.5 mLs (750 mg total) by mouth 2 (two) times daily. 473 mL 3  . risperiDONE (RISPERDAL) 1 MG/ML oral solution TAKE 0.5 MLS BY MOUTH DAILY AT BEDTIME FOR CONTROL OF AGGITATION 30 mL 11   No current facility-administered medications for this visit.    Allergies as of 01/12/2015  . (No Known Allergies)    Vitals: BP 138/96 mmHg  Pulse 96  Resp 20  Ht 5\' 9"  (1.753 m)  Wt 116 lb (52.617 kg)  BMI 17.12 kg/m2 Last Weight:  Wt Readings from Last  1 Encounters:  01/12/15 116 lb (52.617 kg)   Last Height:   Ht Readings from Last 1 Encounters:  01/12/15  (1.753 m)    Physical exam:  General: The patient is awake, alert and appears not in acute distress. The patient is well groomed. Head: Normocephalic, atraumatic. Neck is supple. Mallampati 2, neck circumference:13  Cardiovascular:  Regular rate and rhythm , without  murmurs or carotid bruit, and without distended neck veins. Respiratory: Lungs are clear to auscultation. Skin:  Without evidence of edema, or rash Trunk: BMI is normal posture.  Neurologic exam : The patient is awake and alert, oriented to place and time.  Memory subjective described as intact.  There is a normal attention span & concentration ability. Speech is absent , no words are formed.non verbal repetition.   Mood and affect are aloof .  Cranial nerves: Pupils are equal and briskly reactive to light. Funduscopic exam without evidence of pallor or edema.  Extraocular movements  in vertical and horizontal planes intact and without nystagmus.  Visual fields by finger perimetry are intact. Hearing to finger rub intact.  Facial sensation appers intact to fine touch.  Facial motor strength is symmetric, but he moves more on the right. The tongue and uvula move midline.  He is incessantly moving. Tongue protrusion was not provided. He has well formed muscle bulk for the muscles of mastication.. Shoulder shrug is normal.   Motor exam:   Sensory:  Fine touch, pinprick and vibration ; the patient cannot assist with this exam. Coordination: Rapid alternating movements in the fingers/hands were not tested due to autism, observation - he moves his left hand much less than his right and hazardous fisted hand he also places his thumb between the index and middle finger. He has a pronation at baseline also speaks for an early childhood or interpregnancy injury and follow for cerebral palsy. The left leg muscles have less bulk than the right especially at the thigh. His left quadriceps is smaller  than his right .Finger-to-nose maneuver can not be performed. Gait and station: Patient walks without assistive device and is able unassisted to climb up to the exam table.  Strength within normal limits. Stance is stable and normal. Tandem gait is unfragmented. Romberg testing isnegative   Deep tendon reflexes: in the  upper and lower extremities are asymmetric , left brisk with 2 beat clonus at ankle.   Babinski maneuver response is down going right and intestable left.    Assessment:  After physical and neurologic examination, review of laboratory studies, imaging, neurophysiology testing and pre-existing records, assessment of 60 minutes for this difficult patient in with  developmental delay, cerebral palsy and pervasive disorder.   He also had a normal CMP and CBC with differential on 08-24-14.       Plan:  Treatment plan and additional workup :   60 minute revisit after several hospitalisations in the interval period, well-appearing cognitive decline, and more frequent seizures. I'm concerned that his seizures are in the [facility] complex.  The patient is  maintained on Risperdal which is treating mostly behavior issues - I am unsure if this medication can continue .  While he did have 2 unexplained seizures  2015 , the seizure activity has progressed since then more frequent and Clerence seems to have lost some milestones.  He is less able to ambulate safely he seems to have frequent interruptions within activities-  even while he is eating-  that could be seizures staring  attacks.  He also had some tonic drop attacks and convulsive seizures. I think it is necessary to change his medication to an O start specific medication and in addition I would like for him to be established as a possible VNS candidate. MRI reviewed.  - no bleed trauma or sepsis identified. However is underlying condition makes him prone to have epileptic seizures and seizures are more common in the autistic population as well as in the cerebral palsy population. Remains for now on Keppra. I will start FYCOMPA, at 2 mg liquid form given at bedtime once a day only. After 14 days I would like a call back abut his development on the medication- Shoukd he have still seizures I will  him to increase the dose from 2 mg to 4 mg nightly only.  We will then see if his seizure frequency is reduced or not and rely on his families report.want      RV in 2 month for a 60 minute visit, last of the morning.     CC: Dr [name] .    Porfirio Mylar Mazzy Santarelli MD 01/12/2015

## 2015-01-12 NOTE — Patient Instructions (Addendum)
Perampanel oral suspension What is this medicine? PERAMPANEL (per AM pa nel) is used to treat certain types of seizures in patients with epilepsy. This medicine may be used for other purposes; ask your health care provider or pharmacist if you have questions. What should I tell my health care provider before I take this medicine? They need to know if you have any of these conditions: -drug abuse or addiction -history of a drug or alcohol abuse problem -history of depression or bipolar disorder -if you often drink alcohol -kidney disease -liver disease -mental illness -suicidal thoughts, plans, or attempt; a previous suicide attempt by you or a family member -an unusual or allergic reaction to perampanel, other medicines, foods, dyes, or preservatives -pregnant or trying to get pregnant -breast-feeding How should I use this medicine? Take this medicine by mouth. Follow the directions on the prescription label. Shake well before using. Use a specially marked spoon or dropper to measure each dose. Ask your pharmacist if you do not have one. Household spoons are not accurate. You can take it with or without food. If it upsets your stomach, take it with food. Take your medicine at regular intervals. Do not take it more often than directed. Do not stop taking except on your doctor's advice. A special MedGuide will be given to you by the pharmacist with each prescription and refill. Be sure to read this information carefully each time. Talk to your pediatrician regarding the use of this medicine in children. While this drug may be prescribed for children as young as 12 years for selected conditions, precautions do apply. Overdosage: If you think you have taken too much of this medicine contact a poison control center or emergency room at once. NOTE: This medicine is only for you. Do not share this medicine with others. What if I miss a dose? If you miss a dose, take it as soon as you can. If it is  almost time for your next dose, take only that dose. Do not take double or extra doses. What may interact with this medicine? This medicine may interact with the following medications: -alcohol -antihistamines for allergy, cough and cold -certain birth control pills containing levonorgestrel -certain medicines for anxiety or sleep -certain medicines for depression like amitriptyline, fluoxetine, sertraline -certain medicines for fungal infections like ketoconazole -certain medicines for seizures like carbamazepine, eslicarbazine, oxcarbazepine, phenobarbital, phenytoin, primidone, topiramate -general anesthetics like halothane, isoflurane, methoxyflurane, propofol -medicines that relax muscles for surgery -narcotic medicines for pain -phenothiazines like chlorpromazine, mesoridazine, prochlorperazine, thioridazine -rifampin -St. John's wort This list may not describe all possible interactions. Give your health care provider a list of all the medicines, herbs, non-prescription drugs, or dietary supplements you use. Also tell them if you smoke, drink alcohol, or use illegal drugs. Some items may interact with your medicine. What should I watch for while using this medicine? Tell your doctor or healthcare professional if your symptoms do not start to get better or if they get worse. Wear a medical ID bracelet or chain, and carry a card that describes your disease and details of your medicine and dosage times. It is important to take this medicine exactly as directed. When first starting treatment, your dose will need to be adjusted slowly. It may take weeks or months before your dose is stable. You should contact your doctor or healthcare professional if your seizures get worse or if you have any new types of seizures. Do not stop taking this medicine unless instructed by your doctor  or healthcare professional. Stopping your medicine suddenly can increase your seizures or their severity. You may  get drowsy or dizzy. Do not drive, use machinery, or do anything that needs mental alertness until you know how this medicine affects you. Do not stand or sit up quickly, especially if you are an older patient. This reduces the risk of dizzy or fainting spells. Alcohol may interfere with the effect of this medicine. Avoid alcoholic drinks. Some birth control pills may not work properly while you are taking this medicine. Talk to your doctor about using an extra method of birth control. What side effects may I notice from receiving this medicine? Side effects that you should report to your doctor or health care professional as soon as possible: -allergic reactions like skin rash, itching or hives, swelling of the face, lips, or tongue -anger -anxious -depressed mood -extreme increases in activity and talking -hostile or violent behavior -irritable -suicidal thoughts or other mood changes -trouble sleeping Side effects that usually do not require medical attention (report these to your doctor or health care professional if they continue or are bothersome): -dizziness -headache -loss of balance or coordination -nausea, vomiting -stomach pain -tiredness -weight gain This list may not describe all possible side effects. Call your doctor for medical advice about side effects. You may report side effects to FDA at 1-800-FDA-1088. Where should I keep my medicine? Keep out of the reach of children. This medicine can be abused. Keep your medicine in a safe place to protect it from theft. Do not share this medicine with anyone. Selling or giving away this medicine is dangerous and against the law. This medicine may cause accidental overdose and death if it taken by other adults, children, or pets. Mix any unused medicine with a substance like cat litter or coffee grounds. Then throw the medicine away in a sealed container like a sealed bag or a coffee can with a lid. Throw away any unused medicine  after 90 days. Store at room temperature between 15 and 30 degrees C (59 and 86 degrees F). NOTE: This sheet is a summary. It may not cover all possible information. If you have questions about this medicine, talk to your doctor, pharmacist, or health care provider.    2016, Elsevier/Gold Standard. (2014-07-11 17:28:29)   2 mg liquid medication equal 4 mL of liquid. Please give this medication to your son in the evening or at nighttime only once a day for the next 14 days. If well tolerated we will continue with the medication at this dose, if he still has seizures we have to double the dose and go to 8 mL or 4 mg liquid medication at night. Please contact me with your observations.

## 2015-01-25 ENCOUNTER — Telehealth: Payer: Self-pay

## 2015-01-25 NOTE — Telephone Encounter (Signed)
Spoke to pt's grandmother about pt's seizures. She reports that he has not had a seizure since he was in the hospital last. She reports that she has not gotten the Fycompa yet. I told her that I would check into this. I also advised her that pt's information was passed on the the VNS rep, Doran DurandSteven Bonanza, as discussed in the office visit and he will be calling them at the beginning of next week. Pt's mother verbalized understanding.

## 2015-01-25 NOTE — Telephone Encounter (Signed)
I called the pharmacy and spoke with Trinna PostAlex.  They cannot seem to locate the Rx, and must have destroyed it in error.  Verified Rx verbally.  Says they do not normally carry this drug, so it will have to be ordered.  They will notify patient when meds arrive.  I called and spoke with Ms [name].  Relayed this info.  She expressed understanding and appreciation.

## 2015-01-31 ENCOUNTER — Ambulatory Visit: Payer: Medicare Other | Admitting: Neurology

## 2015-02-16 NOTE — Telephone Encounter (Signed)
Valerie/[employer] [phone] ext [phone] called regarding VNS, needed to verify patient's next appointment, mother gave 02/21/15 and 02/23/15, verified 02/23/15 1:45pm.

## 2015-02-23 ENCOUNTER — Ambulatory Visit (INDEPENDENT_AMBULATORY_CARE_PROVIDER_SITE_OTHER): Payer: Medicare Other | Admitting: Neurology

## 2015-02-23 ENCOUNTER — Encounter: Payer: Self-pay | Admitting: Neurology

## 2015-02-23 ENCOUNTER — Encounter (INDEPENDENT_AMBULATORY_CARE_PROVIDER_SITE_OTHER): Payer: Self-pay

## 2015-02-23 VITALS — Ht 69.0 in | Wt 114.0 lb

## 2015-02-23 DIAGNOSIS — F840 Autistic disorder: Secondary | ICD-10-CM | POA: Diagnosis not present

## 2015-02-23 DIAGNOSIS — I6789 Other cerebrovascular disease: Secondary | ICD-10-CM | POA: Diagnosis not present

## 2015-02-23 DIAGNOSIS — G40909 Epilepsy, unspecified, not intractable, without status epilepticus: Secondary | ICD-10-CM

## 2015-02-23 DIAGNOSIS — F8189 Other developmental disorders of scholastic skills: Secondary | ICD-10-CM

## 2015-02-23 NOTE — Patient Instructions (Signed)
VNS information provided by [facility] Council, patient will have next appointment with Neurosurgeon.

## 2015-02-23 NOTE — Addendum Note (Signed)
Addended by: Melvyn Novas on: 02/23/2015 02:30 PM   Modules accepted: Orders

## 2015-02-23 NOTE — Progress Notes (Signed)
Provider:  Melvyn Novas, M D  Referring Provider: Maree Erie, MD Primary Care Physician:  Maree Erie, MD  Chief Complaint  Patient presents with  . Follow-up    no seizures since last here, fycompa too with risperdal, rm 10, with caregiver and grandmother  severe mental retardation, autism, severe disability.  HPI:  Jonathan [name] is a 20 y.o. male . seen here as a referral from PCP, Dr. [name] for neurological seizure evaluation,  Jonathan [name] carries a diagnosis of autism with severe cognitive and intellectual delay. He had never had seizures until July 27 when his mother witnessed a generalized seizure. With an arrival time at the ER he developed a second seizure which was witnessed by staff. The first seizure at home lasted for probably less than a minute as tonic-clonic convulsions but he did not lose consciousness following the seizure he appeared postictal and very drowsy. The patient had a similar event was in 2 hours later in the emergency room witnessed by a PA. The on-call neurologist decided to give Her loading dose IV. The patient was brought in for CT of the head which did not show any acute changes there was no evidence of a bleed, infection, swelling. Had not been back to baseline before the second seizure occurred so this would be considered a status epilepticus. The patient is usually noncommunicative and nonverbal due to his autism. His primary care physician is Dr. Delila [name] I asked his mother who was attending this visit if we can obtain appropriate previous diagnostic records and test results regarding Jonathan [name] health. Apparently he was tested in [place] where he was born and diagnosed with cerebral palsy, later worked up for dysphagia and chronic constipation. The patient is very slender and appears younger than his American age.  His grandmother moved him  to the [place] area but he has seen physicians at [facility] , Dr Christance(?)and [name], GI, and seen  in [place],  [place].  Jonathan [name] had a dramatic birth history. His mother suffered from a sepsis during pregnancy and died after meningitis and she was brain dead but left on life support measures as she was pregnant Jonathan [name] was taken from the warm at gestational age the seventh month of pregnancy. His mother had been comatose since the fifth week of pregnancy. He weighted 2 pounds and 2 ounces and  fit into the hand of his father . He stayed for a long time in the NICU until released. At age [age] year he suffered a bout of pneumonia. He never became fully verbal he is usually nonverbal but produces sounds that he repeats continuously. He learned crawling at 9-10 months ,walking at 14 months but was always tiptoeing.   Jonathan [name] has is baseline some visual impairment but I don't know how to grade this on a test ,given his low ability to cooperate with a vision test / visual field test.  History from 01-12-15 Since I have seen Mr. Jonathan [name] last time about 15 months ago he has significantly changed in his ability to cognitively connect, follow guidance. He still performs repetitively the same tasks and movements but he is completely nonverbal. September he was admitted to hospital after a bout of seizures and stayed for 3 days. He had multiple tests there was no infection found no inflammation no injury. His seizures were just attributed to his autism and underlying disorder. He did develop pneumonia after seizure probably aspiration related. In the  meantime his family has noted that he is more rigid he is no longer able to climb stairs, or at least not reliably so. This did not used to be a problem last year. His right foot has slightly inverted and his right knee seems to be permanently flexed and not extended. He also has a much higher ankle tone. He performs repetitively movements with his right hand.  His appetite has not changed , he is a "good eater" according to mom. But he  eats with long pauses, seems to stare off, unable to handle the food- he has begun to drool which was not the case last year either. He dropped frequently . This seems to encompass all seizure types seen in [place] Gestaut syndrome. He has staring attacks, drop attacks, convulsions.  i am reviewing with mom and caretaker the imaging studies and lab work. July and September. On Keppra.  I demonstrated the recent MRI findings. Jonathan [name] does have an abnormal corpus callosum, but I don't see any scar tissue in the subcortical areas. This cannot explain why his right foot is inverted unless he had some kind of more peripheral injury there is no evidence of stroke or abscess etc. What I would like to do is to consider Jonathan [name] for a VNS stimulator. There is also Jonathan [name] specific medication that he has not been placed on thus far. The patient is maintained on Risperdal which is treating mostly behavior issues - I am unsure if this medication can continue .  While he did have 2 unexplained seizures  2015 , the seizure activity has progressed since then more frequent and Jonathan [name] seems to have lost some milestones.  He is less able to ambulate safely he seems to have frequent interruptions within activities-  even while he is eating-  that could be seizures staring attacks.  He also had some tonic drop attacks and convulsive seizures. I think it is necessary to change his medication to an O start specific medication and in addition I would like for him to be established as a possible VNS candidate. MRI reviewed.  - no bleed trauma or sepsis identified. However is underlying condition makes him prone to have epileptic seizures and seizures are more common in the autistic population as well as in the cerebral palsy population. Remains for now on Keppra. I will start FYCOMPA, at 2 mg liquid form given at bedtime once a day only. After 14 days I would like a call back abut his development on the medication- Shoukd he have  still seizures I will  him to increase the dose from 2 mg to 4 mg nightly only.  We will then see if his seizure frequency is reduced or not and rely on his families report. His mother keeps a journal.     02-23-2015 Jonathan [name] is seen here today for a right scheduled follow-up visit. After our last visit 6 weeks ago he was started on file comp. He did well on file comp alone and seems not to have had more seizure activity but when he restarted or talk Risperdal this fight, he became very sedated, begun drooling, was more difficult to guide. His he was less ambulatory as well -almost a little bit" drugged". The in women's lives on an upstairs apartment and he could not manage to walk the stairs up. For this reason his primary care physician tried to reduce the Risperdal first seeing if he could tolerate the medication but this led to him being more  aggressive and easily agitated. The question is now if he can reduce affect compared to a lower level without sacrificing seizure control. We have also discussed the implantation of a VNS stimulator. Since the VNS stimulator is not a medication and free of side effect - and may be able to support seizure control even if Fycompa is only tolerated or fully effective at a lower dose.      Review of Systems: Out of a complete 14 system review, the patient complains of only the following symptoms, and all other reviewed systems are negative.;  Born with developmental deficits. autism , non verbal, MRDD -  Past Medical History  Diagnosis Date  . Autism   . Seizure (HCC) 08/24/2014  . Mental retardation, idiopathic severe     No past surgical history on file.  Current Outpatient Prescriptions  Medication Sig Dispense Refill  . cetirizine (ZYRTEC) 1 MG/ML syrup Take 10 mls (10 mg) by mouth daily at bedtime as needed for allergy symptom control 240 mL 6  . FYCOMPA 0.5 MG/ML SUSP Take 2 mg by mouth Nightly. 2 mg liquid medication equal 4 mL of liquid.  Please give this medication to your son in the evening or at nighttime only once a day for the next 14 days. If well tolerated we will continue with the medication at this dose, if he still has seizures we have to double the dose and go to 8 mL or 4 mg liquid medication at night. Please contact me with your observations. 340 mL 3  . levETIRAcetam (KEPPRA) 100 MG/ML solution Take 7.5 mLs (750 mg total) by mouth 2 (two) times daily. 473 mL 3  . risperiDONE (RISPERDAL) 1 MG/ML oral solution Take 0.5 mg by mouth 2 (two) times daily.     No current facility-administered medications for this visit.    Allergies as of 02/23/2015  . (No Known Allergies)    Vitals: BP   Pulse   Resp   Ht 5\' 9"  (1.753 m)  Wt 114 lb (51.71 kg)  BMI 16.83 kg/m2 Last Weight:  Wt Readings from Last 1 Encounters:  02/23/15 114 lb (51.71 kg)   Last Height:   Ht Readings from Last 1 Encounters:  02/23/15 5\' 9"  (1.753 m)    Physical exam:  General: The patient is awake, alert and appears not in acute distress. The patient is well groomed. Head: Normocephalic, atraumatic. Neck is supple. Mallampati 2, neck circumference:13  Cardiovascular:  Regular rate and rhythm , without  murmurs or carotid bruit, and without distended neck veins. Respiratory: Lungs are clear to auscultation. Skin:  Without evidence of edema, or rash Trunk: BMI is normal posture.  Neurologic exam : The patient is awake and alert, oriented to place and time.  Memory subjective described as intact.  There is a normal attention span & concentration ability. Speech is absent , no words are formed.non verbal repetition.   Mood and affect are aloof .  Cranial nerves: Pupils are equal and briskly reactive to light. Funduscopic exam without evidence of pallor or edema.  Extraocular movements  in vertical and horizontal planes intact and without nystagmus.  Visual fields by finger perimetry are intact. Hearing to finger rub intact.  Facial  sensation appers intact to fine touch. Facial motor strength is symmetric, but he moves more on the right. The tongue and uvula move midline.  He is incessantly moving. Tongue protrusion was not provided. He has well formed muscle bulk for the muscles of mastication.. Shoulder  shrug is normal.   Motor exam:   Sensory:  Fine touch, pinprick and vibration ; the patient cannot assist with this exam. Coordination: Rapid alternating movements in the fingers/hands were not tested due to autism, observation - he moves his left hand much less than his right and hazardous fisted hand he also places his thumb between the index and middle finger. He has a pronation at baseline also speaks for an early childhood or interpregnancy injury and follow for cerebral palsy. The left leg muscles have less bulk than the right especially at the thigh. His left quadriceps is smaller  than his right .Finger-to-nose maneuver can not be performed. Gait and station: Patient walks without assistive device and is able unassisted to climb up to the exam table.  Strength within normal limits. Stance is stable and normal. Tandem gait is unfragmented. Romberg testing isnegative   Deep tendon reflexes: in the  upper and lower extremities are asymmetric , left brisk with 2 beat clonus at ankle.   Babinski maneuver response is down going right and intestable left.    Assessment:  After physical and neurologic examination, review of laboratory studies, imaging, neurophysiology testing and pre-existing records, assessment of 60 minutes for this difficult patient in with  developmental delay, cerebral palsy and pervasive disorder.   He also had a normal CMP and CBC with differential on 08-24-14. The patient presents today alert and verbal, but is ot using words. He is not making eye contact. His grandmother has d/c FYCOMPA after 14 days instead of giving [place] a phone report and continue or increase the dose. There was obviously no  communication. I will not restart the Fycompa , given the significant side effects, and instead have Jonathan [name] evaluated for a VNS stimulator.  His grandmother is the main caregiver and medication giver. She will be less overwhelmed with a VNS magnet swap.  MRI reviewed.  - no bleed trauma or sepsis identified. However is underlying condition makes him prone to have epileptic seizures and seizures are more common in the autistic population as well as in the cerebral palsy population.    Plan:  Treatment plan and additional workup :  20 year old patient with severe premature birth history, mother was in a meningitis - went into coma and died- he was delivered in [place] while his mother was on life support - born at just above  2 pounds weight.,  and raised by Jonathan [name] from [place] , [place]. No hospitalization since last visit. Grandmother reported he was too drowsy to go to school , walk steady, the school bus driver and his teachers felt he couldn't function on the low dose of Fycompa,  Was unable walk to the apartement.   I'm still concerned that his seizures are in the [facility] complex. I will not restart Fycompa because of the drowsiness.   The patient is maintained on Risperdal which is treating mostly behavior issues - .  While he did have 2 unexplained seizures  2015 , the seizure activity has progressed to more to 10 seizures in 2016 , none yet in 2017.  After each seizure Jonathan [name] seemed to have lost some milestones.  He is less able to ambulate safely he seems to have frequent interruptions within activities-  even while he is eating-  that could be seizures staring attacks. He also had some tonic drop attacks and convulsive seizures.  I would like for him to be established as a possible VNS candidate. We will then see  if his seizure frequency is reduced or not and rely on his families report. His mother keeps a journal.    His grandmother was given additional information today, shown a model  of the VNS, procedure is ambulatory , duration 2 hours. Dr.Nellish Nunkumar, neurosurgeon will be asked to do the surgery.      RV in 2 month with VNS ,  for a 60 minute visit to stimulate the  VNS ., give him last Appointment of the morning or the evening.     CC: Dr Jonathan [name].  Cc: Dr. Patric [name] ,  Jonathan Maiers, RN at    [facility] MD 02/23/2015

## 2015-03-01 ENCOUNTER — Ambulatory Visit (INDEPENDENT_AMBULATORY_CARE_PROVIDER_SITE_OTHER): Payer: Medicare Other | Admitting: Neurology

## 2015-03-01 ENCOUNTER — Encounter (INDEPENDENT_AMBULATORY_CARE_PROVIDER_SITE_OTHER): Payer: Self-pay

## 2015-03-01 DIAGNOSIS — R259 Unspecified abnormal involuntary movements: Secondary | ICD-10-CM

## 2015-03-01 DIAGNOSIS — G40909 Epilepsy, unspecified, not intractable, without status epilepticus: Secondary | ICD-10-CM

## 2015-03-01 DIAGNOSIS — I6789 Other cerebrovascular disease: Secondary | ICD-10-CM

## 2015-03-01 DIAGNOSIS — F840 Autistic disorder: Secondary | ICD-10-CM

## 2015-03-01 DIAGNOSIS — F8189 Other developmental disorders of scholastic skills: Secondary | ICD-10-CM

## 2015-03-07 ENCOUNTER — Other Ambulatory Visit: Payer: Self-pay | Admitting: Pediatrics

## 2015-03-10 NOTE — Procedures (Signed)
   [facility]  EEG (ELECTROENCEPHALOGRAM) REPORT   STUDY DATE: 03/01/15 PATIENT NAME: Jonathan [name] DOB: 20-Jun-1995 MRN: [id number]  ORDERING CLINICIAN: Melvyn Novas, MD   TECHNOLOGIST: Gearldine Shown  TECHNIQUE: Electroencephalogram was recorded utilizing standard 10-20 system of lead placement and reformatted into average and bipolar montages.  RECORDING TIME: 32 minutes ACTIVATION: photic stimulation  CLINICAL INFORMATION: 20 year old male with autism, developmental delay, intellectual disability. Evaluate for involuntary movements (seizure vs behavior spells).  FINDINGS: Background rhythms of 10-12 hertz and 10-15 microvolts. Intermittent eye blinking artifacts noted. Intermittent muscle artifact with rhythmic head movements noted. Patient was also uncooperative during study, pulling EEG leads off of his scalp. No definite epileptiform discharges noted. No focal, lateralizing, epileptiform activity or seizures are seen. Patient recorded in the awake state.    IMPRESSION:  Normal EEG in awake state. Intermittent eye blink and rhythmic muscle artifacts noted.    INTERPRETING PHYSICIAN:  Suanne Marker, MD Certified in Neurology, Neurophysiology and Neuroimaging  [facility] [address] [place], [place] [address] [phone]

## 2015-03-13 ENCOUNTER — Telehealth: Payer: Self-pay

## 2015-03-13 NOTE — Addendum Note (Signed)
Addended by: Melvyn Novas on: 03/13/2015 01:14 PM   Modules accepted: Orders

## 2015-03-13 NOTE — Telephone Encounter (Signed)
EEG results were normal per Dr. Vickey Huger. Received notification from Dr. Val Riles office that pt has been scheduled for a consult for 03/15/2015. Dr. Vickey Huger asked me to call the pt's grandmother and cancel this appt with Dr. Conchita Paris since the EEG is normal. I spoke with pt's grandmother and advised her that the EEG was normal and that the appt with Dr. Conchita Paris will be cancelled. PT's grandmother verbalized understanding. I then called Thayer Ohm, new patient coordinator at [facility] and asked her to cancel the appt on 2/15 at 2:00 for this pt. Thayer Ohm advised me to call back and reschedule pt if needed. Viviann Spare, VNS coordinator aware.

## 2015-03-16 ENCOUNTER — Telehealth: Payer: Self-pay | Admitting: Neurology

## 2015-03-16 NOTE — Telephone Encounter (Signed)
Talmage Nap called regarding risperiDONE (RISPERDAL) 1 MG/ML oral solution, states patient has been without medication 2-3 weeks, also out of levETIRAcetam (KEPPRA) 100 MG/ML solution, states Dr. [name], PCP was supposed to call Dr. Vickey Huger to see what medications Dr. Vickey Huger was going to keep patient on.

## 2015-03-16 NOTE — Telephone Encounter (Signed)
Spoke to [employer], caregiver, per DPR. She said that Dr. [name] (pediatrician) had been prescribing risperdal solution and keppra solution for pt but was waiting for Dr. Oliva Bustard recommendations on whether they should change dosing on either of these medications before refilling them. She has not heard anything from Dr. Vickey Huger. Pt has been out of the risperdone and keppra for several weeks.  I advised pt's caregiver that Dr. Vickey Huger is out of the office at this time. I advised her to call Dr. Lafonda Mosses office and advise them that Dr. Vickey Huger is out of the office at this time, and perhaps Dr. [name] would refill the medications at the current dose until Dr. Vickey Huger comes back to the office and can discuss any dosage changes. Pt's caregiver verbalized understanding.  Do you recommend any dosage changes for the risperdal solution (take 0.5mg  by mouth twice daily) or the keppra solution (take 7.48mLs by mouth twice daily)?

## 2015-03-17 ENCOUNTER — Ambulatory Visit (INDEPENDENT_AMBULATORY_CARE_PROVIDER_SITE_OTHER): Payer: Medicare Other | Admitting: Pediatrics

## 2015-03-17 VITALS — Temp 98.4°F | Wt 112.8 lb

## 2015-03-17 DIAGNOSIS — F840 Autistic disorder: Secondary | ICD-10-CM | POA: Diagnosis not present

## 2015-03-17 DIAGNOSIS — G40909 Epilepsy, unspecified, not intractable, without status epilepticus: Secondary | ICD-10-CM | POA: Diagnosis not present

## 2015-03-17 DIAGNOSIS — R350 Frequency of micturition: Secondary | ICD-10-CM

## 2015-03-17 LAB — CBC WITH DIFFERENTIAL/PLATELET
BASOS ABS: 0 10*3/uL (ref 0.0–0.1)
Basophils Relative: 1 % (ref 0–1)
EOS PCT: 4 % (ref 0–5)
Eosinophils Absolute: 0.1 10*3/uL (ref 0.0–0.7)
HEMATOCRIT: 43.1 % (ref 39.0–52.0)
HEMOGLOBIN: 14.5 g/dL (ref 13.0–17.0)
LYMPHS ABS: 1.3 10*3/uL (ref 0.7–4.0)
LYMPHS PCT: 47 % — AB (ref 12–46)
MCH: 27.9 pg (ref 26.0–34.0)
MCHC: 33.6 g/dL (ref 30.0–36.0)
MCV: 82.9 fL (ref 78.0–100.0)
MPV: 9.6 fL (ref 8.6–12.4)
Monocytes Absolute: 0.2 10*3/uL (ref 0.1–1.0)
Monocytes Relative: 7 % (ref 3–12)
NEUTROS ABS: 1.1 10*3/uL — AB (ref 1.7–7.7)
Neutrophils Relative %: 41 % — ABNORMAL LOW (ref 43–77)
Platelets: 232 10*3/uL (ref 150–400)
RBC: 5.2 MIL/uL (ref 4.22–5.81)
RDW: 15 % (ref 11.5–15.5)
WBC: 2.8 10*3/uL — AB (ref 4.0–10.5)

## 2015-03-17 LAB — COMPREHENSIVE METABOLIC PANEL
ALBUMIN: 4.6 g/dL (ref 3.6–5.1)
ALK PHOS: 70 U/L (ref 40–115)
ALT: 14 U/L (ref 9–46)
AST: 18 U/L (ref 10–40)
BILIRUBIN TOTAL: 0.5 mg/dL (ref 0.2–1.2)
BUN: 14 mg/dL (ref 7–25)
CALCIUM: 9.4 mg/dL (ref 8.6–10.3)
CO2: 25 mmol/L (ref 20–31)
Chloride: 105 mmol/L (ref 98–110)
Creat: 0.64 mg/dL (ref 0.60–1.35)
GLUCOSE: 90 mg/dL (ref 65–99)
POTASSIUM: 4 mmol/L (ref 3.5–5.3)
Sodium: 140 mmol/L (ref 135–146)
TOTAL PROTEIN: 7.8 g/dL (ref 6.1–8.1)

## 2015-03-17 MED ORDER — RISPERIDONE 1 MG/ML PO SOLN: 0.5000 mg | Freq: Every day | ORAL | Status: DC

## 2015-03-17 MED ORDER — LEVETIRACETAM 100 MG/ML PO SOLN: 750.0000 mg | Freq: Two times a day (BID) | ORAL | Status: DC

## 2015-03-17 NOTE — Patient Instructions (Signed)
I will call you if there are problems with his blood tests.  Clean at his foreskin area to your best ability and apply the urine bag with the adhesive on the top of his penile shaft so that when he urinates the stream will direct into the bag. Place any collected specimen in the sterile urine cup and return to the office. Office is open until 5:15 during the week and noon on Saturday. I am working this Saturday.

## 2015-03-18 ENCOUNTER — Encounter: Payer: Self-pay | Admitting: Pediatrics

## 2015-03-18 NOTE — Progress Notes (Signed)
Subjective:     Patient ID: Jonathan [name], male   DOB: 10/19/1995, 19 y.o.   MRN: [id number]  HPI Jonathan [name] is here today with concern of possible UTI. He is accompanied by his grandmother (his primary caretaker) and his personal care assistant. Jonathan [name] has autism and does not speak.   Grandmother states she is concerned because he has been voiding more than usual but no fever or signs of pain. No vomiting or diarrhea. He has chronic incontinence due to his cognitive delay. Admits he has also been drinking more than usual during the day.  The other concern today is his behavior. GM states she is out of both his Keppra and his Risperdal. States she called the neurologist who informed her the Risperdal would need to be prescribed by his PCP; additionally, the RN informed GM the neurologist was out of the office for the week. GM states Jonathan [name]'s behavior is much more challenging without the Risperdal. States they are also reporting a change at school and his PCA reports the same. They state he is not sleeping well, is more active and will break away and run, and they state he will unexpectedly just stop and sit down (even if in an unacceptable place).  Past medical history, problem list, medications and allergies, family and social history reviewed and updated as indicated. Family members are well. In additional to autism, Jonathan [name] has a seizure disorder and a history of Enterococcal UTI once a year in 2015 & 2016.   Review of Systems  Constitutional: Negative for fever and appetite change.  HENT: Negative for congestion.   Eyes: Negative for redness.  Respiratory: Negative for cough.   Gastrointestinal: Negative for vomiting and diarrhea.  Skin: Negative for rash.  Psychiatric/Behavioral: Positive for sleep disturbance and agitation.       Objective:   Physical Exam  Constitutional: He appears well-developed and well-nourished. No distress.  HENT:  Right Ear: External ear normal.  Left Ear: External ear  normal.  Nose: Nose normal.  Eyes: Conjunctivae are normal. Right eye exhibits no discharge. Left eye exhibits no discharge.  Neck: Normal range of motion. Neck supple.  Cardiovascular: Normal rate and regular rhythm.   No murmur heard. Pulmonary/Chest: Effort normal and breath sounds normal.  Genitourinary: Penis normal.  He is not circumcised and foreskin is mobile but not retractile due to phimosis. He is in adult incontinence undergarments and he has LOTS of powder applied to the pubic and genital area  Nursing note and vitals reviewed.      Assessment:     1. Urinary frequency   2. Autism   3. Seizure disorder [facility])        Plan:     Orders Placed This Encounter  Procedures  . CBC with Differential/Platelet  . Comprehensive metabolic panel   Meds ordered this encounter  Medications  . levETIRAcetam (KEPPRA) 100 MG/ML solution    Sig: Take 7.5 mLs (750 mg total) by mouth 2 (two) times daily.    Dispense:  473 mL    Refill:  0  . risperiDONE (RISPERDAL) 1 MG/ML oral solution    Sig: Take 0.5 mLs (0.5 mg total) by mouth at bedtime. For management of aggitation    Dispense:  30 mL    Refill:  0  Message sent to Neurologist about the refills and plans for follow-up.  Advised GM to discontinue use of the powder in the genital area in case it is causing irritation under his foreskin. Advised on  foreskin hygiene. Provided GM with wipes and container to collect a urine specimen and bring in for assessment; cath not done due to insufficient symptoms to warrant this invasive procedure but will perform if needed (ex: fever, vomiting, change in UOP or quality, other illness). GM voiced understanding and ability to follow through.  Greater than 50% of this 25 minute encounter spent in counseling on behavior management.  Maree Erie, MD

## 2015-03-21 NOTE — Telephone Encounter (Signed)
Spoke to pt's grandmother. I advised her that Dr. Vickey Huger said that since pt's EEG was normal, there is no neurological need for keppra. However, there is still a need for risperdal but she does not recommend any changes in dosage.  I advised pt's grandmother that this message was also sent to Dr. [name]. Pt's grandmother verbalized understanding.

## 2015-03-21 NOTE — Telephone Encounter (Signed)
Since his EEG was normal , there is no neurological need for keppra.  But psychological; need for Risperdal. He neds to take that medication, yes. No change in dose.

## 2015-03-27 ENCOUNTER — Telehealth: Payer: Self-pay | Admitting: Neurology

## 2015-03-27 NOTE — Telephone Encounter (Signed)
Referral has been resent for Dr. Rosalyn Gess office. Called and spoke to patient's grandmother and explained to her to please get [facility] scheduled . Patient 's grandmother understood and she would be looking for [place]'s call.

## 2015-03-27 NOTE — Telephone Encounter (Signed)
I checked the status of the appt with Dr. Karel Jarvis. I found that pt's grandmother refused the appt. Per Dr. Rosalyn Gess office, "Pt grandmother states that [place] said that she said to hold off on things thank you for the referral".  I called pt's grandmother to ask that they please do make an appt with Dr. Karel Jarvis because that is what Dr. Vickey Huger wants. No answer, left a message asking her to call us back.

## 2015-03-27 NOTE — Telephone Encounter (Addendum)
Pt's grandmother called back , she was told to call Dr. Rosalyn Gess office to make an appt. She is confused and says that Dr. Vickey Huger had told her at one time not to worry with making an appt and now she is asking that one be made. She is requesting a call from Dr. Vickey Huger to discuss. She is going to wait until she hears from Dr. Vickey Huger before she makes an appt with Dr. Karel Jarvis. Please call and advise [phone]

## 2015-03-27 NOTE — Telephone Encounter (Signed)
Needs a 4 week update after EMU or prolonged EEG ( Dr Karel Jarvis) for medication refills. CD

## 2015-03-27 NOTE — Telephone Encounter (Signed)
I spoke to pt's grandmother. She is confused. She thought that Dr. Rosalyn Gess office was the one that was doing the VNS surgery and she was advised that he did not need the VNS surgery. I explained to her that she is correct that pt does not need the VNS surgery, but Dr. Karel Jarvis is the seizure specialist that Dr. Vickey Huger wants pt to see, and this is unrelated to the VNS surgery. Pt's grandmother apologized for the confusion. I advised her that I would have our office contact Dr. Rosalyn Gess office to get this appt scheduled. Pt's grandmother verbalized understanding.

## 2015-03-28 NOTE — Telephone Encounter (Signed)
We hold off on a VNS evaluation until we have data from prolonged EEG monitoring or EMU.

## 2015-03-28 NOTE — Telephone Encounter (Signed)
I spoke to [facility] grandmother this morning. She is indeed very confused. She saw Dr. Rosalyn Gess office had something to do with VNS surgery. She told me this morning that she doesn't know how to get in touch with Dr. Karel Jarvis now. I will asked Dr. Rosalyn Gess office to send her material and including how to get to her office -thank you, Baxter Hire , CD

## 2015-04-16 ENCOUNTER — Other Ambulatory Visit: Payer: Self-pay | Admitting: Pediatrics

## 2015-05-01 ENCOUNTER — Other Ambulatory Visit: Payer: Self-pay | Admitting: Pediatrics

## 2015-05-24 ENCOUNTER — Ambulatory Visit (INDEPENDENT_AMBULATORY_CARE_PROVIDER_SITE_OTHER): Payer: Medicare Other | Admitting: Neurology

## 2015-05-24 ENCOUNTER — Encounter: Payer: Self-pay | Admitting: Neurology

## 2015-05-24 VITALS — BP 126/78 | HR 88 | Resp 20 | Ht 69.0 in | Wt 113.0 lb

## 2015-05-24 DIAGNOSIS — F840 Autistic disorder: Secondary | ICD-10-CM

## 2015-05-24 DIAGNOSIS — R569 Unspecified convulsions: Secondary | ICD-10-CM | POA: Diagnosis not present

## 2015-05-24 DIAGNOSIS — R251 Tremor, unspecified: Secondary | ICD-10-CM

## 2015-05-24 DIAGNOSIS — IMO0001 Reserved for inherently not codable concepts without codable children: Secondary | ICD-10-CM | POA: Insufficient documentation

## 2015-05-24 DIAGNOSIS — F72 Severe intellectual disabilities: Secondary | ICD-10-CM | POA: Insufficient documentation

## 2015-05-24 DIAGNOSIS — R6889 Other general symptoms and signs: Secondary | ICD-10-CM

## 2015-05-24 NOTE — Progress Notes (Signed)
Provider:  Melvyn Novas, M D  Referring Provider: Maree Erie, MD Primary Care Physician:  Maree Erie, MD  Chief Complaint  Patient presents with  . Follow-up    no seizures, just energetic all the [name], has not seen Dr. Karel Jarvis yet, rm 10, with grandmother and caregiver  severe mental retardation, autism, severe disability.  HPI:  Jonathan [name] is a 20 y.o. male . seen here as a referral from PCP, Dr. [name] for neurological seizure evaluation,  Jonathan [name] carries a diagnosis of autism with severe cognitive and intellectual delay. He had never had seizures until July 27 when his mother witnessed a generalized seizure. With an arrival [name] at the ER he developed a second seizure which was witnessed by staff. The first seizure at home lasted for probably less than a minute as tonic-clonic convulsions but he did not lose consciousness following the seizure he appeared postictal and very drowsy. The patient had a similar event was in 2 hours later in the emergency room witnessed by a PA. The on-call neurologist decided to give Her loading dose IV. The patient was brought in for CT of the head which did not show any acute changes there was no evidence of a bleed, infection, swelling. Had not been back to baseline before the second seizure occurred so this would be considered a status epilepticus. The patient is usually noncommunicative and nonverbal due to his autism. His primary care physician is Dr. [name] I asked his mother who was attending this visit if we can obtain appropriate previous diagnostic records and test results regarding Jonathan [name]. Apparently he was tested in [place] where he was born and diagnosed with cerebral palsy, later worked up for dysphagia and chronic constipation. The patient is very slender and appears younger than his American age.  His grandmother moved him  to the [place] area but he has seen physicians at [facility] , Dr Christance(?)and  [name], GI, and seen  in [place],  [place].  Jonathan [name] had a dramatic birth history. His mother suffered from a sepsis during pregnancy and died after meningitis and she was brain dead but left on life support measures as she was pregnant Jonathan [name] was taken from the warm at gestational age the seventh month of pregnancy. His mother had been comatose since the fifth week of pregnancy. He weighted 2 pounds and 2 ounces and  fit into the hand of his father . He stayed for a long [name] in the NICU until released. At age [age] year he suffered a bout of pneumonia. He never became fully verbal he is usually nonverbal but produces sounds that he repeats continuously. He learned crawling at 9-10 months ,walking at 14 months but was always tiptoeing.   Jonathan [name] has is baseline some visual impairment but I don't know how to grade this on a test ,given his low ability to cooperate with a vision test / visual field test.  History from 01-12-15 Since I have seen Jonathan [name] about 15 months ago he has significantly changed in his ability to cognitively connect, follow guidance. He still performs repetitively the same tasks and movements but he is completely nonverbal. September he was admitted to hospital after a bout of seizures and stayed for 3 days. He had multiple tests there was no infection found no inflammation no injury. His seizures were just attributed to his autism and underlying disorder. He did develop pneumonia after seizure probably  aspiration related. In the meantime his family has noted that he is more rigid he is no longer able to climb stairs, or at least not reliably so. This did not used to be a problem last year. His right foot has slightly inverted and his right knee [name] to be permanently flexed and not extended. He also has a much higher ankle tone. He performs repetitively movements with his right hand.  His appetite has not changed , he is a "good eater" according to  mom. But he eats with long pauses, [name] to stare off, unable to handle the food- he has begun to drool which was not the case last year either. He dropped frequently . This [name] to encompass all seizure types seen in [name] syndrome. He has staring attacks, drop attacks, convulsions.  i am reviewing with mom and caretaker the imaging studies and lab work. July and September. On Keppra.  I demonstrated the recent MRI findings. Jonathan Postlex does have an abnormal corpus callosum, but I don't see any scar tissue in the subcortical areas. This cannot explain why his right foot is inverted unless he had some kind of more peripheral injury there is no evidence of stroke or abscess etc. What I would like to do is to consider Jonathan [name] for a VNS stimulator. There is also [place] specific medication that he has not been placed on thus far. The patient is maintained on Risperdal which is treating mostly behavior issues - I am unsure if this medication can continue .  While he did have 2 unexplained seizures  2015 , the seizure activity has progressed since then more frequent and Jonathan [name] to have lost some milestones.  He is less able to ambulate safely he [name] to have frequent interruptions within activities-  even while he is eating-  that could be seizures staring attacks.  He also had some tonic drop attacks and convulsive seizures. I think it is necessary to change his medication to an O start specific medication and in addition I would like for him to be established as a possible VNS candidate. MRI reviewed.  - no bleed trauma or sepsis identified. However is underlying condition makes him prone to have epileptic seizures and seizures are more common in the autistic population as well as in the cerebral palsy population. Remains for now on Keppra. I will start FYCOMPA, at 2 mg liquid form given at bedtime once a day only. After 14 days I would like a call back abut his development on the medication- Shoukd  he have still seizures I will  him to increase the dose from 2 mg to 4 mg nightly only.  We will then see if his seizure frequency is reduced or not and rely on his families report. His mother keeps a journal.     02-23-2015 [name] is seen here today for a right scheduled follow-up visit. After our last visit 6 weeks ago he was started on file comp. He did well on file comp alone and [name] not to have had more seizure activity but when he restarted or talk Risperdal this fight, he became very sedated, begun drooling, was more difficult to guide. His he was less ambulatory as well -almost a little bit" drugged". The in women's lives on an upstairs apartment and he could not manage to walk the stairs up. For this reason his primary care physician tried to reduce the Risperdal first seeing if he could tolerate the medication but this led  to him being more aggressive and easily agitated. The question is now if he can reduce affect compared to a lower level without sacrificing seizure control. We have also discussed the implantation of a VNS stimulator. Since the VNS stimulator is not a medication and free of side effect - and may be able to support seizure control even if Fycompa is only tolerated or fully effective at a lower dose.  Interval history from 05/24/2015. Jonathan [name] has undergone and prolonged EEG study which showed no epileptiform activity only eyeblink and rhythmic muscle artifact from motion. At home his behaviors have escalated and grandma states that at times Jonathan [name] to be more impatient, easier agitated to some degree more hyper and  more difficult to guide. Anecdotally, she tells me that he has a new behavior pattern for example when they eat at a restaurant he has difficulties waiting for the food to arrive which didn't used to be a problem. Now he will sometimes try to take other people's food or knock off the plates. He is constantly tapping. He is scratching behind his right ear, he is  also clapping more and more often more constantly. In other ways he [name] to be under a lot more tension and impulse. Unfortunately he has also destroyed a few items of his grandmother's that were precious memories to her. This did not used to happen in the past and this behavior [name] to be escalated since February. I am concerned that he does not have seizures as much as a behavior related problem.  Jonathan [name] will be 20 at this 1.st of June.   I am very unsure now if he has seizures, and an EMU stay would be very hard for him.   Review of Systems: Out of a complete 14 system review, the patient complains of only the following symptoms, and all other reviewed systems are negative.;  Born with developmental deficits. autism , non verbal, MRDD -  Past Medical History  Diagnosis Date  . Autism   . Seizure (HCC) 08/24/2014  . Mental retardation, idiopathic severe     History reviewed. No pertinent past surgical history.  Current Outpatient Prescriptions  Medication Sig Dispense Refill  . cetirizine (ZYRTEC) 1 MG/ML syrup TAKE 10ML'S BY MOUTH AT BEDTIME AS NEEDED FOR ALLERGY SYMPTOM CONTROL 240 mL 1  . risperiDONE (RISPERDAL) 1 MG/ML oral solution TAKE 0.5 MLS (0.5 MG TOTAL) BY MOUTH AT BEDTIME. FOR MANAGEMENT OF AGGITATION 30 mL 0  . levETIRAcetam (KEPPRA) 100 MG/ML solution Take 7.5 mLs (750 mg total) by mouth 2 (two) times daily. (Patient not taking: Reported on 05/24/2015) 473 mL 0   No current facility-administered medications for this visit.    Allergies as of 05/24/2015  . (No Known Allergies)    Vitals: BP 126/78 mmHg  Pulse 88  Resp 20  Ht 5\' 9"  (1.753 m)  Wt 113 lb (51.256 kg)  BMI 16.68 kg/m2 Last Weight:  Wt Readings from Last 1 Encounters:  05/24/15 113 lb (51.256 kg)   Last Height:   Ht Readings from Last 1 Encounters:  05/24/15 5\' 9"  (1.753 m)    Physical exam:  General: The patient is awake, alert and appears not in acute distress. The patient is well  groomed. Head: Normocephalic, atraumatic. Neck is supple. Mallampati 2, neck circumference:13  Cardiovascular:  Regular rate and rhythm , without  murmurs or carotid bruit, and without distended neck veins. Respiratory: Lungs are clear to auscultation. Skin:  Without evidence of edema, or rash  Trunk: BMI is normal posture.  Neurologic exam : The patient is awake and alert, oriented to place and [name].  Memory subjective described as intact.  There is a normal attention span & concentration ability. Speech is absent , no words are formed.non verbal repetition.   Mood and affect are aloof .  Cranial nerves: Pupils are equal and briskly reactive to light. Funduscopic exam without evidence of pallor or edema.  Extraocular movements  in vertical and horizontal planes intact and without nystagmus.  Visual fields by finger perimetry are intact. Hearing to finger rub intact.  Facial sensation appers intact to fine touch. Facial motor strength is symmetric, but he moves more on the right. The tongue and uvula move midline.  He is incessantly moving. Tongue protrusion was not provided. He has well formed muscle bulk for the muscles of mastication.Shoulder shrug is normal.   Motor exam:   Sensory:  Fine touch, pinprick and vibration ; the patient cannot assist with this exam. Coordination: Rapid alternating movements in the fingers/hands were not tested due to autism, observation - he moves his left hand much less than his right and hazardous fisted hand he also places his thumb between the index and middle finger. He has a pronation at baseline also speaks for an early childhood or interpregnancy injury and follow for cerebral palsy. The left leg muscles have less bulk than the right especially at the thigh. His left quadriceps is smaller  than his right .Finger-to-nose maneuver can not be performed. Gait and station: Patient walks without assistive device and is able unassisted to climb up to the exam  table.  Deep tendon reflexes: in the  upper and lower extremities are asymmetric , left brisk with 2 beat clonus at ankle. Babinski maneuver response is down going right and intestable left.    Assessment:  After physical and neurologic examination, review of laboratory studies, imaging, neurophysiology testing and pre-existing records, assessment of 60 minutes for this difficult patient in with  developmental delay, cerebral palsy and pervasive disorder.    MRI reviewed.  - no bleed trauma or sepsis identified. However is underlying condition makes him prone to have epileptic seizures and seizures are more common in the autistic population as well as in the cerebral palsy population. EEG reviewed. No longer a candidate for VNS unless we can prove the spells are seizures. His grandmother doesn't want him in a group home.     Plan:  Treatment plan and additional workup :  20 year old patient with birth history if severe prematurity., mother suffered during pregnancy from meningitis - went into coma and died- he was delivered in [place] while his mother was on life support - born at just above 2 pounds weight.,  and raised by [place] from [place] , [place].  No hospitalization since last visit. GThe patient is maintained on Risperdal which is treating mostly behavior issues - .  While he did have 2 presumed and unexplained seizures in  2015 , the seizure activity has progressed to more to 10  "seizures " in 2016 . I suspect spells of behavior origin. After each possible seizure Xzavian seemed to have lost some milestones.  He is less able to ambulate safely he [name] to have frequent interruptions within activities-  even while he is eating-  that could be seizures staring attacks. He also had some tonic drop attacks and convulsive seizures.  I would like for him to be established as a possible VNS candidate. We will  then see if his seizure frequency is reduced or not and rely on his families report. His  mother keeps a journal.    EMU referral to [facility].   RV in 4 month     CC: Dr [name].  Cc: Dr. Patric Dykes ,  Danella Maiers, RN at    [facility] MD 05/24/2015

## 2015-05-26 ENCOUNTER — Other Ambulatory Visit: Payer: Self-pay | Admitting: Pediatrics

## 2015-06-14 ENCOUNTER — Other Ambulatory Visit: Payer: Self-pay | Admitting: Pediatrics

## 2015-06-16 ENCOUNTER — Other Ambulatory Visit: Payer: Self-pay | Admitting: *Deleted

## 2015-06-16 DIAGNOSIS — F840 Autistic disorder: Secondary | ICD-10-CM

## 2015-06-16 MED ORDER — RISPERIDONE 1 MG/ML PO SOLN
ORAL | Status: DC
Start: 2015-06-16 — End: 2015-08-15

## 2015-06-16 NOTE — Telephone Encounter (Signed)
Completed electronically. 

## 2015-06-16 NOTE — Telephone Encounter (Signed)
VM from pt's parent requesting refill on pt's risperidone. States that she has been to the pharmacy who is not agreeable to fill.

## 2015-06-16 NOTE — Addendum Note (Signed)
Addended by: Maree ErieSTANLEY, ANGELA J on: 06/16/2015 05:22 PM   Modules accepted: Orders

## 2015-06-29 DIAGNOSIS — J189 Pneumonia, unspecified organism: Secondary | ICD-10-CM

## 2015-06-29 HISTORY — DX: Pneumonia, unspecified organism: J18.9

## 2015-07-08 ENCOUNTER — Inpatient Hospital Stay (HOSPITAL_COMMUNITY)
Admission: EM | Admit: 2015-07-08 | Discharge: 2015-07-11 | DRG: 194 | Disposition: A | Discharge status: Home / Self Care | Payer: Medicare Other | Attending: Family Medicine | Admitting: Family Medicine

## 2015-07-08 ENCOUNTER — Encounter (HOSPITAL_COMMUNITY): Payer: Self-pay

## 2015-07-08 ENCOUNTER — Emergency Department (HOSPITAL_COMMUNITY): Payer: Medicare Other

## 2015-07-08 DIAGNOSIS — F79 Unspecified intellectual disabilities: Secondary | ICD-10-CM | POA: Diagnosis not present

## 2015-07-08 DIAGNOSIS — R112 Nausea with vomiting, unspecified: Secondary | ICD-10-CM | POA: Diagnosis not present

## 2015-07-08 DIAGNOSIS — J189 Pneumonia, unspecified organism: Principal | ICD-10-CM | POA: Diagnosis present

## 2015-07-08 DIAGNOSIS — J690 Pneumonitis due to inhalation of food and vomit: Secondary | ICD-10-CM | POA: Diagnosis not present

## 2015-07-08 DIAGNOSIS — R569 Unspecified convulsions: Secondary | ICD-10-CM | POA: Diagnosis present

## 2015-07-08 DIAGNOSIS — R031 Nonspecific low blood-pressure reading: Secondary | ICD-10-CM | POA: Diagnosis not present

## 2015-07-08 DIAGNOSIS — F840 Autistic disorder: Secondary | ICD-10-CM | POA: Diagnosis present

## 2015-07-08 DIAGNOSIS — E872 Acidosis, unspecified: Secondary | ICD-10-CM

## 2015-07-08 DIAGNOSIS — R1111 Vomiting without nausea: Secondary | ICD-10-CM | POA: Diagnosis not present

## 2015-07-08 DIAGNOSIS — S0990XA Unspecified injury of head, initial encounter: Secondary | ICD-10-CM | POA: Diagnosis not present

## 2015-07-08 DIAGNOSIS — S299XXA Unspecified injury of thorax, initial encounter: Secondary | ICD-10-CM | POA: Diagnosis not present

## 2015-07-08 DIAGNOSIS — S199XXA Unspecified injury of neck, initial encounter: Secondary | ICD-10-CM | POA: Diagnosis not present

## 2015-07-08 HISTORY — DX: Allergic rhinitis, unspecified: J30.9

## 2015-07-08 HISTORY — DX: Pneumonia, unspecified organism: J18.9

## 2015-07-08 LAB — CBC WITH DIFFERENTIAL/PLATELET
BASOS ABS: 0 10*3/uL (ref 0.0–0.1)
BASOS PCT: 0 %
Eosinophils Absolute: 0 10*3/uL (ref 0.0–0.7)
Eosinophils Relative: 0 %
HEMATOCRIT: 52.2 % — AB (ref 39.0–52.0)
HEMOGLOBIN: 17.1 g/dL — AB (ref 13.0–17.0)
LYMPHS PCT: 9 %
Lymphs Abs: 0.6 10*3/uL — ABNORMAL LOW (ref 0.7–4.0)
MCH: 27.4 pg (ref 26.0–34.0)
MCHC: 32.8 g/dL (ref 30.0–36.0)
MCV: 83.8 fL (ref 78.0–100.0)
MONO ABS: 0.3 10*3/uL (ref 0.1–1.0)
Monocytes Relative: 5 %
NEUTROS ABS: 5.6 10*3/uL (ref 1.7–7.7)
NEUTROS PCT: 86 %
Platelets: 214 10*3/uL (ref 150–400)
RBC: 6.23 MIL/uL — AB (ref 4.22–5.81)
RDW: 14.3 % (ref 11.5–15.5)
WBC: 6.5 10*3/uL (ref 4.0–10.5)

## 2015-07-08 LAB — COMPREHENSIVE METABOLIC PANEL
ALBUMIN: 4.9 g/dL (ref 3.5–5.0)
ALT: 22 U/L (ref 17–63)
AST: 30 U/L (ref 15–41)
Alkaline Phosphatase: 93 U/L (ref 38–126)
Anion gap: 11 (ref 5–15)
BILIRUBIN TOTAL: 0.6 mg/dL (ref 0.3–1.2)
BUN: 9 mg/dL (ref 6–20)
CO2: 27 mmol/L (ref 22–32)
CREATININE: 0.66 mg/dL (ref 0.61–1.24)
Calcium: 10.1 mg/dL (ref 8.9–10.3)
Chloride: 105 mmol/L (ref 101–111)
GFR calc Af Amer: >60 mL/min (ref >60)
GFR calc non Af Amer: >60 mL/min (ref >60)
GLUCOSE: 64 mg/dL — AB (ref 65–99)
POTASSIUM: 4.3 mmol/L (ref 3.5–5.1)
Sodium: 143 mmol/L (ref 135–145)
TOTAL PROTEIN: 8.8 g/dL — AB (ref 6.5–8.1)

## 2015-07-08 LAB — URINE MICROSCOPIC-ADD ON
Bacteria, UA: NONE SEEN
RBC / HPF: NONE SEEN RBC/hpf (ref 0–5)

## 2015-07-08 LAB — URINALYSIS, ROUTINE W REFLEX MICROSCOPIC
BILIRUBIN URINE: NEGATIVE
GLUCOSE, UA: NEGATIVE mg/dL
Hgb urine dipstick: NEGATIVE
KETONES UR: 15 mg/dL — AB
Nitrite: NEGATIVE
PH: 7 (ref 5.0–8.0)
PROTEIN: NEGATIVE mg/dL
Specific Gravity, Urine: 1.011 (ref 1.005–1.030)

## 2015-07-08 LAB — I-STAT TROPONIN, ED: TROPONIN I, POC: 0 ng/mL (ref 0.00–0.08)

## 2015-07-08 LAB — CK: Total CK: 201 U/L (ref 49–397)

## 2015-07-08 LAB — I-STAT CG4 LACTIC ACID, ED: Lactic Acid, Venous: 4.29 mmol/L (ref 0.5–2.0)

## 2015-07-08 MED ORDER — ACETAMINOPHEN 650 MG RE SUPP
650.0000 mg | Freq: Once | RECTAL | Status: AC
  Administered 2015-07-08: 650 mg via RECTAL
  Filled 2015-07-08: qty 1

## 2015-07-08 MED ORDER — VANCOMYCIN HCL IN DEXTROSE 1-5 GM/200ML-% IV SOLN
1000.0000 mg | Freq: Once | INTRAVENOUS | Status: AC
Start: 2015-07-08 — End: 2015-07-08
  Administered 2015-07-08: 1000 mg via INTRAVENOUS
  Filled 2015-07-08: qty 200

## 2015-07-08 MED ORDER — SODIUM CHLORIDE 0.9 % IV BOLUS (SEPSIS)
2000.0000 mL | Freq: Once | INTRAVENOUS | Status: AC
  Administered 2015-07-08: 2000 mL via INTRAVENOUS

## 2015-07-08 MED ORDER — DEXTROSE 50 % IV SOLN
50.0000 mL | Freq: Once | INTRAVENOUS | Status: AC
  Administered 2015-07-08: 50 mL via INTRAVENOUS
  Filled 2015-07-08: qty 50

## 2015-07-08 MED ORDER — SODIUM CHLORIDE 0.9 % IV BOLUS (SEPSIS)
1000.0000 mL | Freq: Once | INTRAVENOUS | Status: AC
  Administered 2015-07-08: 1000 mL via INTRAVENOUS

## 2015-07-08 MED ORDER — PIPERACILLIN-TAZOBACTAM 3.375 G IVPB 30 MIN
3.3750 g | Freq: Once | INTRAVENOUS | Status: AC
  Administered 2015-07-08: 3.375 g via INTRAVENOUS
  Filled 2015-07-08: qty 50

## 2015-07-08 NOTE — H&P (Signed)
[facility]                                                                                    Patient Demographics  Jonathan [name], is a 20 y.o. male  CSN: [id number]  MRN: [id number]  DOB - 1995-11-14  Admit Date - 07/08/2015  Outpatient Primary MD for the patient is Maree Erie, MD   With History of -  Past Medical History  Diagnosis Date  . Autism   . Seizure (HCC) 08/24/2014  . Mental retardation, idiopathic severe       History reviewed. No pertinent past surgical history.  in for   Chief Complaint  Patient presents with  . Emesis     HPI  Jonathan [name]  is a 20 y.o. male, With past medical history significant for mental retardation, seizures and autism brought by his grandmother today for evaluation of altered mental status of 2 days duration. The patient had an episode of nausea and vomiting today. In the emergency room the patient was found to be febrile at 101.4 . Patient could not give any history. His chest x-ray showed some patchy infiltrates on the right. No leukocytosis or significant abnormalities noted on his blood work.    Review of Systems    Could not be obtained  Social History Social History  Substance Use Topics  . Smoking status: Never Smoker   . Smokeless tobacco: Not on file  . Alcohol Use: No     Family History Family History  Problem Relation Age of Onset  . Obesity Other   . Stroke Other   . Hypertension Other   . Arthritis Paternal Grandmother   . Seizures Maternal Uncle      Prior to Admission medications   Medication Sig Start Date End Date Taking? Authorizing Provider  cetirizine (ZYRTEC) 1 MG/ML syrup TAKE 10ML'S BY MOUTH AT BEDTIME AS NEEDED FOR ALLERGY SYMPTOM CONTROL 06/14/15  Yes Maree Erie, MD  dextromethorphan (DELSYM) 30 MG/5ML liquid Take 60 mg by mouth as needed for cough.   Yes Historical Provider, MD  risperiDONE (RISPERDAL) 1 MG/ML oral solution TAKE 0.5 MLS (0.5 MG TOTAL) BY MOUTH AT  BEDTIME. FOR MANAGEMENT OF AGGITATION 06/16/15  Yes Maree Erie, MD    No Known Allergies  Physical Exam  Vitals  Blood pressure 116/65, pulse 79, temperature 101.4 F (38.6 C), temperature source Rectal, resp. rate 17, height  (1.753 m), weight 49.442 kg (109 lb), SpO2 100 %.   1. General Young male well-nourished, in depressed mental state.  2. Flat affect ,.  3. No F.N deficits, grossly-patient moving all extremities.  4. Ears and Eyes appear Normal, Conjunctivae clear, PERRLA. Moist Oral Mucosa.  5. Supple Neck, No JVD, No cervical lymphadenopathy appriciated, No Carotid Bruits.  6. Symmetrical Chest wall movement, poor inspiratory effort.  7. RRR, No Gallops, Rubs or Murmurs, No Parasternal Heave.  8. Positive Bowel Sounds, Abdomen Soft, Non tender, No organomegaly appriciated,No rebound -guarding or rigidity.  9.  No Cyanosis, Normal Skin Turgor, No Skin Rash or Bruise.  10. Good muscle tone,  joints appear normal , no effusions, Normal ROM.  Data Review  CBC  Recent Labs Lab 07/08/15 1915  WBC 6.5  HGB 17.1*  HCT 52.2*  PLT 214  MCV 83.8  MCH 27.4  MCHC 32.8  RDW 14.3  LYMPHSABS 0.6*  MONOABS 0.3  EOSABS 0.0  BASOSABS 0.0   ------------------------------------------------------------------------------------------------------------------  Chemistries   Recent Labs Lab 07/08/15 1915  NA 143  K 4.3  CL 105  CO2 27  GLUCOSE 64*  BUN 9  CREATININE 0.66  CALCIUM 10.1  AST 30  ALT 22  ALKPHOS 93  BILITOT 0.6   ------------------------------------------------------------------------------------------------------------------ estimated creatinine clearance is 102.1 mL/min (by C-G formula based on Cr of 0.66). ------------------------------------------------------------------------------------------------------------------ No results for input(s): TSH, T4TOTAL, T3FREE, THYROIDAB in the last 72 hours.  Invalid input(s):  FREET3   Coagulation profile No results for input(s): INR, PROTIME in the last 168 hours. ------------------------------------------------------------------------------------------------------------------- No results for input(s): DDIMER in the last 72 hours. -------------------------------------------------------------------------------------------------------------------  Cardiac Enzymes No results for input(s): CKMB, TROPONINI, MYOGLOBIN in the last 168 hours.  Invalid input(s): CK ------------------------------------------------------------------------------------------------------------------ Invalid input(s): POCBNP   ---------------------------------------------------------------------------------------------------------------  Urinalysis    Component Value Date/Time   COLORURINE YELLOW 07/08/2015 2103   APPEARANCEUR CLEAR 07/08/2015 2103   LABSPEC 1.011 07/08/2015 2103   PHURINE 7.0 07/08/2015 2103   GLUCOSEU NEGATIVE 07/08/2015 2103   HGBUR NEGATIVE 07/08/2015 2103   BILIRUBINUR NEGATIVE 07/08/2015 2103   KETONESUR 15* 07/08/2015 2103   PROTEINUR NEGATIVE 07/08/2015 2103   UROBILINOGEN 0.2 10/19/2014 1027   NITRITE NEGATIVE 07/08/2015 2103   LEUKOCYTESUR SMALL* 07/08/2015 2103    ----------------------------------------------------------------------------------------------------------------  Imaging results:   Dg Chest 2 View  07/08/2015  CLINICAL DATA:  Fall, altered mental status, vomiting for 3 days EXAM: CHEST  2 VIEW COMPARISON:  10/18/2014 FINDINGS: Cardiomediastinal silhouette is stable. There is patchy infiltrate in right lower lobe suspicious for aspiration pneumonia. No pulmonary edema. Left lung is clear. IMPRESSION: Patchy infiltrate in right lower lobe suspicious for aspiration pneumonia. No pulmonary edema. Electronically Signed   By: Natasha Mead M.D.   On: 07/08/2015 20:02   Ct Head Wo Contrast  07/08/2015  CLINICAL DATA:  20 year old male with fall  EXAM: CT HEAD WITHOUT CONTRAST CT CERVICAL SPINE WITHOUT CONTRAST TECHNIQUE: Multidetector CT imaging of the head and cervical spine was performed following the standard protocol without intravenous contrast. Multiplanar CT image reconstructions of the cervical spine were also generated. COMPARISON:  Brain MRI dated 08/24/2014 and head CT dated 08/24/2014 FINDINGS: CT HEAD FINDINGS The ventricles are appropriate size for patient's age. There is a cavum septum pellucidum and cavum vergae measuring approximately 2 cm in diameter anteriorly. There is no acute intracranial hemorrhage. No midline shift. The visualized paranasal sinuses and mastoid air cells are clear. The calvarium is intact. 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 The intervertebral disc spaces are preserved.The odontoid and spinous processes are intact.There is normal anatomic alignment of the C1-C2 lateral masses. The visualized soft tissues appear unremarkable. IMPRESSION: No acute intracranial pathology. Cavum septum pellucidum and cavum vergae similar in size compared to prior study. No acute/traumatic cervical spine pathology. Electronically Signed   By: Elgie Collard M.D.   On: 07/08/2015 20:29   Ct Cervical Spine Wo Contrast  07/08/2015  CLINICAL DATA:  20 year old male with fall EXAM: CT HEAD WITHOUT CONTRAST CT CERVICAL SPINE WITHOUT CONTRAST TECHNIQUE: Multidetector CT imaging of the head and cervical spine was  performed following the standard protocol without intravenous contrast. Multiplanar CT image reconstructions of the cervical spine were also generated. COMPARISON:  Brain MRI dated 08/24/2014 and head CT dated 08/24/2014 FINDINGS: CT HEAD FINDINGS The ventricles are appropriate size for patient's age. There is a cavum septum pellucidum and cavum vergae measuring approximately 2 cm in  diameter anteriorly. There is no acute intracranial hemorrhage. No midline shift. The visualized paranasal sinuses and mastoid air cells are clear. The calvarium is intact. 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 The intervertebral disc spaces are preserved.The odontoid and spinous processes are intact.There is normal anatomic alignment of the C1-C2 lateral masses. The visualized soft tissues appear unremarkable. IMPRESSION: No acute intracranial pathology. Cavum septum pellucidum and cavum vergae similar in size compared to prior study. No acute/traumatic cervical spine pathology. Electronically Signed   By: Elgie CollardArash  Radparvar M.D.   On: 07/08/2015 20:29    My personal review of EKG: Rhythm NSR, at 67 bpm  Assessment & Plan   1. Pneumonia / CAP 2. AMS 3. Nausea and vomiting  Plan  IV Rocephin and Zithromax IV fluids Check bacterial antigens and antibodies     DVT Prophylaxis Lovenox  AM Labs Ordered, also please review Full Orders  Family Communication: Admission, patients condition and plan of care including tests being ordered have been discussed with the grandmother who indicates understanding and agrees with the plan and Code Status.  Code Status full  Disposition Plan: Home  Time spent in minutes : 35 minutes  Condition GUARDED   @SIGNATURE @

## 2015-07-08 NOTE — ED Notes (Signed)
Sandwich and beverage given to pt's cargiver

## 2015-07-08 NOTE — ED Notes (Signed)
Pt presents to the ed with ems with grandmother, the patient is autistic and non-verbal, per grandmother he has been lethargic since Thursday and started throwing up today, the patient is usually energetic and today she went to wake him up and noticed he had thrown up and he was difficult to arouse, patient presents to the ed lethargic and not following any commands

## 2015-07-08 NOTE — ED Provider Notes (Signed)
CSN: [id number]     Arrival date & time 07/08/15  1645 History   First MD Initiated Contact with Patient 07/08/15 1739     Chief Complaint  Patient presents with  . Emesis     (Consider location/radiation/quality/duration/timing/severity/associated sxs/prior Treatment) HPI Patient with history of severe autism/retardation. Normally is nonverbal and does not perform activities of daily living. Presents to the emergency department with increased drowsiness. This been going on for the last 3 days. Patient slept from yesterday evening to 2 PM this afternoon. When grandmother outpatient he was lying in vomit. More difficult to arouse. States he had several episodes of vomiting. No diarrhea. Patient is on no medications at home. Patient is unable to answer questions. Level V caveat applies. Past Medical History  Diagnosis Date  . Autism   . Seizure (HCC) 08/24/2014  . Mental retardation, idiopathic severe    History reviewed. No pertinent past surgical history. Family History  Problem Relation Age of Onset  . Obesity Other   . Stroke Other   . Hypertension Other   . Arthritis Paternal Grandmother   . Seizures Maternal Uncle    Social History  Substance Use Topics  . Smoking status: Never Smoker   . Smokeless tobacco: None  . Alcohol Use: No    Review of Systems  Unable to perform ROS: Mental status change      Allergies  Review of patient's allergies indicates no known allergies.  Home Medications   Prior to Admission medications   Medication Sig Start Date End Date Taking? Authorizing Provider  cetirizine (ZYRTEC) 1 MG/ML syrup TAKE 10ML'S BY MOUTH AT BEDTIME AS NEEDED FOR ALLERGY SYMPTOM CONTROL 06/14/15  Yes Maree Erie, MD  dextromethorphan (DELSYM) 30 MG/5ML liquid Take 60 mg by mouth as needed for cough.   Yes Historical Provider, MD  risperiDONE (RISPERDAL) 1 MG/ML oral solution TAKE 0.5 MLS (0.5 MG TOTAL) BY MOUTH AT BEDTIME. FOR MANAGEMENT OF AGGITATION 06/16/15   Yes Maree Erie, MD   BP 103/52 mmHg  Pulse 83  Temp(Src) 99.2 F (37.3 C) (Oral)  Resp 16  Ht 5' 10.8" (1.798 m)  Wt 121 lb 1.6 oz (54.931 kg)  BMI 16.99 kg/m2  SpO2 94% Physical Exam  Constitutional: He appears well-developed and well-nourished. No distress.  Sleeping  HENT:  Head: Normocephalic and atraumatic.  Mouth/Throat: Oropharynx is clear and moist.  Right eye lateral gaze deviation. Per family this is normal.  Eyes: EOM are normal. Pupils are equal, round, and reactive to light.  Neck: Normal range of motion. Neck supple. No JVD present.  Cardiovascular: Normal rate and regular rhythm.  Exam reveals no gallop and no friction rub.   No murmur heard. Pulmonary/Chest: Effort normal and breath sounds normal. No respiratory distress. He has no wheezes. He has no rales. He exhibits no tenderness.  Abdominal: Soft. Bowel sounds are normal. He exhibits no distension and no mass. There is no tenderness. There is no rebound and no guarding.  Musculoskeletal: Normal range of motion. He exhibits no edema or tenderness.  Neurological:  Patient will open eyes spontaneously. Appears to be moving all extremities. Not following commands. Nonverbal.  Skin: Skin is warm and dry. No rash noted. No erythema.  Nursing note and vitals reviewed.   ED Course  Procedures (including critical care time) Labs Review Labs Reviewed  CBC WITH DIFFERENTIAL/PLATELET - Abnormal; Notable for the following:    RBC 6.23 (*)    Hemoglobin 17.1 (*)    HCT  52.2 (*)    Lymphs Abs 0.6 (*)    All other components within normal limits  COMPREHENSIVE METABOLIC PANEL - Abnormal; Notable for the following:    Glucose, Bld 64 (*)    Total Protein 8.8 (*)    All other components within normal limits  URINALYSIS, ROUTINE W REFLEX MICROSCOPIC (NOT AT [facility]) - Abnormal; Notable for the following:    Ketones, ur 15 (*)    Leukocytes, UA SMALL (*)    All other components within normal limits  URINE  MICROSCOPIC-ADD ON - Abnormal; Notable for the following:    Squamous Epithelial / LPF 0-5 (*)    All other components within normal limits  BASIC METABOLIC PANEL - Abnormal; Notable for the following:    Glucose, Bld 108 (*)    Calcium 8.6 (*)    All other components within normal limits  CBC - Abnormal; Notable for the following:    WBC 12.0 (*)    Hemoglobin 12.5 (*)    HCT 38.8 (*)    All other components within normal limits  I-STAT CG4 LACTIC ACID, ED - Abnormal; Notable for the following:    Lactic Acid, Venous 4.29 (*)    All other components within normal limits  CULTURE, BLOOD (ROUTINE X 2)  CULTURE, BLOOD (ROUTINE X 2)  CULTURE, EXPECTORATED SPUTUM-ASSESSMENT  GRAM STAIN  CK  STREP PNEUMONIAE URINARY ANTIGEN  I-STAT TROPOININ, ED    Imaging Review Dg Chest 2 View  07/08/2015  CLINICAL DATA:  Fall, altered mental status, vomiting for 3 days EXAM: CHEST  2 VIEW COMPARISON:  10/18/2014 FINDINGS: Cardiomediastinal silhouette is stable. There is patchy infiltrate in right lower lobe suspicious for aspiration pneumonia. No pulmonary edema. Left lung is clear. IMPRESSION: Patchy infiltrate in right lower lobe suspicious for aspiration pneumonia. No pulmonary edema. Electronically Signed   By: Natasha MeadLiviu  Pop M.D.   On: 07/08/2015 20:02   Ct Head Wo Contrast  07/08/2015  CLINICAL DATA:  20 year old male with fall EXAM: CT HEAD WITHOUT CONTRAST CT CERVICAL SPINE WITHOUT CONTRAST TECHNIQUE: Multidetector CT imaging of the head and cervical spine was performed following the standard protocol without intravenous contrast. Multiplanar CT image reconstructions of the cervical spine were also generated. COMPARISON:  Brain MRI dated 08/24/2014 and head CT dated 08/24/2014 FINDINGS: CT HEAD FINDINGS The ventricles are appropriate size for patient's age. There is a cavum septum pellucidum and cavum vergae measuring approximately 2 cm in diameter anteriorly. There is no acute intracranial hemorrhage.  No midline shift. The visualized paranasal sinuses and mastoid air cells are clear. The calvarium is intact. 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 The intervertebral disc spaces are preserved.The odontoid and spinous processes are intact.There is normal anatomic alignment of the C1-C2 lateral masses. The visualized soft tissues appear unremarkable. IMPRESSION: No acute intracranial pathology. Cavum septum pellucidum and cavum vergae similar in size compared to prior study. No acute/traumatic cervical spine pathology. Electronically Signed   By: Elgie CollardArash  Radparvar M.D.   On: 07/08/2015 20:29   Ct Cervical Spine Wo Contrast  07/08/2015  CLINICAL DATA:  20 year old male with fall EXAM: CT HEAD WITHOUT CONTRAST CT CERVICAL SPINE WITHOUT CONTRAST TECHNIQUE: Multidetector CT imaging of the head and cervical spine was performed following the standard protocol without intravenous contrast. Multiplanar CT image reconstructions of the cervical spine were also generated. COMPARISON:  Brain MRI  dated 08/24/2014 and head CT dated 08/24/2014 FINDINGS: CT HEAD FINDINGS The ventricles are appropriate size for patient's age. There is a cavum septum pellucidum and cavum vergae measuring approximately 2 cm in diameter anteriorly. There is no acute intracranial hemorrhage. No midline shift. The visualized paranasal sinuses and mastoid air cells are clear. The calvarium is intact. 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 The intervertebral disc spaces are preserved.The odontoid and spinous processes are intact.There is normal anatomic alignment of the C1-C2 lateral masses. The visualized soft tissues appear unremarkable. IMPRESSION: No acute intracranial  pathology. Cavum septum pellucidum and cavum vergae similar in size compared to prior study. No acute/traumatic cervical spine pathology. Electronically Signed   By: Elgie Collard M.D.   On: 07/08/2015 20:29   I have personally reviewed and evaluated these images and lab results as part of my medical decision-making.   EKG Interpretation   Date/Time:  Saturday July 08 2015 16:59:21 EDT Ventricular Rate:  67 PR Interval:  123 QRS Duration: 87 QT Interval:  360 QTC Calculation: 380 R Axis:   78 Text Interpretation:  Sinus rhythm Borderline T abnormalities, inferior  leads Borderline ST elevation, anterior leads,  Confirmed by Siomara Burkel   MD, Charmel Pronovost ([id number]) on 07/08/2015 5:03:30 PM      MDM   Final diagnoses:  None   Aggressive fluid rehydration and broad spectrum abx intiated in ED. Pt with improved mentation back to baseline per caretaker. Discussed with [facility] and will admit     Loren Racer, MD 07/09/15 1308

## 2015-07-08 NOTE — ED Notes (Signed)
Tried calling report. Nurse busy on [address]

## 2015-07-09 DIAGNOSIS — J189 Pneumonia, unspecified organism: Principal | ICD-10-CM

## 2015-07-09 LAB — BASIC METABOLIC PANEL
ANION GAP: 5 (ref 5–15)
BUN: 6 mg/dL (ref 6–20)
CALCIUM: 8.6 mg/dL — AB (ref 8.9–10.3)
CO2: 28 mmol/L (ref 22–32)
CREATININE: 0.66 mg/dL (ref 0.61–1.24)
Chloride: 106 mmol/L (ref 101–111)
GFR calc non Af Amer: >60 mL/min (ref >60)
Glucose, Bld: 108 mg/dL — ABNORMAL HIGH (ref 65–99)
Potassium: 3.6 mmol/L (ref 3.5–5.1)
SODIUM: 139 mmol/L (ref 135–145)

## 2015-07-09 LAB — CBC
HCT: 38.8 % — ABNORMAL LOW (ref 39.0–52.0)
HEMOGLOBIN: 12.5 g/dL — AB (ref 13.0–17.0)
MCH: 27.6 pg (ref 26.0–34.0)
MCHC: 33 g/dL (ref 30.0–36.0)
MCV: 83.6 fL (ref 78.0–100.0)
PLATELETS: 188 10*3/uL (ref 150–400)
RBC: 4.64 MIL/uL (ref 4.22–5.81)
RDW: 14.3 % (ref 11.5–15.5)
WBC: 12 10*3/uL — AB (ref 4.0–10.5)

## 2015-07-09 LAB — STREP PNEUMONIAE URINARY ANTIGEN: Strep Pneumo Urinary Antigen: NEGATIVE

## 2015-07-09 MED ORDER — ONDANSETRON HCL 4 MG/2ML IJ SOLN: 4.0000 mg | Freq: Four times a day (QID) | INTRAMUSCULAR | Status: DC | PRN

## 2015-07-09 MED ORDER — RISPERIDONE 0.5 MG PO TABS
0.5000 mg | ORAL_TABLET | Freq: Every day | ORAL | Status: DC
  Administered 2015-07-09: 0.5 mg via ORAL
  Filled 2015-07-09 (×2): qty 1

## 2015-07-09 MED ORDER — ACETAMINOPHEN 650 MG RE SUPP
650.0000 mg | Freq: Four times a day (QID) | RECTAL | Status: DC | PRN
Start: 2015-07-09 — End: 2015-07-11

## 2015-07-09 MED ORDER — ENOXAPARIN SODIUM 40 MG/0.4ML ~~LOC~~ SOLN
40.0000 mg | Freq: Every day | SUBCUTANEOUS | Status: DC
  Administered 2015-07-09 – 2015-07-11 (×3): 40 mg via SUBCUTANEOUS
  Filled 2015-07-09 (×4): qty 0.4

## 2015-07-09 MED ORDER — ACETAMINOPHEN 325 MG PO TABS: 650.0000 mg | ORAL_TABLET | Freq: Four times a day (QID) | ORAL | Status: DC | PRN

## 2015-07-09 MED ORDER — DEXTROSE-NACL 5-0.45 % IV SOLN
INTRAVENOUS | Status: DC
  Administered 2015-07-09 – 2015-07-11 (×3): via INTRAVENOUS

## 2015-07-09 MED ORDER — RISPERIDONE 1 MG/ML PO SOLN
0.5000 mg | Freq: Every day | ORAL | Status: DC
  Administered 2015-07-09 – 2015-07-10 (×2): 0.5 mg via ORAL
  Filled 2015-07-09 (×2): qty 0.5

## 2015-07-09 MED ORDER — ONDANSETRON HCL 4 MG PO TABS: 4.0000 mg | ORAL_TABLET | Freq: Four times a day (QID) | ORAL | Status: DC | PRN

## 2015-07-09 MED ORDER — DEXTROSE 5 % IV SOLN
500.0000 mg | Freq: Every day | INTRAVENOUS | Status: DC
  Administered 2015-07-09 (×2): 500 mg via INTRAVENOUS
  Filled 2015-07-09 (×2): qty 500

## 2015-07-09 MED ORDER — DEXTROSE 5 % IV SOLN
1.0000 g | Freq: Every day | INTRAVENOUS | Status: DC
  Administered 2015-07-09 – 2015-07-10 (×2): 1 g via INTRAVENOUS
  Filled 2015-07-09 (×2): qty 10

## 2015-07-09 MED ORDER — ALBUTEROL SULFATE (2.5 MG/3ML) 0.083% IN NEBU: 2.5000 mg | INHALATION_SOLUTION | RESPIRATORY_TRACT | Status: DC | PRN

## 2015-07-09 NOTE — Progress Notes (Signed)
PROGRESS NOTE    Jonathan [name]  [id number] DOB: April 26, 1995 DOA: 07/08/2015 PCP: Maree Erie, MD    Brief Narrative:   20 y.o. male, With past medical history significant for mental retardation, seizures and autism brought by his grandmother today for evaluation of altered mental status of 2 days duration. The patient had an episode of nausea and vomiting today  Assessment & Plan:   Active Problems:   Pneumonia - stable continue current medical regimen. - reassess next am.  DVT prophylaxis: Lovenox Code Status: full Family Communication: d/c mom Disposition Plan: pending improvement in condition   Consultants:   None   Procedures: None   Antimicrobials:  Rocephin and Azithromycin   Subjective: Pt has no new complaints. No acute issues overnight.  Objective: Filed Vitals:   07/08/15 2230 07/09/15 0003 07/09/15 0536 07/09/15 1419  BP: 116/65 107/69 103/52 113/60  Pulse: 79 80 83 83  Temp:  98.9 F (37.2 C) 99.2 F (37.3 C) 98.8 F (37.1 C)  TempSrc:  Oral Oral   Resp: 17 16 16 18   Height:  5' 10.8" (1.798 m)    Weight:  54.931 kg (121 lb 1.6 oz)    SpO2: 100% 97% 94% 98%    Intake/Output Summary (Last 24 hours) at 07/09/15 1554 Last data filed at 07/09/15 1100  Gross per 24 hour  Intake   1990 ml  Output      0 ml  Net   1990 ml   Filed Weights   07/08/15 1710 07/09/15 0003  Weight: 49.442 kg (109 lb) 54.931 kg (121 lb 1.6 oz)    Examination:  General exam: resting Comfortably Respiratory system: No increased work of breathing, equal chest rise, no wheezes Cardiovascular system: S1 & S2 heard, RRR. No JVD, murmurs, rubs, gallops or clicks. No pedal edema. Gastrointestinal system: Abdomen is nondistended, soft and nontender. No organomegaly or masses felt. Normal bowel sounds heard. Central nervous system:No focal neurological deficits. Extremities: Equal tone Skin: No rashes, lesions or ulcers Psychiatry: Unable to accurately assess as  patient was resting comfortably  Data Reviewed: I have personally reviewed following labs and imaging studies  CBC:  Recent Labs Lab 07/08/15 1915 07/09/15 0521  WBC 6.5 12.0*  NEUTROABS 5.6  --   HGB 17.1* 12.5*  HCT 52.2* 38.8*  MCV 83.8 83.6  PLT 214 188   Basic Metabolic Panel:  Recent Labs Lab 07/08/15 1915 07/09/15 0521  NA 143 139  K 4.3 3.6  CL 105 106  CO2 27 28  GLUCOSE 64* 108*  BUN 9 6  CREATININE 0.66 0.66  CALCIUM 10.1 8.6*   GFR: Estimated Creatinine Clearance: 113.4 mL/min (by C-G formula based on Cr of 0.66). Liver Function Tests:  Recent Labs Lab 07/08/15 1915  AST 30  ALT 22  ALKPHOS 93  BILITOT 0.6  PROT 8.8*  ALBUMIN 4.9   No results for input(s): LIPASE, AMYLASE in the last 168 hours. No results for input(s): AMMONIA in the last 168 hours. Coagulation Profile: No results for input(s): INR, PROTIME in the last 168 hours. Cardiac Enzymes:  Recent Labs Lab 07/08/15 2052  CKTOTAL 201   BNP (last 3 results) No results for input(s): PROBNP in the last 8760 hours. HbA1C: No results for input(s): HGBA1C in the last 72 hours. CBG: No results for input(s): GLUCAP in the last 168 hours. Lipid Profile: No results for input(s): CHOL, HDL, LDLCALC, TRIG, CHOLHDL, LDLDIRECT in the last 72 hours. Thyroid Function Tests: No results for  input(s): TSH, T4TOTAL, FREET4, T3FREE, THYROIDAB in the last 72 hours. Anemia Panel: No results for input(s): VITAMINB12, FOLATE, FERRITIN, TIBC, IRON, RETICCTPCT in the last 72 hours. Sepsis Labs:  Recent Labs Lab 07/08/15 1923  LATICACIDVEN 4.29*    No results found for this or any previous visit (from the past 240 hour(s)).       Radiology Studies: Dg Chest 2 View  07/08/2015  CLINICAL DATA:  Fall, altered mental status, vomiting for 3 days EXAM: CHEST  2 VIEW COMPARISON:  10/18/2014 FINDINGS: Cardiomediastinal silhouette is stable. There is patchy infiltrate in right lower lobe suspicious  for aspiration pneumonia. No pulmonary edema. Left lung is clear. IMPRESSION: Patchy infiltrate in right lower lobe suspicious for aspiration pneumonia. No pulmonary edema. Electronically Signed   By: Natasha MeadLiviu  Pop M.D.   On: 07/08/2015 20:02   Ct Head Wo Contrast  07/08/2015  CLINICAL DATA:  20 year old male with fall EXAM: CT HEAD WITHOUT CONTRAST CT CERVICAL SPINE WITHOUT CONTRAST TECHNIQUE: Multidetector CT imaging of the head and cervical spine was performed following the standard protocol without intravenous contrast. Multiplanar CT image reconstructions of the cervical spine were also generated. COMPARISON:  Brain MRI dated 08/24/2014 and head CT dated 08/24/2014 FINDINGS: CT HEAD FINDINGS The ventricles are appropriate size for patient's age. There is a cavum septum pellucidum and cavum vergae measuring approximately 2 cm in diameter anteriorly. There is no acute intracranial hemorrhage. No midline shift. The visualized paranasal sinuses and mastoid air cells are clear. The calvarium is intact. 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 The intervertebral disc spaces are preserved.The odontoid and spinous processes are intact.There is normal anatomic alignment of the C1-C2 lateral masses. The visualized soft tissues appear unremarkable. IMPRESSION: No acute intracranial pathology. Cavum septum pellucidum and cavum vergae similar in size compared to prior study. No acute/traumatic cervical spine pathology. Electronically Signed   By: Elgie CollardArash  Radparvar M.D.   On: 07/08/2015 20:29   Ct Cervical Spine Wo Contrast  07/08/2015  CLINICAL DATA:  20 year old male with fall EXAM: CT HEAD WITHOUT CONTRAST CT CERVICAL SPINE WITHOUT CONTRAST TECHNIQUE: Multidetector CT imaging of the head and cervical spine was performed following the standard protocol without intravenous contrast.  Multiplanar CT image reconstructions of the cervical spine were also generated. COMPARISON:  Brain MRI dated 08/24/2014 and head CT dated 08/24/2014 FINDINGS: CT HEAD FINDINGS The ventricles are appropriate size for patient's age. There is a cavum septum pellucidum and cavum vergae measuring approximately 2 cm in diameter anteriorly. There is no acute intracranial hemorrhage. No midline shift. The visualized paranasal sinuses and mastoid air cells are clear. The calvarium is intact. 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 The intervertebral disc spaces are preserved.The odontoid and spinous processes are intact.There is normal anatomic alignment of the C1-C2 lateral masses. The visualized soft tissues appear unremarkable. IMPRESSION: No acute intracranial pathology. Cavum septum pellucidum and cavum vergae similar in size compared to prior study. No acute/traumatic cervical spine pathology. Electronically Signed   By: Elgie CollardArash  Radparvar M.D.   On: 07/08/2015 20:29        Scheduled Meds: . azithromycin  500 mg Intravenous QHS  . cefTRIAXone (ROCEPHIN)  IV  1 g Intravenous Daily  . enoxaparin (LOVENOX) injection  40 mg Subcutaneous Daily  . risperiDONE  0.5 mg Oral QHS   Continuous Infusions: . dextrose 5 % and 0.45% NaCl 75 mL/hr at 07/09/15 0148     LOS: 1 day    Time spent:    Penny Pia, MD [facility] Pager [phone]  If 7PM-7AM, please contact night-coverage [email or web address] Password [facility] 07/09/2015, 3:54 PM

## 2015-07-10 ENCOUNTER — Ambulatory Visit: Payer: Medicare Other | Admitting: Pediatrics

## 2015-07-10 MED ORDER — AZITHROMYCIN 200 MG/5ML PO SUSR
500.0000 mg | Freq: Every day | ORAL | Status: DC
  Administered 2015-07-10: 500 mg via ORAL
  Filled 2015-07-10: qty 15

## 2015-07-10 MED ORDER — CEFDINIR 125 MG/5ML PO SUSR
300.0000 mg | Freq: Two times a day (BID) | ORAL | Status: DC
  Administered 2015-07-11: 300 mg via ORAL
  Filled 2015-07-10: qty 15

## 2015-07-10 NOTE — Progress Notes (Signed)
Pharmacy: Antibiotic Transition from IV to po  OBJECTIVE:  SCr 0.66, estimated CrCl~100 ml/min.   6/10 BCx >> 6/10 S pneumo urinary antigen >> neg  ASSESSMENT:  [age] YOM with MR, seizures, and autism [facility] due to AMS for 2 days. CXR at the time was concerning for PNA and the patient was started on Rocephin + Azithromycin for r/o CAP.  Pharmacy has now been consulted to transition the patient from IV antibiotics to po antibiotics that come in a suspension for completion of the intended 7d LOT course for CAP.   The patient has already received Rocephin this AM so will not need to start the cephalosporin antibiotic until 6/13.   PLAN:  1. D/c Rocephin IV and Azithromycin IV 2. Start Azithromycin suspension 500 mg po daily x 6 doses 3. Start Cefdinir suspension 300 mg po twice daily x 10 doses (starting on 6/13 AM) 4. Pharmacy will sign off as no further interventions expected at this time.  Georgina PillionElizabeth Tal Kempker, PharmD, BCPS Clinical Pharmacist Pager: [phone] 07/10/2015 11:31 AM

## 2015-07-10 NOTE — Care Management Note (Signed)
Case Management Note  Patient Details  Name: Jonathan [name] MRN: [id number] Date of Birth: 01/06/1996  Subjective/Objective:                 Patient admitted with asp pna. Patient w/ history MR/ Autism/ seizure. Mother died at birth. Patient has been cared for by grandmother and anticipate DC to home w/ grandmother.    Action/Plan:  CM will continue to follow for DC planning needs.   Expected Discharge Date:                  Expected Discharge Plan:  Home w Home Health Services  In-House Referral:     Discharge planning Services  CM Consult  Post Acute Care Choice:    Choice offered to:     DME Arranged:    DME Agency:     HH Arranged:    HH Agency:     Status of Service:  In process, will continue to follow  Medicare Important Message Given:    Date Medicare IM Given:    Medicare IM give by:    Date Additional Medicare IM Given:    Additional Medicare Important Message give by:     If discussed at Long Length of Stay Meetings, dates discussed:    Additional Comments:  Lawerance SabalDebbie Kadelyn Dimascio, RN 07/10/2015, 3:54 PM

## 2015-07-10 NOTE — Progress Notes (Signed)
PROGRESS NOTE    Jonathan [name]  ZOX:[id number] DOB: 01-18-1996 DOA: 07/08/2015 PCP: Maree Erie, MD    Brief Narrative:   20 y.o. male, With past medical history significant for mental retardation, seizures and autism brought by his grandmother today for evaluation of altered mental status of 2 days duration. The patient had an episode of nausea and vomiting today  Assessment & Plan:   Active Problems:   Pneumonia - transition to oral regimen (liquid form as patient does not swallow pills) monitor and see if patient tolerates medication without any side effects.   DVT prophylaxis: Lovenox Code Status: full Family Communication: d/c mom Disposition Plan: possible d/c next am if continued improvement.   Consultants:   None   Procedures: None   Antimicrobials:  Rocephin and Azithromycin   Subjective: Pt has no new complaints. No acute issues overnight.  Objective: Filed Vitals:   07/09/15 1419 07/09/15 2118 07/10/15 0559 07/10/15 1357  BP: 113/60 134/80 128/76 119/79  Pulse: 83 74 71 72  Temp: 98.8 F (37.1 C) 98.9 F (37.2 C) 98.4 F (36.9 C) 97.7 F (36.5 C)  TempSrc:  Oral Oral Axillary  Resp: 18 16 15 16   Height:      Weight:      SpO2: 98% 100% 100% 98%    Intake/Output Summary (Last 24 hours) at 07/10/15 1526 Last data filed at 07/10/15 0631  Gross per 24 hour  Intake    420 ml  Output   2750 ml  Net  -2330 ml   Filed Weights   07/08/15 1710 07/09/15 0003  Weight: 49.442 kg (109 lb) 54.931 kg (121 lb 1.6 oz)    Examination:  General exam: resting Comfortably Respiratory system: No increased work of breathing, equal chest rise, no wheezes Cardiovascular system: S1 & S2 heard, RRR. No JVD, murmurs, rubs, gallops or clicks. No pedal edema. Gastrointestinal system: Abdomen is nondistended, soft and nontender. No organomegaly or masses felt. Normal bowel sounds heard. Central nervous system: No focal neurological deficits. Extremities: Equal  tone Skin: No rashes, lesions or ulcers Psychiatry: Unable to accurately assess given MR  Data Reviewed: I have personally reviewed following labs and imaging studies  CBC:  Recent Labs Lab 07/08/15 1915 07/09/15 0521  WBC 6.5 12.0*  NEUTROABS 5.6  --   HGB 17.1* 12.5*  HCT 52.2* 38.8*  MCV 83.8 83.6  PLT 214 188   Basic Metabolic Panel:  Recent Labs Lab 07/08/15 1915 07/09/15 0521  NA 143 139  K 4.3 3.6  CL 105 106  CO2 27 28  GLUCOSE 64* 108*  BUN 9 6  CREATININE 0.66 0.66  CALCIUM 10.1 8.6*   GFR: Estimated Creatinine Clearance: 113.4 mL/min (by C-G formula based on Cr of 0.66). Liver Function Tests:  Recent Labs Lab 07/08/15 1915  AST 30  ALT 22  ALKPHOS 93  BILITOT 0.6  PROT 8.8*  ALBUMIN 4.9   No results for input(s): LIPASE, AMYLASE in the last 168 hours. No results for input(s): AMMONIA in the last 168 hours. Coagulation Profile: No results for input(s): INR, PROTIME in the last 168 hours. Cardiac Enzymes:  Recent Labs Lab 07/08/15 2052  CKTOTAL 201   BNP (last 3 results) No results for input(s): PROBNP in the last 8760 hours. HbA1C: No results for input(s): HGBA1C in the last 72 hours. CBG: No results for input(s): GLUCAP in the last 168 hours. Lipid Profile: No results for input(s): CHOL, HDL, LDLCALC, TRIG, CHOLHDL, LDLDIRECT in the last  72 hours. Thyroid Function Tests: No results for input(s): TSH, T4TOTAL, FREET4, T3FREE, THYROIDAB in the last 72 hours. Anemia Panel: No results for input(s): VITAMINB12, FOLATE, FERRITIN, TIBC, IRON, RETICCTPCT in the last 72 hours. Sepsis Labs:  Recent Labs Lab 07/08/15 1923  LATICACIDVEN 4.29*    Recent Results (from the past 240 hour(s))  Culture, blood (Routine X 2) w Reflex to ID Panel     Status: None (Preliminary result)   Collection Time: 07/08/15  8:50 PM  Result Value Ref Range Status   Specimen Description BLOOD LEFT WRIST  Final   Special Requests BOTTLES DRAWN AEROBIC AND  ANAEROBIC 5CC  Final   Culture NO GROWTH 1 DAY  Final   Report Status PENDING  Incomplete  Culture, blood (Routine X 2) w Reflex to ID Panel     Status: None (Preliminary result)   Collection Time: 07/08/15  8:55 PM  Result Value Ref Range Status   Specimen Description BLOOD RIGHT HAND  Final   Special Requests BOTTLES DRAWN AEROBIC AND ANAEROBIC 5CC   Final   Culture NO GROWTH 1 DAY  Final   Report Status PENDING  Incomplete         Radiology Studies: Dg Chest 2 View  07/08/2015  CLINICAL DATA:  Fall, altered mental status, vomiting for 3 days EXAM: CHEST  2 VIEW COMPARISON:  10/18/2014 FINDINGS: Cardiomediastinal silhouette is stable. There is patchy infiltrate in right lower lobe suspicious for aspiration pneumonia. No pulmonary edema. Left lung is clear. IMPRESSION: Patchy infiltrate in right lower lobe suspicious for aspiration pneumonia. No pulmonary edema. Electronically Signed   By: Natasha Mead M.D.   On: 07/08/2015 20:02   Ct Head Wo Contrast  07/08/2015  CLINICAL DATA:  20 year old male with fall EXAM: CT HEAD WITHOUT CONTRAST CT CERVICAL SPINE WITHOUT CONTRAST TECHNIQUE: Multidetector CT imaging of the head and cervical spine was performed following the standard protocol without intravenous contrast. Multiplanar CT image reconstructions of the cervical spine were also generated. COMPARISON:  Brain MRI dated 08/24/2014 and head CT dated 08/24/2014 FINDINGS: CT HEAD FINDINGS The ventricles are appropriate size for patient's age. There is a cavum septum pellucidum and cavum vergae measuring approximately 2 cm in diameter anteriorly. There is no acute intracranial hemorrhage. No midline shift. The visualized paranasal sinuses and mastoid air cells are clear. The calvarium is intact. 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 The intervertebral disc  spaces are preserved.The odontoid and spinous processes are intact.There is normal anatomic alignment of the C1-C2 lateral masses. The visualized soft tissues appear unremarkable. IMPRESSION: No acute intracranial pathology. Cavum septum pellucidum and cavum vergae similar in size compared to prior study. No acute/traumatic cervical spine pathology. Electronically Signed   By: Elgie Collard M.D.   On: 07/08/2015 20:29   Ct Cervical Spine Wo Contrast  07/08/2015  CLINICAL DATA:  20 year old male with fall EXAM: CT HEAD WITHOUT CONTRAST CT CERVICAL SPINE WITHOUT CONTRAST TECHNIQUE: Multidetector CT imaging of the head and cervical spine was performed following the standard protocol without intravenous contrast. Multiplanar CT image reconstructions of the cervical spine were also generated. COMPARISON:  Brain MRI dated 08/24/2014 and head CT dated 08/24/2014 FINDINGS: CT HEAD FINDINGS The ventricles are appropriate size for patient's age. There is a cavum septum pellucidum and cavum vergae measuring approximately 2 cm in diameter anteriorly. There is no acute intracranial  hemorrhage. No midline shift. The visualized paranasal sinuses and mastoid air cells are clear. The calvarium is intact. 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 The intervertebral disc spaces are preserved.The odontoid and spinous processes are intact.There is normal anatomic alignment of the C1-C2 lateral masses. The visualized soft tissues appear unremarkable. IMPRESSION: No acute intracranial pathology. Cavum septum pellucidum and cavum vergae similar in size compared to prior study. No acute/traumatic cervical spine pathology. Electronically Signed   By: Elgie CollardArash  Radparvar M.D.   On: 07/08/2015 20:29        Scheduled Meds: . azithromycin  500 mg Oral QHS  . [START ON 07/11/2015] cefdinir  300 mg  Oral BID  . enoxaparin (LOVENOX) injection  40 mg Subcutaneous Daily  . risperiDONE  0.5 mg Oral QHS   Continuous Infusions: . dextrose 5 % and 0.45% NaCl 75 mL/hr at 07/10/15 1035     LOS: 2 days    Time spent: 15min    Penny PiaVEGA, Tahisha Hakim, MD [facility] Pager [phone]  If 7PM-7AM, please contact night-coverage [email or web address] Password [email or web address] 07/10/2015, 3:26 PM

## 2015-07-11 ENCOUNTER — Encounter (HOSPITAL_COMMUNITY): Payer: Self-pay | Admitting: General Practice

## 2015-07-11 MED ORDER — CEFDINIR 125 MG/5ML PO SUSR: 300.0000 mg | Freq: Two times a day (BID) | ORAL | Status: DC

## 2015-07-11 MED ORDER — AZITHROMYCIN 200 MG/5ML PO SUSR: 500.0000 mg | Freq: Every day | ORAL | Status: DC

## 2015-07-11 NOTE — Care Management Note (Signed)
Case Management Note  Patient Details  Name: Drema Dallaslex Griffiths MRN: [id number] Date of Birth: 11/30/1995            Subjective/Objective: Patient admitted with asp pna. Patient w/ history MR/ Autism/ seizure. Mother died at birth. Patient has been cared for by grandmother and anticipate DC to home w/ grandmother. Grandmother denies needing assistance with meds or additional DME. She states he has [facility].   Action/Plan:  [facility]   Expected Discharge Date:                  Expected Discharge Plan:  Home/Self Care (PCS services in place)  In-House Referral:     Discharge planning Services  CM Consult  Post Acute Care Choice:    Choice offered to:     DME Arranged:    DME Agency:     HH Arranged:    HH [facility]  Status of Service:  Completed, signed off  Medicare Important Message Given:  Yes Date Medicare IM Given:    Medicare IM give by:    Date Additional Medicare IM Given:    Additional Medicare Important Message give by:     If discussed at Long Length of Stay Meetings, dates discussed:    Additional Comments:  Lawerance SabalDebbie Faun Mcqueen, RN 07/11/2015, 2:18 PM

## 2015-07-11 NOTE — Care Management Important Message (Signed)
Important Message  Patient Details  Name: Jonathan [name] MRN: [id number] Date of Birth: 05/02/1995   Medicare Important Message Given:  Yes    Bernadette HoitShoffner, Nicolina Hirt Coleman 07/11/2015, 8:53 AM

## 2015-07-11 NOTE — Progress Notes (Signed)
Nsg Discharge Note  Admit Date:  07/08/2015 Discharge date: 07/11/2015   Jonathan [name] to be D/C'd Home per MD order.  AVS completed.  Copy for chart, and copy for patient signed, and dated. Patient/caregiver able to verbalize understanding.  Discharge Medication:   Medication List    TAKE these medications        azithromycin 200 MG/5ML suspension  Commonly known as:  ZITHROMAX  Take 12.5 mLs (500 mg total) by mouth at bedtime.     cefdinir 125 MG/5ML suspension  Commonly known as:  OMNICEF  Take 12 mLs (300 mg total) by mouth 2 (two) times daily.     cetirizine 1 MG/ML syrup  Commonly known as:  ZYRTEC  TAKE 10ML'S BY MOUTH AT BEDTIME AS NEEDED FOR ALLERGY SYMPTOM CONTROL     DELSYM 30 MG/5ML liquid  Generic drug:  dextromethorphan  Take 60 mg by mouth as needed for cough.     risperiDONE 1 MG/ML oral solution  Commonly known as:  RISPERDAL  TAKE 0.5 MLS (0.5 MG TOTAL) BY MOUTH AT BEDTIME. FOR MANAGEMENT OF AGGITATION        Discharge Assessment: Filed Vitals:   07/10/15 2211 07/11/15 0506  BP: 128/85 95/75  Pulse: 80 107  Temp: 97.5 F (36.4 C) 97.4 F (36.3 C)  Resp: 16 16   Skin clean, dry and intact without evidence of skin break down, no evidence of skin tears noted. IV catheter discontinued intact. Site without signs and symptoms of complications - no redness or edema noted at insertion site, patient denies c/o pain - only slight tenderness at site.  Dressing with slight pressure applied.  D/c Instructions-Education: Discharge instructions given to patient/family with verbalized understanding. D/c education completed with patient/family including follow up instructions, medication list, d/c activities limitations if indicated, with other d/c instructions as indicated by MD - patient able to verbalize understanding, all questions fully answered. Patient instructed to return to ED, call 911, or call MD for any changes in condition.  Patient escorted via WC, and  D/C home via private auto.  Jonathan [name] Jonathan Loselaine, RN 07/11/2015 2:57 PM

## 2015-07-11 NOTE — Discharge Summary (Signed)
Physician Discharge Summary  Jonathan [name] JYN:[id number] DOB: 11-25-1995 DOA: 07/08/2015  PCP: Maree Erie, MD  Admit date: 07/08/2015 Discharge date: 07/11/2015  Time spent: > 35 minutes  Recommendations for Outpatient Follow-up:  1. Please monitor cbc on next hospital follow up and decide whether or not to prolong antibiotic regimen. Will d/c with 4 more days of antibiotics to complete a 7 day treatment course.   Discharge Diagnoses:  Active Problems:   Pneumonia   Discharge Condition: stable  Diet recommendation: as tolerated  Filed Weights   07/08/15 1710 07/09/15 0003  Weight: 49.442 kg (109 lb) 54.931 kg (121 lb 1.6 oz)    History of present illness:  20 y.o. male, With past medical history significant for mental retardation, seizures and autism brought by his grandmother today for evaluation of altered mental status of 2 days duration. The patient had an episode of nausea and vomiting today. In the emergency room the patient was found to be febrile at 101.4 . Patient could not give any history. His chest x-ray showed some patchy infiltrates on the right. No leukocytosis or significant abnormalities noted on his blood work.  Hospital Course:  Active Problems:  Pneumonia - transition to oral regimen (liquid form as patient does not swallow pills) monitor and see if patient tolerates medication without any side effects.   Procedures:  None  Consultations:  none  Discharge Exam: Filed Vitals:   07/10/15 2211 07/11/15 0506  BP: 128/85 95/75  Pulse: 80 107  Temp: 97.5 F (36.4 C) 97.4 F (36.3 C)  Resp: 16 16    General: Pt in nad, alert and awake Cardiovascular: no cyanosis Respiratory: no increased wob, no wheezes  Discharge Instructions   Discharge Instructions    Call MD for:  difficulty breathing, headache or visual disturbances    Complete by:  As directed      Call MD for:  persistant nausea and vomiting    Complete by:  As directed      Diet - low sodium heart healthy    Complete by:  As directed      Discharge instructions    Complete by:  As directed   F/u with your primary care physician in 1-2 weeks or sooner should any new concerns arise.     Increase activity slowly    Complete by:  As directed           Current Discharge Medication List    START taking these medications   Details  azithromycin (ZITHROMAX) 200 MG/5ML suspension Take 12.5 mLs (500 mg total) by mouth at bedtime. Qty: 22.5 mL, Refills: 0    cefdinir (OMNICEF) 125 MG/5ML suspension Take 12 mLs (300 mg total) by mouth 2 (two) times daily. Qty: 60 mL, Refills: 0      CONTINUE these medications which have NOT CHANGED   Details  cetirizine (ZYRTEC) 1 MG/ML syrup TAKE 10ML'S BY MOUTH AT BEDTIME AS NEEDED FOR ALLERGY SYMPTOM CONTROL Qty: 240 mL, Refills: 1    dextromethorphan (DELSYM) 30 MG/5ML liquid Take 60 mg by mouth as needed for cough.    risperiDONE (RISPERDAL) 1 MG/ML oral solution TAKE 0.5 MLS (0.5 MG TOTAL) BY MOUTH AT BEDTIME. FOR MANAGEMENT OF AGGITATION Qty: 30 mL, Refills: 0   Associated Diagnoses: Autism spectrum disorder associated with neurodevelopmental, mental or behavioral disorder, requiring substantial support (level 2)       No Known Allergies    The results of significant diagnostics from this hospitalization (including imaging,  microbiology, ancillary and laboratory) are listed below for reference.    Significant Diagnostic Studies: Dg Chest 2 View  07/08/2015  CLINICAL DATA:  Fall, altered mental status, vomiting for 3 days EXAM: CHEST  2 VIEW COMPARISON:  10/18/2014 FINDINGS: Cardiomediastinal silhouette is stable. There is patchy infiltrate in right lower lobe suspicious for aspiration pneumonia. No pulmonary edema. Left lung is clear. IMPRESSION: Patchy infiltrate in right lower lobe suspicious for aspiration pneumonia. No pulmonary edema. Electronically Signed   By: Natasha MeadLiviu  Pop M.D.   On: 07/08/2015 20:02   Ct  Head Wo Contrast  07/08/2015  CLINICAL DATA:  20 year old male with fall EXAM: CT HEAD WITHOUT CONTRAST CT CERVICAL SPINE WITHOUT CONTRAST TECHNIQUE: Multidetector CT imaging of the head and cervical spine was performed following the standard protocol without intravenous contrast. Multiplanar CT image reconstructions of the cervical spine were also generated. COMPARISON:  Brain MRI dated 08/24/2014 and head CT dated 08/24/2014 FINDINGS: CT HEAD FINDINGS The ventricles are appropriate size for patient's age. There is a cavum septum pellucidum and cavum vergae measuring approximately 2 cm in diameter anteriorly. There is no acute intracranial hemorrhage. No midline shift. The visualized paranasal sinuses and mastoid air cells are clear. The calvarium is intact. 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 The intervertebral disc spaces are preserved.The odontoid and spinous processes are intact.There is normal anatomic alignment of the C1-C2 lateral masses. The visualized soft tissues appear unremarkable. IMPRESSION: No acute intracranial pathology. Cavum septum pellucidum and cavum vergae similar in size compared to prior study. No acute/traumatic cervical spine pathology. Electronically Signed   By: Elgie CollardArash  Radparvar M.D.   On: 07/08/2015 20:29   Ct Cervical Spine Wo Contrast  07/08/2015  CLINICAL DATA:  20 year old male with fall EXAM: CT HEAD WITHOUT CONTRAST CT CERVICAL SPINE WITHOUT CONTRAST TECHNIQUE: Multidetector CT imaging of the head and cervical spine was performed following the standard protocol without intravenous contrast. Multiplanar CT image reconstructions of the cervical spine were also generated. COMPARISON:  Brain MRI dated 08/24/2014 and head CT dated 08/24/2014 FINDINGS: CT HEAD FINDINGS The ventricles are appropriate size for patient's age. There is a cavum  septum pellucidum and cavum vergae measuring approximately 2 cm in diameter anteriorly. There is no acute intracranial hemorrhage. No midline shift. The visualized paranasal sinuses and mastoid air cells are clear. The calvarium is intact. 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 The intervertebral disc spaces are preserved.The odontoid and spinous processes are intact.There is normal anatomic alignment of the C1-C2 lateral masses. The visualized soft tissues appear unremarkable. IMPRESSION: No acute intracranial pathology. Cavum septum pellucidum and cavum vergae similar in size compared to prior study. No acute/traumatic cervical spine pathology. Electronically Signed   By: Elgie CollardArash  Radparvar M.D.   On: 07/08/2015 20:29    Microbiology: Recent Results (from the past 240 hour(s))  Culture, blood (Routine X 2) w Reflex to ID Panel     Status: None (Preliminary result)   Collection Time: 07/08/15  8:50 PM  Result Value Ref Range Status   Specimen Description BLOOD LEFT WRIST  Final   Special Requests BOTTLES DRAWN AEROBIC AND ANAEROBIC 5CC  Final   Culture NO GROWTH 1 DAY  Final   Report Status PENDING  Incomplete  Culture, blood (Routine X 2) w Reflex to ID Panel     Status: None (Preliminary result)   Collection Time: 07/08/15  8:55 PM  Result Value Ref Range Status   Specimen Description BLOOD RIGHT HAND  Final   Special Requests BOTTLES DRAWN AEROBIC AND ANAEROBIC 5CC   Final   Culture NO GROWTH 1 DAY  Final   Report Status PENDING  Incomplete     Labs: Basic Metabolic Panel:  Recent Labs Lab 07/08/15 1915 07/09/15 0521  NA 143 139  K 4.3 3.6  CL 105 106  CO2 27 28  GLUCOSE 64* 108*  BUN 9 6  CREATININE 0.66 0.66  CALCIUM 10.1 8.6*   Liver Function Tests:  Recent Labs Lab 07/08/15 1915  AST 30  ALT 22  ALKPHOS 93  BILITOT 0.6  PROT  8.8*  ALBUMIN 4.9   No results for input(s): LIPASE, AMYLASE in the last 168 hours. No results for input(s): AMMONIA in the last 168 hours. CBC:  Recent Labs Lab 07/08/15 1915 07/09/15 0521  WBC 6.5 12.0*  NEUTROABS 5.6  --   HGB 17.1* 12.5*  HCT 52.2* 38.8*  MCV 83.8 83.6  PLT 214 188   Cardiac Enzymes:  Recent Labs Lab 07/08/15 2052  CKTOTAL 201   BNP: BNP (last 3 results) No results for input(s): BNP in the last 8760 hours.  ProBNP (last 3 results) No results for input(s): PROBNP in the last 8760 hours.  CBG: No results for input(s): GLUCAP in the last 168 hours.   Signed:  Penny Pia MD.  [facility] 07/11/2015, 2:08 PM

## 2015-07-14 LAB — CULTURE, BLOOD (ROUTINE X 2)
CULTURE: NO GROWTH
CULTURE: NO GROWTH

## 2015-07-19 ENCOUNTER — Ambulatory Visit (INDEPENDENT_AMBULATORY_CARE_PROVIDER_SITE_OTHER): Payer: Medicare Other | Admitting: Pediatrics

## 2015-07-19 ENCOUNTER — Ambulatory Visit: Payer: Medicare Other

## 2015-07-19 VITALS — Temp 97.4°F | Wt 109.0 lb

## 2015-07-19 DIAGNOSIS — Z09 Encounter for follow-up examination after completed treatment for conditions other than malignant neoplasm: Secondary | ICD-10-CM

## 2015-07-19 MED ORDER — IBUPROFEN 100 MG/5ML PO SUSP: 400.0000 mg | Freq: Three times a day (TID) | ORAL | Status: DC | PRN

## 2015-07-19 NOTE — Progress Notes (Signed)
History was provided by the grandmother.  Jonathan [name] is a 20 y.o. male presents hospital follow-up.  He was admitted to the hospital for pneumonia June 10th and discharged on the 13th.  He as sent home with 4 days of Cefdinir that he completed it 4 days ago He had some diarrhea that resolved.  He had a "mild subjective fever in his stomach".  He has been himself since finishing the antibiotics.     The following portions of the patient's history were reviewed and updated as appropriate: allergies, current medications, past family history, past medical history, past social history, past surgical history and problem list.  Review of Systems  Constitutional: Negative for fever and weight loss.  HENT: Negative for congestion, ear discharge, ear pain and sore throat.   Eyes: Negative for pain, discharge and redness.  Respiratory: Negative for cough and shortness of breath.   Cardiovascular: Negative for chest pain.  Gastrointestinal: Negative for vomiting and diarrhea.  Genitourinary: Negative for frequency and hematuria.  Musculoskeletal: Negative for back pain, falls and neck pain.  Skin: Negative for rash.  Neurological: Negative for speech change, loss of consciousness and weakness.  Endo/Heme/Allergies: Does not bruise/bleed easily.  Psychiatric/Behavioral: The patient does not have insomnia.      Physical Exam:  Temp(Src) 97.4 F (36.3 C) (Temporal)  Wt 109 lb (49.442 kg)  Facility age limit for growth percentiles is 20 years. HR: 70 RR: 20   General:   alert, cooperative, appears stated age and no distress  Neck:  Neck appearance: Normal  Lungs:  clear to auscultation bilaterally, no crackles, no wheezes, no increased work of breathing   Heart:   regular rate and rhythm, S1, S2 normal, no murmur, click, rub or gallop   Neuro:  normal without focal findings     Assessment/Plan: 1. Hospital discharge follow-up Patient is doing well.  Mom and caretaker had no concerns except  for their Risperadal wasn't covered my insurance the last time they went to go pick it up.  Jonathan [name] called the pharmacy to make sure another script wasn't needed because now it is on the Formulary list to be covered by Medicaid.  I wrote a script for motrin suspension to be used as needed.      Jonathan Griffith CitronNicole Grier, MD  07/19/2015

## 2015-07-30 ENCOUNTER — Other Ambulatory Visit: Payer: Self-pay | Admitting: Pediatrics

## 2015-08-15 ENCOUNTER — Other Ambulatory Visit: Payer: Self-pay | Admitting: Pediatrics

## 2015-08-15 DIAGNOSIS — J309 Allergic rhinitis, unspecified: Secondary | ICD-10-CM

## 2015-08-15 DIAGNOSIS — F840 Autistic disorder: Secondary | ICD-10-CM

## 2015-08-17 MED ORDER — CETIRIZINE HCL 1 MG/ML PO SYRP: ORAL_SOLUTION | ORAL | Status: DC

## 2015-08-21 ENCOUNTER — Encounter (HOSPITAL_COMMUNITY): Payer: Self-pay | Admitting: *Deleted

## 2015-08-21 ENCOUNTER — Ambulatory Visit (HOSPITAL_COMMUNITY)
Admission: EM | Admit: 2015-08-21 | Discharge: 2015-08-21 | Disposition: A | Discharge status: Home / Self Care | Payer: Medicare Other | Attending: Family Medicine | Admitting: Family Medicine

## 2015-08-21 DIAGNOSIS — S0181XA Laceration without foreign body of other part of head, initial encounter: Secondary | ICD-10-CM | POA: Diagnosis not present

## 2015-08-21 NOTE — ED Provider Notes (Signed)
CSN: [id number]     Arrival date & time 08/21/15  1910 History   None    No chief complaint on file.  (Consider location/radiation/quality/duration/timing/severity/associated sxs/prior Treatment) Patient fell on floor lacerating chin today.   The history is provided by the patient.  Laceration  Location:  Face Facial laceration location:  Chin Depth:  Cutaneous Quality: straight   Time since incident:  3 hours Laceration mechanism:  Fall Pain details:    Quality:  Aching   Severity:  Moderate   Timing:  Constant   Progression:  Worsening Foreign body present:  No foreign bodies Relieved by:  Nothing Worsened by:  Nothing Ineffective treatments:  None tried Tetanus status:  Up to date   Past Medical History:  Diagnosis Date  . Allergic rhinitis   . Autism   . Mental retardation, idiopathic severe   . Pneumonia 06/2015  . Seizure (HCC) 08/24/2014   Past Surgical History:  Procedure Laterality Date  . NO PAST SURGERIES     Family History  Problem Relation Age of Onset  . Obesity Other   . Stroke Other   . Hypertension Other   . Arthritis Paternal Grandmother   . Seizures Maternal Uncle    Social History  Substance Use Topics  . Smoking status: Never Smoker  . Smokeless tobacco: Never Used  . Alcohol use No    Review of Systems  Constitutional: Negative.   HENT: Negative.   Eyes: Negative.   Respiratory: Negative.   Gastrointestinal: Negative.   Endocrine: Negative.   Genitourinary: Negative.   Musculoskeletal: Negative.   Skin: Positive for wound.  Allergic/Immunologic: Negative.   Neurological: Negative.   Hematological: Negative.   Psychiatric/Behavioral: Negative.     Allergies  Review of patient's allergies indicates no known allergies.  Home Medications   Prior to Admission medications   Medication Sig Start Date End Date Taking? Authorizing Provider  cetirizine (ZYRTEC) 1 MG/ML syrup TAKE 10ML'S BY MOUTH AT BEDTIME AS NEEDED FOR ALLERGY  SYMPTOM CONTROL 08/17/15   Maree Erie, MD  ibuprofen (CHILD IBUPROFEN) 100 MG/5ML suspension Take 20 mLs (400 mg total) by mouth every 8 (eight) hours as needed for fever or mild pain. 07/19/15   Cherece Griffith Citron, MD  risperiDONE (RISPERDAL) 1 MG/ML oral solution TAKE 0.5 MLS (0.5 MG TOTAL) BY MOUTH AT BEDTIME. FOR MANAGEMENT OF Alda Berthold 08/17/15   Maree Erie, MD   Meds Ordered and Administered this Visit  Medications - No data to display  BP 144/88 (BP Location: Left Arm)   Pulse 75   Temp 97.8 F (36.6 C) (Axillary)   SpO2 100%  No data found.   Physical Exam  Constitutional: He appears well-developed and well-nourished.  HENT:  Head: Normocephalic and atraumatic.  Eyes: EOM are normal. Pupils are equal, round, and reactive to light.  Cardiovascular: Normal rate and regular rhythm.   Pulmonary/Chest: Effort normal and breath sounds normal.  Skin:  1 cm laceration to chin    Urgent Care Course   Clinical Course    .Marland KitchenLaceration Repair Date/Time: 08/21/2015 9:10 PM Performed by: Deatra Canter Authorized by: Deatra Canter   Consent:    Consent obtained:  Verbal   Consent given by:  Patient   Risks discussed:  Pain and need for additional repair   Alternatives discussed:  No treatment Anesthesia (see MAR for exact dosages):    Anesthesia method:  Local infiltration   Local anesthetic:  Lidocaine 2% WITH epi Laceration details:  Location:  Face   Length (cm):  1 Repair type:    Repair type:  Simple Exploration:    Contaminated: no   Treatment:    Area cleansed with:  Betadine   Amount of cleaning:  Standard   Irrigation solution:  Sterile saline Skin repair:    Repair method:  Sutures   Suture size:  4-0   Suture material:  Nylon Approximation:    Approximation:  Close   Vermilion border: well-aligned   Post-procedure details:    Dressing:  Antibiotic ointment and adhesive bandage   Patient tolerance of procedure:  Tolerated well, no  immediate complications    (including critical care time)  Labs Review Labs Reviewed - No data to display  Imaging Review No results found.   Visual Acuity Review  Right Eye Distance:   Left Eye Distance:   Bilateral Distance:    Right Eye Near:   Left Eye Near:    Bilateral Near:         MDM  Chin laceration 3 4.0 sutures in chin Follow up in 7 days for suture removal    Deatra Canter, FNP 08/21/15 2112    Deatra Canter, FNP 08/21/15 2112

## 2015-08-28 ENCOUNTER — Ambulatory Visit: Payer: Medicare Other | Admitting: Pediatrics

## 2015-09-01 ENCOUNTER — Ambulatory Visit (INDEPENDENT_AMBULATORY_CARE_PROVIDER_SITE_OTHER): Payer: Medicare Other | Admitting: Pediatrics

## 2015-09-01 DIAGNOSIS — Z4802 Encounter for removal of sutures: Secondary | ICD-10-CM | POA: Diagnosis not present

## 2015-09-01 DIAGNOSIS — S0181XA Laceration without foreign body of other part of head, initial encounter: Secondary | ICD-10-CM

## 2015-09-01 DIAGNOSIS — S0181XD Laceration without foreign body of other part of head, subsequent encounter: Secondary | ICD-10-CM | POA: Diagnosis not present

## 2015-09-01 NOTE — Patient Instructions (Signed)
3 sutures removed; please call if any problems noted.

## 2015-09-03 ENCOUNTER — Encounter: Payer: Self-pay | Admitting: Pediatrics

## 2015-09-03 NOTE — Progress Notes (Signed)
Subjective:     Patient ID: Jonathan [name], male   DOB: 02/18/1995, 20 y.o.   MRN: [id number]  HPI [name] is a 20 years old young man with autism, cerebral palsy and seizure disorder here today for removal of sutures from his chin.  He is accompanied by his paternal grandmother who is his guardian and primary caretaker. Kamaury injured his chin in a fall and presented to the ED on 08/21/15.  Review of records shows 4 sutures were placed. He is doing well and has had no difficulty with the laceration closure; gm states he is his usual self. She states they missed the initial appointment for suture removal because her sister was driving and they could not remember what building housed this practice.  PMH, problem list, medications and allergies, family and social history reviewed and updated as indicated.  Review of Systems  Constitutional: Negative for activity change, appetite change and fever.  Skin: Negative for rash and wound.       Objective:   Physical Exam  Constitutional: He appears well-developed and well-nourished. No distress.  Skin:  Well healed laceration at the chin with 3 sutures visible on the left.  Nursing note and vitals reviewed.      Assessment:     1. Laceration of chin, initial encounter   2. Visit for suture removal       Plan:     Area visualized under 2X magnification; successful removal of 3 sutures with no complications.  No 4th suture found. Advised GM on routine care and follow-up as needed. He is to return for his annual wellness visit in September and paperwork is submitted for referral to [facility] for continued care as an adult.  Maree ErieStanley, Angela J, MD

## 2015-09-10 ENCOUNTER — Encounter (HOSPITAL_COMMUNITY): Payer: Self-pay | Admitting: *Deleted

## 2015-09-10 ENCOUNTER — Emergency Department (HOSPITAL_COMMUNITY)
Admission: EM | Admit: 2015-09-10 | Discharge: 2015-09-10 | Disposition: A | Discharge status: Home / Self Care | Payer: Medicare Other | Attending: Emergency Medicine | Admitting: Emergency Medicine

## 2015-09-10 DIAGNOSIS — F849 Pervasive developmental disorder, unspecified: Secondary | ICD-10-CM | POA: Diagnosis not present

## 2015-09-10 DIAGNOSIS — R569 Unspecified convulsions: Secondary | ICD-10-CM

## 2015-09-10 DIAGNOSIS — G40909 Epilepsy, unspecified, not intractable, without status epilepticus: Secondary | ICD-10-CM | POA: Diagnosis not present

## 2015-09-10 LAB — CBC WITH DIFFERENTIAL/PLATELET
BASOS PCT: 1 %
Basophils Absolute: 0 10*3/uL (ref 0.0–0.1)
EOS ABS: 0 10*3/uL (ref 0.0–0.7)
EOS PCT: 1 %
HEMATOCRIT: 41.8 % (ref 39.0–52.0)
Hemoglobin: 14.1 g/dL (ref 13.0–17.0)
LYMPHS PCT: 25 %
Lymphs Abs: 0.9 10*3/uL (ref 0.7–4.0)
MCH: 27.5 pg (ref 26.0–34.0)
MCHC: 33.7 g/dL (ref 30.0–36.0)
MCV: 81.6 fL (ref 78.0–100.0)
MONO ABS: 0.2 10*3/uL (ref 0.1–1.0)
MONOS PCT: 5 %
NEUTROS ABS: 2.4 10*3/uL (ref 1.7–7.7)
NEUTROS PCT: 68 %
Platelets: 193 10*3/uL (ref 150–400)
RBC: 5.12 MIL/uL (ref 4.22–5.81)
RDW: 14 % (ref 11.5–15.5)
WBC: 3.6 10*3/uL — AB (ref 4.0–10.5)

## 2015-09-10 LAB — BASIC METABOLIC PANEL
ANION GAP: 11 (ref 5–15)
BUN: 8 mg/dL (ref 6–20)
CALCIUM: 9.4 mg/dL (ref 8.9–10.3)
CO2: 21 mmol/L — AB (ref 22–32)
Chloride: 107 mmol/L (ref 101–111)
Creatinine, Ser: 0.77 mg/dL (ref 0.61–1.24)
GFR calc non Af Amer: >60 mL/min (ref >60)
Glucose, Bld: 98 mg/dL (ref 65–99)
POTASSIUM: 4.8 mmol/L (ref 3.5–5.1)
Sodium: 139 mmol/L (ref 135–145)

## 2015-09-10 LAB — CBG MONITORING, ED: Glucose-Capillary: 97 mg/dL (ref 65–99)

## 2015-09-10 MED ORDER — SODIUM CHLORIDE 0.9 % IV BOLUS (SEPSIS)
500.0000 mL | Freq: Once | INTRAVENOUS | Status: AC
  Administered 2015-09-10: 500 mL via INTRAVENOUS

## 2015-09-10 MED ORDER — LEVETIRACETAM 100 MG/ML PO SOLN: 500.0000 mg | Freq: Two times a day (BID) | ORAL | 12 refills | Status: DC

## 2015-09-10 MED ORDER — LEVETIRACETAM 100 MG/ML PO SOLN
500.0000 mg | Freq: Once | ORAL | Status: AC
  Administered 2015-09-10: 500 mg via ORAL
  Filled 2015-09-10 (×2): qty 5

## 2015-09-10 NOTE — ED Provider Notes (Signed)
[facility]-EMERGENCY DEPT Provider Note   CSN: [id number] Arrival date & time: 09/10/15  1113  First Provider Contact:  First MD Initiated Contact with Patient 09/10/15 1123        History   Chief Complaint Chief Complaint  Patient presents with  . Seizures    HPI Jonathan [name] is a 20 y.o. male presenting with a seizure. History is taken from the grandmother who is the primary caregiver. Patient has a history of mental retardation and is unable to provide any history. While she was getting him dressed today he had about a 2 minute seizure. Started with diffuse stiffening and neck extension and then patient had diffuse shaking. Patient is currently back to his mental baseline. He is nonverbal at baseline. He has not been sick recently including no fevers, significant cough, urine changes, vomiting, diarrhea. Patient has a history of seizures, starting in the summer of 2016. Last seizure was in September 2016. Follows with neurology as an outpatient and had his Keppra weaned off in April 2017. Developed a foot drop last year, grandmother thinks is from the Keppra which is why the reasons why he was weaned off.  HPI  Past Medical History:  Diagnosis Date  . Allergic rhinitis   . Autism   . Mental retardation, idiopathic severe   . Pneumonia 06/2015  . Seizure (HCC) 08/24/2014    Patient Active Problem List   Diagnosis Date Noted  . Pneumonia 07/08/2015  . Mental retardation   . Autism spectrum disorder 05/24/2015  . Spells of trembling 05/24/2015  . Paroxysmal spells (HCC) 05/24/2015  . Mental retardation, severe (I.Q. 20-34) 05/24/2015  . Autism spectrum disorder with accompanying intelllectual impairment, requiring very subtantial support (level 3) 02/23/2015  . Developmental non-verbal disorder 02/23/2015  . Premature births 02/23/2015  . Seizure cerebral (HCC) 02/23/2015  . Aspiration pneumonia (HCC) 10/20/2014  . Sinus tachycardia (HCC)   . Hypokalemia   . Hyperglycemia  10/18/2014  . Lactic acidosis 10/18/2014  . Seizure disorder, status epilepticus, convulsive (HCC) 09/01/2014  . Autism spectrum disorder associated with neurodevelopmental, mental or behavioral disorder, requiring substantial support (level 2) 09/01/2014  . Seizure (HCC) 08/24/2014  . BMI less than 19,adult 07/15/2013  . Allergic rhinitis, unspecified allergic rhinitis type 07/15/2013  . Constipation, unspecified constipation type 07/15/2013    Past Surgical History:  Procedure Laterality Date  . NO PAST SURGERIES         Home Medications    Prior to Admission medications   Medication Sig Start Date End Date Taking? Authorizing Provider  cetirizine (ZYRTEC) 1 MG/ML syrup TAKE 10ML'S BY MOUTH AT BEDTIME AS NEEDED FOR ALLERGY SYMPTOM CONTROL Patient taking differently: Take 10 mg by mouth at bedtime as needed (for allergies).  08/17/15  Yes Maree Erie, MD  ibuprofen (CHILD IBUPROFEN) 100 MG/5ML suspension Take 20 mLs (400 mg total) by mouth every 8 (eight) hours as needed for fever or mild pain. 07/19/15  Yes Cherece Griffith Citron, MD  risperiDONE (RISPERDAL) 1 MG/ML oral solution TAKE 0.5 MLS (0.5 MG TOTAL) BY MOUTH AT BEDTIME. FOR MANAGEMENT OF AGGITATION 08/17/15  Yes Maree Erie, MD  levETIRAcetam (KEPPRA) 100 MG/ML solution Take 5 mLs (500 mg total) by mouth 2 (two) times daily. 09/10/15   Pricilla Loveless, MD    Family History Family History  Problem Relation Age of Onset  . Arthritis Paternal Grandmother   . Obesity Other   . Stroke Other   . Hypertension Other   . Seizures Maternal  Uncle     Social History Social History  Substance Use Topics  . Smoking status: Never Smoker  . Smokeless tobacco: Never Used  . Alcohol use No     Allergies   Review of patient's allergies indicates no known allergies.   Review of Systems Review of Systems  Unable to perform ROS: Patient nonverbal     Physical Exam Updated Vital Signs BP 123/70 (BP Location: Right  Arm)   Pulse 75   Temp 97.5 F (36.4 C) (Oral)   Resp 16   Ht 5' 9.5" (1.765 m)   Wt 112 lb (50.8 kg)   SpO2 96%   BMI 16.30 kg/m   Physical Exam  Constitutional: He appears well-developed and well-nourished.  HENT:  Head: Normocephalic and atraumatic.  Right Ear: External ear normal.  Left Ear: External ear normal.  Nose: Nose normal.  Eyes: Right eye exhibits no discharge. Left eye exhibits no discharge.  Neck: Neck supple.  Cardiovascular: Normal rate, regular rhythm and normal heart sounds.   Pulmonary/Chest: Effort normal and breath sounds normal.  Abdominal: Soft. He exhibits no distension. There is no tenderness.  Musculoskeletal: He exhibits no edema.  Neurological: He is alert.  Nonverbal. Makes occasional noises but does not speak. Moves all 4 extremities equally when asked  Skin: Skin is warm and dry.  Nursing note and vitals reviewed.    ED Treatments / Results  Labs (all labs ordered are listed, but only abnormal results are displayed) Labs Reviewed  BASIC METABOLIC PANEL - Abnormal; Notable for the following:       Result Value   CO2 21 (*)    All other components within normal limits  CBC WITH DIFFERENTIAL/PLATELET - Abnormal; Notable for the following:    WBC 3.6 (*)    All other components within normal limits  CBC WITH DIFFERENTIAL/PLATELET  CBG MONITORING, ED    EKG  EKG Interpretation  Date/Time:  Sunday September 10 2015 11:24:35 EDT Ventricular Rate:  76 PR Interval:    QRS Duration: 77 QT Interval:  362 QTC Calculation: 407 R Axis:   74 Text Interpretation:  Sinus rhythm Probable left atrial enlargement Borderline ST elevation, anterior leads no significant change since June 2017 Confirmed by Criss Alvine MD, Kwinton Maahs [id number]) on 09/10/2015 11:45:36 AM       Radiology No results found.  Procedures Procedures (including critical care time)  Medications Ordered in ED Medications  levETIRAcetam (KEPPRA) 100 MG/ML solution 500 mg (not  administered)  sodium chloride 0.9 % bolus 500 mL (0 mLs Intravenous Stopped 09/10/15 1230)     Initial Impression / Assessment and Plan / ED Course  I have reviewed the triage vital signs and the nursing notes.  Pertinent labs & imaging results that were available during my care of the patient were reviewed by me and considered in my medical decision making (see chart for details).  Clinical Course  Comment By Time  Patient is currently at baseline. 2 minute seizure is first since September. Will check labs, likely consult neuro for med recs. Pricilla Loveless, MD 08/13 1134  Dr. Hilda Blades recommends going back on keppra, especially as this should not have caused foot drop. Call neurology tomorrow and change if needed. Otherwise at baseline without further seizure. BMP benign, mildly low bicarb likely from seizure.  CBC clotted, will d/c if no significant abnormalities.  Pricilla Loveless, MD 08/13 1314  CBC with leukopenia. However he shows no other signs of infection, no fevers, no AMS. Will have  rechecked with PCP. F/u closely with neuro. D/c with keppra, return if symptoms recur/worsen. Pricilla LovelessScott Dilana Mcphie, MD 08/13 1338    Final Clinical Impressions(s) / ED Diagnoses   Final diagnoses:  Seizure (HCC)    New Prescriptions New Prescriptions   LEVETIRACETAM (KEPPRA) 100 MG/ML SOLUTION    Take 5 mLs (500 mg total) by mouth 2 (two) times daily.     Pricilla LovelessScott Ronzell Laban, MD 09/10/15 1505

## 2015-09-10 NOTE — ED Triage Notes (Signed)
Pt here via [facility] for seizure.  Pt is autistic with hx of seizures and seizure meds were discontinued 1 year ago.  Pt had 1 minute seziure observed by grandmother, as she was changing him.

## 2015-09-19 ENCOUNTER — Encounter: Payer: Self-pay | Admitting: Neurology

## 2015-09-19 ENCOUNTER — Ambulatory Visit (INDEPENDENT_AMBULATORY_CARE_PROVIDER_SITE_OTHER): Payer: Medicare Other | Admitting: Neurology

## 2015-09-19 VITALS — Resp 20 | Ht 69.0 in | Wt 112.0 lb

## 2015-09-19 DIAGNOSIS — F840 Autistic disorder: Secondary | ICD-10-CM

## 2015-09-19 MED ORDER — LEVETIRACETAM 100 MG/ML PO SOLN: 500.0000 mg | Freq: Two times a day (BID) | ORAL | 12 refills | Status: DC

## 2015-09-19 NOTE — Progress Notes (Signed)
Provider:  Melvyn Novas, M D  Referring Provider: Maree Erie, MD Primary Care Physician:  Maree Erie, MD  Chief Complaint  Patient presents with  . Follow-up    had seizure on 09/10/2015, did not follow up with EMU--"too much"  severe mental retardation, autism, severe disability.  HPI:  Amadeus Oyama is a 20 y.o. male . seen here as a referral from PCP, Dr. [name] .   Jasai Sorg carries a diagnosis of autism with severe cognitive and intellectual delay. He had never had seizures until July 27 when his mother witnessed a generalized seizure. With an arrival time at the ER he developed a second seizure which was witnessed by staff. The first seizure at home lasted for probably less than a minute as tonic-clonic convulsions but he did not lose consciousness following the seizure he appeared postictal and very drowsy. The patient had a similar event was in 2 hours later in the emergency room witnessed by a PA. The on-call neurologist decided to give Her loading dose IV. The patient was brought in for CT of the head which did not show any acute changes there was no evidence of a bleed, infection, swelling. Had not been back to baseline before the second seizure occurred so this would be considered a status epilepticus. The patient is usually noncommunicative and nonverbal due to his autism. His primary care physician is Dr. [name] I asked his mother who was attending this visit if we can obtain appropriate previous diagnostic records and test results regarding [facility] health. Apparently he was tested in [place] where he was born and diagnosed with cerebral palsy, later worked up for dysphagia and chronic constipation. The patient is very slender and appears younger than his American age.  His grandmother moved him  to the [place] area but he has seen physicians at [facility] , Dr Christance(?)and [name], GI, and seen  in [place],  [place].  [name]  had a dramatic birth history. His mother suffered from a sepsis during pregnancy and died after meningitis and she was brain dead but left on life support measures as she was pregnant Gabreil was taken from the warm at gestational age the seventh month of pregnancy. His mother had been comatose since the fifth week of pregnancy. He weighted 2 pounds and 2 ounces and  fit into the hand of his father . He stayed for a long time in the NICU until released. At age [age] year he suffered a bout of pneumonia. He never became fully verbal he is usually nonverbal but produces sounds that he repeats continuously. He learned crawling at 9-10 months ,walking at 14 months but was always tiptoeing. Thoms has is baseline some visual impairment but I don't know how to grade this on a test ,given his low ability to cooperate with a vision test / visual field test.  History from 01-12-15 Since I have seen Mr. Valdes last time about 15 months ago he has significantly changed in his ability to cognitively connect, follow guidance. He still performs repetitively the same tasks and movements but he is completely nonverbal. September he was admitted to hospital after a bout of seizures and stayed for 3 days. He had multiple tests there was no infection found no inflammation no injury. His seizures were just attributed to his autism and underlying disorder. He did develop pneumonia after seizure probably aspiration related. In the meantime his family has noted that he is  more rigid he is no longer able to climb stairs, or at least not reliably so. This did not used to be a problem last year. His right foot has slightly inverted and his right knee seems to be permanently flexed and not extended. He also has a much higher ankle tone. He performs repetitively movements with his right hand.  His appetite has not changed , he is a "good eater" according to mom. But he eats with long pauses, seems to stare off, unable to handle the food- he  has begun to drool which was not the case last year either. He dropped frequently . This seems to encompass all seizure types seen in [place] Gestaut syndrome. He has staring attacks, drop attacks, convulsions. I demonstrated the recent MRI findings. Piotr does have an abnormal corpus callosum, but I don't see any scar tissue in the subcortical areas. This cannot explain why his right foot is inverted unless he had some kind of more peripheral injury there is no evidence of stroke or abscess etc. What I would like to do is to consider Kron for a VNS stimulator. There is also [name] specific medication that he has not been placed on thus far. The patient is maintained on Risperdal which is treating mostly behavior issues - I am unsure if this medication can continue .  While he did have 2 unexplained seizures  2015 , the seizure activity has progressed since then more frequent and Seldon seems to have lost some milestones.  He is less able to ambulate safely he seems to have frequent interruptions within activities-  even while he is eating-  that could be seizures staring attacks.  He also had some tonic drop attacks and convulsive seizures. I think it is necessary to change his medication to an O start specific medication and in addition I would like for him to be established as a possible VNS candidate. MRI reviewed.  - no bleed trauma or sepsis identified. However is underlying condition makes him prone to have epileptic seizures and seizures are more common in the autistic population as well as in the cerebral palsy population. Remains for now on Keppra. I will start FYCOMPA, at 2 mg liquid form given at bedtime once a day only. After 14 days I would like a call back abut his development on the medication- Shoukd he have still seizures I will  him to increase the dose from 2 mg to 4 mg nightly only.  We will then see if his seizure frequency is reduced or not and rely on his families report. His  mother keeps a journal.     02-23-2015 Mr. Dishman is seen here today for a right scheduled follow-up visit. After our last visit 6 weeks ago he was started on file comp. He did well on file comp alone and seems not to have had more seizure activity but when he restarted or talk Risperdal this fight, he became very sedated, begun drooling, was more difficult to guide. His he was less ambulatory as well -almost a little bit" drugged". The in women's lives on an upstairs apartment and he could not manage to walk the stairs up. For this reason his primary care physician tried to reduce the Risperdal first seeing if he could tolerate the medication but this led to him being more aggressive and easily agitated. We have also discussed the implantation of a VNS stimulator. Since the VNS stimulator is not a medication and free of meatbolic side effects-  and may be able to support seizure control even if Fycompa is only tolerated or fully effective at a lower dose.  Interval history from 05/24/2015. [name] has undergone and prolonged EEG study which showed no epileptiform activity only eyeblink and rhythmic muscle artifact from motion. At home his behaviors have escalated and grandma states that at times [name] seems to be more impatient, easier agitated to some degree more hyper and  more difficult to guide. Anecdotally, she tells me that he has a new behavior pattern for example when they eat at a restaurant he has difficulties waiting for the food to arrive which didn't used to be a problem. Now he will sometimes try to take other people's food or knock off the plates. He is constantly tapping. He is scratching behind his right ear, he is also clapping more and more often more constantly. In other ways he seems to be under a lot more tension and impulse. Unfortunately he has also destroyed a few items of his grandmother's that were precious memories to her. This did not used to happen in the past and this behavior  seems to be escalated since February. I am concerned that he does not have seizures as much as a behavior related problem.  Aurther will be 20 at this 1.st of June.I am very unsure now if he has seizures, and an EMU stay would be very hard for him.   Interval history from 09/19/2015, [name] is seen here today after having suffered multiple spells that were described as seizure activity and being evaluated in the hospital's emergency room without being admitted to hospital. He has been more agitated more ritualistic more perseverating recently producing a lot of nonverbal noises. He appears somewhat restless and under pressure. His grandmother is very overwhelmed.  ED Drema Dallaslex Speelman is a 20 y.o. male presenting with a seizure. History is taken from the grandmother who is the primary caregiver. Patient has a history of mental retardation and is unable to provide any history. While she was getting him dressed today he had about a 2 minute seizure. Started with diffuse stiffening and neck extension and then patient had diffuse shaking. Patient is currently back to his mental baseline. He is nonverbal at baseline. He has not been sick recently including no fevers, significant cough, urine changes, vomiting, diarrhea. Patient has a history of seizures, starting in the summer of 2016. Last seizure was in September 2016. Follows with neurology as an outpatient and had his Keppra weaned off in April 2017. Developed a foot drop last year, grandmother thinks is from the Keppra which is why the reasons why he was weaned off.  The Ed started him back on Keppra.   Review of Systems: Out of a complete 14 system review, the patient complains of only the following symptoms, and all other reviewed systems are negative.;  Born with developmental deficits. autism , non verbal, MRDD -  Past Medical History:  Diagnosis Date  . Allergic rhinitis   . Autism   . Mental retardation, idiopathic severe   . Pneumonia 06/2015  .  Seizure (HCC) 08/24/2014    Past Surgical History:  Procedure Laterality Date  . NO PAST SURGERIES      Current Outpatient Prescriptions  Medication Sig Dispense Refill  . cetirizine (ZYRTEC) 1 MG/ML syrup TAKE 10ML'S BY MOUTH AT BEDTIME AS NEEDED FOR ALLERGY SYMPTOM CONTROL (Patient taking differently: Take 10 mg by mouth at bedtime as needed (for allergies). ) 240 mL 2  . ibuprofen (  CHILD IBUPROFEN) 100 MG/5ML suspension Take 20 mLs (400 mg total) by mouth every 8 (eight) hours as needed for fever or mild pain. 473 mL 5  . levETIRAcetam (KEPPRA) 100 MG/ML solution Take 5 mLs (500 mg total) by mouth 2 (two) times daily. 473 mL 12  . risperiDONE (RISPERDAL) 1 MG/ML oral solution TAKE 0.5 MLS (0.5 MG TOTAL) BY MOUTH AT BEDTIME. FOR MANAGEMENT OF AGGITATION 30 mL 0   No current facility-administered medications for this visit.     Allergies as of 09/19/2015  . (No Known Allergies)    Vitals: Resp 20   Ht 5\' 9"  (1.753 m)   Wt 112 lb (50.8 kg)   BMI 16.54 kg/m  Last Weight:  Wt Readings from Last 1 Encounters:  09/19/15 112 lb (50.8 kg)   Last Height:   Ht Readings from Last 1 Encounters:  09/19/15 5\' 9"  (1.753 m)    Physical exam:  General: The patient is awake, alert and appears not in acute distress. The patient is well groomed. Head: Normocephalic, atraumatic. Neck is supple. Mallampati 2, neck circumference:13  Cardiovascular:  Regular rate and rhythm , without  murmurs or carotid bruit, and without distended neck veins. Respiratory: Lungs are clear to auscultation. Skin:  Without evidence of edema, or rash Trunk: BMI is normal posture.  Neurologic exam : The patient is awake and alert, oriented to place and time.  Memory subjective described as intact.  There is a normal attention span & concentration ability. Speech is absent , no words are formed.non verbal repetition.   Mood and affect are aloof .  Cranial nerves: Pupils are equal and briskly reactive to light.  Funduscopic exam without evidence of pallor or edema.  Extraocular movements  in vertical and horizontal planes intact and without nystagmus.  Visual fields by finger perimetry are intact. Hearing to finger rub intact.  Facial sensation appers intact to fine touch. Facial motor strength is symmetric, but he moves more on the right. The tongue and uvula move midline.  He is incessantly moving. Tongue protrusion was not provided. He has well formed muscle bulk for the muscles of mastication.Shoulder shrug is normal.   Motor exam:   Sensory:  Fine touch, pinprick and vibration ; the patient cannot assist with this exam. Coordination: Rapid alternating movements in the fingers/hands were not tested due to autism, observation - he moves his left hand much less than his right and hazardous fisted hand he also places his thumb between the index and middle finger. He has a pronation at baseline also speaks for an early childhood or interpregnancy injury and follow for cerebral palsy. The left leg muscles have less bulk than the right especially at the thigh. His left quadriceps is smaller  than his right .Finger-to-nose maneuver can not be performed. Gait and station: Patient walks without assistive device and is able unassisted to climb up to the exam table.  Deep tendon reflexes: in the upper and lower extremities are asymmetric , left brisk with 2 beat clonus at ankle. Babinski maneuver response is down going right and intestable left.   Daril was evaluated in the emergency room on 09/10/2015 for a seizure he was started back on V Ross at times the generic name for Keppra 500 mg twice a day in the liquid form. His metabolic testing was normal his CBC and differential were normal. He was released home after a loading dose. His grandmother doesn't want him in a group home.  Plan:  Treatment plan and additional workup :  20 year old patient with birth history if severe prematurity., mother suffered  during pregnancy from meningitis - went into coma and died- he was delivered in [place] while his mother was on life support - born at just above 2 pounds weight.,  and raised [place] , [place].  No hospitalization since last visit. GThe patient is maintained on Risperdal which is treating mostly behavior issues - .  While he did have 2 presumed and unexplained seizures in  2015 , the seizure activity has progressed to more to 10  "seizures " in 2016 . I suspect spells of behavior origin. After each possible seizure [name] seemed to have lost some milestones.  He is less able to ambulate safely he seems to have frequent interruptions within activities-  even while he is eating-  that could be seizures staring attacks. He also had some tonic drop attacks and convulsive seizures. I would like for him to be established as a possible VNS candidate. We will then see if his seizure frequency is reduced or not and rely on his families report. His mother keeps a journal.  He was last seen for a seizure related postictal spell at the local emergency room on August 13-  2017.    Restarted on Keppra. 500 mg bid.    [facility]. Grandmother cannot arrange this , no transportation, he gets back to school. She cannot stay with him.    RV in 6 month with Np alternating with me.  45-60 minute visit slot needed.     CC: Dr Delila SpenceAngela Stanley.  Cc: Dr. Patric DykesNunkumar ,  Danella MaiersValerie Davis, RN at    [facility] Ron Beske MD 09/19/2015

## 2015-09-19 NOTE — Patient Instructions (Signed)
Levetiracetam Oral Solution What is this medicine? LEVETIRACETAM (lee ve tye RA se tam) is an antiepileptic drug. It is used with other medicines to treat certain types of seizures. This medicine may be used for other purposes; ask your health care provider or pharmacist if you have questions. What should I tell my health care provider before I take this medicine? They need to know if you have any of these conditions: -kidney disease -suicidal thoughts, plans, or attempt; a previous suicide attempt by you or a family member -an unusual or allergic reaction to levetiracetam, other medicines, foods, dyes, or preservatives -pregnant or trying to get pregnant -breast-feeding How should I use this medicine? Take this medicine by mouth. Follow the directions on the prescription label. Use a specially marked spoon or container to measure your medicine. Ask your pharmacist if you do not have one. Household spoons are not accurate. You may take this medicine with or without food. Take your doses at regular intervals. Do not take your medicine more often than directed. Do not stop taking this medicine except on the advice of your doctor or health care professional. Stopping your medicine suddenly can increase your seizures or their severity. A special MedGuide will be given to you by the pharmacist with each prescription and refill. Be sure to read this information carefully each time. Contact your pediatrician or health care professional regarding the use of this medication in children. While this drug may be prescribed for children as young as 1 month of age for selected conditions, precautions do apply. Overdosage: If you think you have taken too much of this medicine contact a poison control center or emergency room at once. NOTE: This medicine is only for you. Do not share this medicine with others. What if I miss a dose? If you miss a dose, take it as soon as you can. If it is almost time for your next  dose, take only that dose. Do not take double or extra doses. What may interact with this medicine? This medicine may interact with the following medications: -carbamazepine -colesevelam -probenecid -sevelamer This list may not describe all possible interactions. Give your health care provider a list of all the medicines, herbs, non-prescription drugs, or dietary supplements you use. Also tell them if you smoke, drink alcohol, or use illegal drugs. Some items may interact with your medicine. What should I watch for while using this medicine? Visit your doctor or health care professional for a regular check on your progress. Wear a medical identification bracelet or chain to say you have epilepsy, and carry a card that lists all your medications. It is important to take this medicine exactly as instructed by your health care professional. When first starting treatment, your dose may need to be adjusted. It may take weeks or months before your dose is stable. You should contact your doctor or health care professional if your seizures get worse or if you have any new types of seizures. You may get drowsy or dizzy. Do not drive, use machinery, or do anything that needs mental alertness until you know how this medicine affects you. Do not stand or sit up quickly, especially if you are an older patient. This reduces the risk of dizzy or fainting spells. Alcohol may interfere with the effect of this medicine. Avoid alcoholic drinks. The use of this medicine may increase the chance of suicidal thoughts or actions. Pay special attention to how you are responding while on this medicine. Any worsening of mood,  or thoughts of suicide or dying should be reported to your health care professional right away. Women who become pregnant while using this medicine may enroll in the [place] American Antiepileptic Drug Pregnancy Registry by calling 1-888-233-2334. This registry collects information about the safety of  antiepileptic drug use during pregnancy. What side effects may I notice from receiving this medicine? Side effects you should report to your doctor or health care professional as soon as possible: -allergic reactions like skin rash, itching or hives, swelling of the face, lips, or tongue -breathing problems -dark urine -general ill feeling or flu-like symptoms -problems with balance, talking, walking -unusually weak or tired -worsening of mood, thoughts or actions of suicide or dying -yellowing of the eyes or skin Side effects that usually do not require medical attention (report to your doctor or health care professional if they continue or are bothersome): -diarrhea -dizzy, drowsy -headache -loss of appetite This list may not describe all possible side effects. Call your doctor for medical advice about side effects. You may report side effects to FDA at 1-800-FDA-1088. Where should I keep my medicine? Keep out of reach of children. Store at room temperature between 15 and 30 degrees C (59 and 86 degrees F). Protect from heat and light. Throw away any unused medicine after the expiration date. NOTE: This sheet is a summary. It may not cover all possible information. If you have questions about this medicine, talk to your doctor, pharmacist, or health care provider.    2016, Elsevier/Gold Standard. (2012-12-08 08:42:06)  

## 2015-10-20 ENCOUNTER — Encounter: Payer: Self-pay | Admitting: Pediatrics

## 2015-10-20 ENCOUNTER — Ambulatory Visit (INDEPENDENT_AMBULATORY_CARE_PROVIDER_SITE_OTHER): Payer: Self-pay | Admitting: Pediatrics

## 2015-10-20 VITALS — BP 98/62 | Wt 114.2 lb

## 2015-10-20 DIAGNOSIS — Z0289 Encounter for other administrative examinations: Secondary | ICD-10-CM

## 2015-10-20 DIAGNOSIS — G40909 Epilepsy, unspecified, not intractable, without status epilepticus: Secondary | ICD-10-CM

## 2015-10-20 DIAGNOSIS — F840 Autistic disorder: Secondary | ICD-10-CM

## 2015-10-20 DIAGNOSIS — Z23 Encounter for immunization: Secondary | ICD-10-CM

## 2015-10-20 NOTE — Progress Notes (Signed)
Adolescent Well Care Visit Jonathan [name] is a 20 y.o. male who is here for well care. Jonathan [name] has autism with severe developmental delay and lack of verbal communication; he also has a seizure disorder.    PCP:  Maree Erie, MD   History was provided by his grandmother, Ms. Neven Fina.  He lives with her and she is his primary caretaker.  Current Issues: Current concerns include he is doing well.   She states he is currently not taking any seizure control medication because all medication tried had an adverse effect of excessive sedation for him and affected his balance. States she has also decided against assessment for VNS, stating admission at [facility] in [place] poses a hardship due to her being single caretaker and their lack of personal transportation (uses medicaid paid transportation to medical appointments). States she informed Dr. Vickey Huger (neurologist) of this; next neuro appt is in Feb 2018. GM states behavior is currently good and he has stopped the running, agitated spells that were posing a safety risk in public.  She voices desire to keep his medications at his current minimum.  Nutrition: Nutrition/Eating Behaviors: eats a good variety of foods Adequate calcium in diet?: yes Supplements/ Vitamins: none  Exercise/ Media: Play any Sports?/ Exercise: no Screen Time:  none Media Rules or Monitoring?: not applicable  Sleep:  Sleep: normally sleeps 11:30 pm to 6:25 am but has some nights when he awakens at about 5 am, eventually back to sleep.  GM states she stays in the room with him for safety purposes.  Social Screening: Lives with:  Paternal grandmother Parental relations:  good with grandmom; no significant involvement with father Activities, Work, and Regulatory affairs officer?: n/a Concerns regarding behavior with peers?  no Stressors of note: yes - grandmother struggles with demands of his care as she ages; gets agency support with Ms. Pat 4 days per week for 6-8 hours and has another  Geophysicist/field seismologist for the other 3 days (Sat thru Monday).  States they get him back to her just before bedtime. He never overnights away from home and GM states this is her preference due to concern others may not understand how he gets up/agitated some nights.  Education: School Name: [employer]; new male teacher this year - Mr. Lovell Sheehan  School Grade: special classroom setting for autism School performance: doing well; no concerns School Behavior: doing well; no concerns  Menstruation:   No LMP for male patient.  Confidentiality was discussed with the patient and, if applicable, with caregiver as well. Patient's personal or confidential phone number: n/a  Tobacco?  no Secondhand smoke exposure?  no Drugs/ETOH?  no  Sexually Active?  no   Pregnancy Prevention: abstinence  Safe at home, in school & in relationships?  Yes Safe to self?  Yes; pats ear with cupped hand a lot but no injury.  Screenings: Patient has a dental home: yes  Patient assessment questionnaires not appropriate.  Discussed nutrition, safety, sleep and family issues with grandmother as routine guidance.  Physical Exam:  Vitals:   10/20/15 0921  BP: 98/62  Weight: 114 lb 3.2 oz (51.8 kg)   BP 98/62   Wt 114 lb 3.2 oz (51.8 kg)   BMI 16.86 kg/m  Body mass index: body mass index is 16.86 kg/m. Growth percentile SmartLinks can only be used for patients less than 15 years old.   General Appearance:   slender, well appearing male. Follows grandmothers lead in walking, sitting and lying down. NAD.  HENT: Normocephalic, no obvious abnormality, conjunctiva clear  Mouth:   Normal appearing teeth with lots of silver caps.  Grinds teeth. No caries seen.  Neck:   Supple; thyroid: no enlargement, symmetric, no tenderness/mass/nodules  Chest Normal, thin male   Lungs:   Clear to auscultation bilaterally, normal work of breathing  Heart:   Regular rate and rhythm, S1 and S2 normal, no murmurs;    Abdomen:   Soft, non-tender, no mass, or organomegaly  GU normal male genitals, no testicular masses or hernia; Tanner 4  Musculoskeletal:   Tone and strength strong and symmetrical, all extremities. No contractures noted at large joints but walks on toes with equinus deformity               Lymphatic:   No cervical adenopathy  Skin/Hair/Nails:   Skin warm, dry and intact, no rashes, no bruises or petechiae  Neurologic:    Ambulates independently; periodically makes a soft grunting sound and tongues clicks; claps hands rhythmically     Assessment and Plan:   1. Encounter for other specified administrative purpose   2. Need for vaccination   3. Seizure disorder (HCC)   4. Autism spectrum disorder with accompanying intelllectual impairment, requiring very subtantial support (level 3)     BMI is not appropriate for age; inability to get his height today but he is chronically underweight, yet appears healthy at the low weight. Nutrition discussed.  Hearing screening result:not examined Vision screening result: not examined  Counseling provided for all of the vaccine components; grandmother voiced understanding and consent. Orders Placed This Encounter  Procedures  . Flu Vaccine QUAD 36+ mos IM   Continue regular dental care with Dr. Allison Quarryobb. Continue seizure management with Neurology, Dr. Vickey Hugerohmeier and associates. Follow-up prn; expect [facility] by timing of next wellness visit (aged-out of pediatrics).  Maree ErieStanley, Angela J, MD

## 2015-10-22 ENCOUNTER — Encounter: Payer: Self-pay | Admitting: Pediatrics

## 2015-10-22 NOTE — Patient Instructions (Signed)
[facility].

## 2015-11-09 ENCOUNTER — Other Ambulatory Visit: Payer: Self-pay | Admitting: Pediatrics

## 2015-11-09 DIAGNOSIS — F840 Autistic disorder: Secondary | ICD-10-CM

## 2015-12-05 ENCOUNTER — Other Ambulatory Visit: Payer: Self-pay | Admitting: Pediatrics

## 2015-12-05 DIAGNOSIS — J309 Allergic rhinitis, unspecified: Secondary | ICD-10-CM

## 2015-12-05 DIAGNOSIS — F840 Autistic disorder: Secondary | ICD-10-CM

## 2016-01-25 ENCOUNTER — Other Ambulatory Visit: Payer: Self-pay | Admitting: Pediatrics

## 2016-01-25 DIAGNOSIS — F840 Autistic disorder: Secondary | ICD-10-CM

## 2016-02-20 ENCOUNTER — Other Ambulatory Visit: Payer: Self-pay | Admitting: Pediatrics

## 2016-02-20 DIAGNOSIS — J309 Allergic rhinitis, unspecified: Secondary | ICD-10-CM

## 2016-03-26 ENCOUNTER — Encounter: Payer: Self-pay | Admitting: Adult Health

## 2016-03-26 ENCOUNTER — Ambulatory Visit (INDEPENDENT_AMBULATORY_CARE_PROVIDER_SITE_OTHER): Payer: Medicare Other | Admitting: Adult Health

## 2016-03-26 VITALS — BP 136/85 | HR 80 | Ht 69.0 in | Wt 116.4 lb

## 2016-03-26 DIAGNOSIS — R569 Unspecified convulsions: Secondary | ICD-10-CM

## 2016-03-26 DIAGNOSIS — F840 Autistic disorder: Secondary | ICD-10-CM

## 2016-03-26 NOTE — Progress Notes (Signed)
PATIENT: Jonathan [name] DOB: 05/03/1995  REASON FOR VISIT: follow up- autism, cerebral palsy, possible seizure events HISTORY FROM: patient  HISTORY OF PRESENT ILLNESS: Today 03/26/2016: Mr. Jonathan [name] is a 21 year old male with a history of autism, cerebral palsy and possible seizure events. He returns today for follow-up. In September 2017 the patient had what was thought to be a seizure event was taken to the emergency room. At that point he was placed on Keppra. Prior to this the patient was not on any seizure medication. At his last visit with Dr. Vickey Hugerohmeier it was felt that his described events worse possibly behavior related versus seizures. The patient's grandmother is his caregiver. She reports that since September 2017 he has not had any additional seizure events. She reports that she did wean him off of Keppra as it caused him to be zombielike. In the past he's had a prolonged EEG which was unremarkable. A video EEG was recommended however the patient [facility]. The patient was on Risperdal however the grandmother reports that she was unable to get a refill. She has not contacted his primary care. He returns today for an evaluation.    HISTORY 09/19/15:Jonathan [name] is a 21 y.o. male . seen here as a referral from PCP, Dr. Duffy RhodyStanley .   Jonathan [name] carries a diagnosis of autism with severe cognitive and intellectual delay. He had never had seizures until July 27 when his mother witnessed a generalized seizure. With an arrival time at the ER he developed a second seizure which was witnessed by staff. The first seizure at home lasted for probably less than a minute as tonic-clonic convulsions but he did not lose consciousness following the seizure he appeared postictal and very drowsy. The patient had a similar event was in 2 hours later in the emergency room witnessed by a PA. The on-call neurologist decided to give Her loading dose IV. The patient was brought  in for CT of the head which did not show any acute changes there was no evidence of a bleed, infection, swelling. Had not been back to baseline before the second seizure occurred so this would be considered a status epilepticus. The patient is usually noncommunicative and nonverbal due to his autism. His primary care physician is Dr. Delila SpenceAngela Stanley I asked his mother who was attending this visit if we can obtain appropriate previous diagnostic records and test results regarding Therin health. Apparently he was tested in [place] where he was born and diagnosed with cerebral palsy, later worked up for dysphagia and chronic constipation. The patient is very slender and appears younger than his American age.  His grandmother moved him  to the [place] area but he has [facility] , Dr Christance(?)and Dr. Glenna FellowsMichael Block, GI, and seen  in [place].  Jonathan DallasAlex Frenz had a dramatic birth history. His mother suffered from a sepsis during pregnancy and died after meningitis and she was brain dead but left on life support measures as she was pregnant Jonathan [name] was taken from the warm at gestational age the seventh month of pregnancy. His mother had been comatose since the fifth week of pregnancy. He weighted 2 pounds and 2 ounces and  fit into the hand of his father . He stayed for a long time in the NICU until released. At age [age] year he suffered a bout of pneumonia. He never became fully verbal he is usually nonverbal but produces sounds that he repeats  continuously. He learned crawling at 9-10 months ,walking at 14 months but was always tiptoeing. [name] has is baseline some visual impairment but I don't know how to grade this on a test ,given his low ability to cooperate with a vision test / visual field test.  History from 01-12-15 Since I have seen Mr. Currier last time about 15 months ago he has significantly changed in his ability to cognitively connect, follow guidance. He still  performs repetitively the same tasks and movements but he is completely nonverbal. September he was admitted to hospital after a bout of seizures and stayed for 3 days. He had multiple tests there was no infection found no inflammation no injury. His seizures were just attributed to his autism and underlying disorder. He did develop pneumonia after seizure probably aspiration related. In the meantime his family has noted that he is more rigid he is no longer able to climb stairs, or at least not reliably so. This did not used to be a problem last year. His right foot has slightly inverted and his right knee seems to be permanently flexed and not extended. He also has a much higher ankle tone. He performs repetitively movements with his right hand.  His appetite has not changed , he is a "good eater" according to mom. But he eats with long pauses, seems to stare off, unable to handle the food- he has begun to drool which was not the case last year either. He dropped frequently . This seems to encompass all seizure types seen in [place] Gestaut syndrome. He has staring attacks, drop attacks, convulsions. I demonstrated the recent MRI findings. Darren does have an abnormal corpus callosum, but I don't see any scar tissue in the subcortical areas. This cannot explain why his right foot is inverted unless he had some kind of more peripheral injury there is no evidence of stroke or abscess etc. What I would like to do is to consider [place] for a VNS stimulator. There is also [name] specific medication that he has not been placed on thus far. The patient is maintained on Risperdal which is treating mostly behavior issues - I am unsure if this medication can continue .  While he did have 2 unexplained seizures  2015 , the seizure activity has progressed since then more frequent and Flavius seems to have lost some milestones.  He is less able to ambulate safely he seems to have frequent interruptions within  activities-  even while he is eating-  that could be seizures staring attacks.  He also had some tonic drop attacks and convulsive seizures. I think it is necessary to change his medication to an O start specific medication and in addition I would like for him to be established as a possible VNS candidate. MRI reviewed.  - no bleed trauma or sepsis identified. However is underlying condition makes him prone to have epileptic seizures and seizures are more common in the autistic population as well as in the cerebral palsy population. Remains for now on Keppra. I will start FYCOMPA, at 2 mg liquid form given at bedtime once a day only. After 14 days I would like a call back abut his development on the medication- Shoukd he have still seizures I will  him to increase the dose from 2 mg to 4 mg nightly only.  We will then see if his seizure frequency is reduced or not and rely on his families report. His mother keeps a journal.  02-23-2015 Mr. Eoff is seen here today for a right scheduled follow-up visit. After our last visit 6 weeks ago he was started on file comp. He did well on file comp alone and seems not to have had more seizure activity but when he restarted or talk Risperdal this fight, he became very sedated, begun drooling, was more difficult to guide. His he was less ambulatory as well -almost a little bit" drugged". The in women's lives on an upstairs apartment and he could not manage to walk the stairs up. For this reason his primary care physician tried to reduce the Risperdal first seeing if he could tolerate the medication but this led to him being more aggressive and easily agitated. We have also discussed the implantation of a VNS stimulator. Since the VNS stimulator is not a medication and free of meatbolic side effects- and may be able to support seizure control even if Fycompa is only tolerated or fully effective at a lower dose.  Interval history from 05/24/2015. Loukas has  undergone and prolonged EEG study which showed no epileptiform activity only eyeblink and rhythmic muscle artifact from motion. At home his behaviors have escalated and grandma states that at times Ashar seems to be more impatient, easier agitated to some degree more hyper and  more difficult to guide. Anecdotally, she tells me that he has a new behavior pattern for example when they eat at a restaurant he has difficulties waiting for the food to arrive which didn't used to be a problem. Now he will sometimes try to take other people's food or knock off the plates. He is constantly tapping. He is scratching behind his right ear, he is also clapping more and more often more constantly. In other ways he seems to be under a lot more tension and impulse. Unfortunately he has also destroyed a few items of his grandmother's that were precious memories to her. This did not used to happen in the past and this behavior seems to be escalated since February. I am concerned that he does not have seizures as much as a behavior related problem.  Kydan will be 20 at this 1.st of June.I am very unsure now if he has seizures, and an EMU stay would be very hard for him.   Interval history from 09/19/2015, Jerelle is seen here today after having suffered multiple spells that were described as seizure activity and being evaluated in the hospital's emergency room without being admitted to hospital. He has been more agitated more ritualistic more perseverating recently producing a lot of nonverbal noises. He appears somewhat restless and under pressure. His grandmother is very overwhelmed.  ED NOTE: [place] a 20 y.o.malepresenting with a seizure. History is taken from the grandmother who is the primary caregiver. Patient has a history of mental retardation and is unable to provide any history. While she was getting him dressed today he had about a 2 minute seizure. Started with diffuse stiffening and neck extension and then  patient had diffuse shaking. Patient is currently back to his mental baseline. He is nonverbal at baseline. He has not been sick recently including no fevers, significant cough, urine changes, vomiting, diarrhea. Patient has a history of seizures, starting in the summer of 2016. Last seizure was in September 2016. Follows with neurology as an outpatient and had his Keppra weaned off in April 2017. Developed a foot drop last year, grandmother thinks is from the Keppra which is why the reasons why he was weaned off. he  Ed started him back on Keppra.     REVIEW OF SYSTEMS: Out of a complete 14 system review of symptoms, the patient complains only of the following symptoms, and all other reviewed systems are negative.  See history of present illness  ALLERGIES: No Known Allergies  HOME MEDICATIONS: Outpatient Medications Prior to Visit  Medication Sig Dispense Refill  . cetirizine (ZYRTEC) 1 MG/ML syrup GIVE 10ML'S BY MOUTH AT BEDTIME AS NEEDED FOR ALLERGY SYMPTOM CONTROL 240 mL 2  . ibuprofen (CHILD IBUPROFEN) 100 MG/5ML suspension Take 20 mLs (400 mg total) by mouth every 8 (eight) hours as needed for fever or mild pain. (Patient not taking: Reported on 10/20/2015) 473 mL 5  . levETIRAcetam (KEPPRA) 100 MG/ML solution Take 5 mLs (500 mg total) by mouth 2 (two) times daily. (Patient not taking: Reported on 10/20/2015) 473 mL 12  . risperiDONE (RISPERDAL) 1 MG/ML oral solution TAKE 0.5 MLS (0.5 MG TOTAL) BY MOUTH AT BEDTIME. FOR MANAGEMENT OF AGGITATION 30 mL 0   No facility-administered medications prior to visit.     PAST MEDICAL HISTORY: Past Medical History:  Diagnosis Date  . Allergic rhinitis   . Autism   . Mental retardation, idiopathic severe   . Pneumonia 06/2015  . Seizure (HCC) 08/24/2014    PAST SURGICAL HISTORY: Past Surgical History:  Procedure Laterality Date  . NO PAST SURGERIES      FAMILY HISTORY: Family History  Problem Relation Age of Onset  . Arthritis Paternal  Grandmother   . Obesity Other   . Stroke Other   . Hypertension Other   . Seizures Maternal Uncle     SOCIAL HISTORY: Social History   Social History  . Marital status: Single    Spouse name: N/A  . Number of children: N/A  . Years of education: N/A   Occupational History  . Not on file.   Social History Main Topics  . Smoking status: Never Smoker  . Smokeless tobacco: Never Used  . Alcohol use No  . Drug use: No  . Sexual activity: No   Other Topics Concern  . Not on file   Social History Narrative  . No narrative on file      PHYSICAL EXAM  Vitals:   03/26/16 1401  BP: 136/85  Pulse: 80  Weight: 116 lb 6.4 oz (52.8 kg)  Height: 5\' 9"  (1.753 m)   Body mass index is 17.19 kg/m.  Generalized: Well developed, in no acute distress   Neurological examination  Mentation: Alert. Does not follow commands. Makes sounds otherwise nonverbal Cranial nerve II-XII: Pupils were equal round reactive to light. Extraocular movements were full.  Head turning and shoulder shrug  were normal and symmetric. Motor: The motor testing reveals 5 over 5 strength of all 4 extremities. Good symmetric motor tone is noted throughout.   Sensory: unable to test Coordination: unable to test Gait and station: walks on his toes.  Reflexes: unable to test.   DIAGNOSTIC DATA (LABS, IMAGING, TESTING) - I reviewed patient records, labs, notes, testing and imaging myself where available.  Lab Results  Component Value Date   WBC 3.6 (L) 09/10/2015   HGB 14.1 09/10/2015   HCT 41.8 09/10/2015   MCV 81.6 09/10/2015   PLT 193 09/10/2015      Component Value Date/Time   NA 139 09/10/2015 1221   K 4.8 09/10/2015 1221   CL 107 09/10/2015 1221   CO2 21 (L) 09/10/2015 1221   GLUCOSE 98 09/10/2015 1221  BUN 8 09/10/2015 1221   CREATININE 0.77 09/10/2015 1221   CREATININE 0.64 03/17/2015 0954   CALCIUM 9.4 09/10/2015 1221   PROT 8.8 (H) 07/08/2015 1915   ALBUMIN 4.9 07/08/2015 1915    AST 30 07/08/2015 1915   ALT 22 07/08/2015 1915   ALKPHOS 93 07/08/2015 1915   BILITOT 0.6 07/08/2015 1915   GFRNONAA >60 09/10/2015 1221   GFRAA >60 09/10/2015 1221      ASSESSMENT AND PLAN 21 y.o. year old male  has a past medical history of Allergic rhinitis; Autism; Mental retardation, idiopathic severe; Pneumonia (06/2015); and Seizure (HCC) (08/24/2014). here with:  1. Seizures 2. Autism 3. Cerebral palsy  The patient has not been on seizure medication since September 2017. He has not had any seizure like events. For now we will not start patient on any new medication. If the patient has any seizure like events we will consider having video EEG done in the home as well as starting the patient on seizure medication. The patient's caregiver is amenable to this plan. Advised that if his symptoms worsen or he develops new symptoms he should let us know. He will follow-up in 6 months or sooner if needed.    Butch Penny, MSN, NP-C 03/26/2016, 2:26 PM [facility] [address] [place], [place] [address] [phone]

## 2016-03-26 NOTE — Progress Notes (Signed)
I agree with the assessment and plan as directed by NP .The patient is known to me .   Muneer Leider, MD  

## 2016-03-26 NOTE — Patient Instructions (Signed)
No seizure medication for now. If he has any seizure activity let us know If your symptoms worsen or you develop new symptoms please let us know.

## 2016-03-27 ENCOUNTER — Other Ambulatory Visit: Payer: Self-pay | Admitting: Pediatrics

## 2016-03-27 DIAGNOSIS — F840 Autistic disorder: Secondary | ICD-10-CM

## 2016-04-07 ENCOUNTER — Emergency Department (HOSPITAL_COMMUNITY)
Admission: EM | Admit: 2016-04-07 | Discharge: 2016-04-07 | Disposition: A | Discharge status: Home / Self Care | Payer: Medicare Other | Attending: Physician Assistant | Admitting: Physician Assistant

## 2016-04-07 ENCOUNTER — Encounter (HOSPITAL_COMMUNITY): Payer: Self-pay

## 2016-04-07 DIAGNOSIS — R569 Unspecified convulsions: Secondary | ICD-10-CM

## 2016-04-07 LAB — URINALYSIS, ROUTINE W REFLEX MICROSCOPIC
Bilirubin Urine: NEGATIVE
Glucose, UA: NEGATIVE mg/dL
Hgb urine dipstick: NEGATIVE
Ketones, ur: NEGATIVE mg/dL
LEUKOCYTES UA: NEGATIVE
Nitrite: NEGATIVE
PROTEIN: NEGATIVE mg/dL
SPECIFIC GRAVITY, URINE: 1.012 (ref 1.005–1.030)
pH: 8 (ref 5.0–8.0)

## 2016-04-07 LAB — CBC WITH DIFFERENTIAL/PLATELET
BASOS ABS: 0 10*3/uL (ref 0.0–0.1)
Basophils Relative: 1 %
EOS ABS: 0 10*3/uL (ref 0.0–0.7)
EOS PCT: 1 %
HCT: 43.2 % (ref 39.0–52.0)
HEMOGLOBIN: 14.8 g/dL (ref 13.0–17.0)
Lymphocytes Relative: 26 %
Lymphs Abs: 1 10*3/uL (ref 0.7–4.0)
MCH: 28.7 pg (ref 26.0–34.0)
MCHC: 34.3 g/dL (ref 30.0–36.0)
MCV: 83.7 fL (ref 78.0–100.0)
Monocytes Absolute: 0.3 10*3/uL (ref 0.1–1.0)
Monocytes Relative: 7 %
NEUTROS PCT: 65 %
Neutro Abs: 2.5 10*3/uL (ref 1.7–7.7)
PLATELETS: 171 10*3/uL (ref 150–400)
RBC: 5.16 MIL/uL (ref 4.22–5.81)
RDW: 14.3 % (ref 11.5–15.5)
WBC: 3.8 10*3/uL — AB (ref 4.0–10.5)

## 2016-04-07 LAB — COMPREHENSIVE METABOLIC PANEL
ALT: 16 U/L — ABNORMAL LOW (ref 17–63)
AST: 22 U/L (ref 15–41)
Albumin: 4.3 g/dL (ref 3.5–5.0)
Alkaline Phosphatase: 63 U/L (ref 38–126)
Anion gap: 10 (ref 5–15)
BILIRUBIN TOTAL: 0.6 mg/dL (ref 0.3–1.2)
BUN: 12 mg/dL (ref 6–20)
CO2: 27 mmol/L (ref 22–32)
Calcium: 9.4 mg/dL (ref 8.9–10.3)
Chloride: 104 mmol/L (ref 101–111)
Creatinine, Ser: 0.7 mg/dL (ref 0.61–1.24)
Glucose, Bld: 94 mg/dL (ref 65–99)
POTASSIUM: 4.1 mmol/L (ref 3.5–5.1)
Sodium: 141 mmol/L (ref 135–145)
TOTAL PROTEIN: 7.6 g/dL (ref 6.5–8.1)

## 2016-04-07 NOTE — ED Provider Notes (Signed)
[facility]PT Provider Note   CSN: [id number] Arrival date & time: 04/07/16  1516   History   Chief Complaint Chief Complaint  Patient presents with  . evaluate for seizure    HPI Jonathan [name] is a 21 y.o. male brought in by his grandmother who presents for evaluation of possible seizure activity. Overall has been doing very well. Patient is nonverbal at baseline but does walk on his own is unassisted. His grandmother is his caretaker. And states overall he has been doing very well. She never evaluated by neurology on 2/27 and they recommended a video EEG because seizure activity at this point has not been confirmed. She denies any recent fever, cough, vomiting, diarrhea. He has been eating and drinking well. Today he had about a 30 second episode of his neck turned to the side with his mouth open. He then was walking and was sliding against the wall. The grandmother says that this is unlike him and she became worried.  HPI  Past Medical History:  Diagnosis Date  . Allergic rhinitis   . Autism   . Mental retardation, idiopathic severe   . Pneumonia 06/2015  . Seizure (HCC) 08/24/2014    Patient Active Problem List   Diagnosis Date Noted  . Mental retardation   . Autism spectrum disorder with accompanying intelllectual impairment, requiring very subtantial support (level 3) 02/23/2015  . Seizure disorder, status epilepticus, convulsive (HCC) 09/01/2014  . BMI less than 19,adult 07/15/2013  . Allergic rhinitis, unspecified allergic rhinitis type 07/15/2013  . Constipation, unspecified constipation type 07/15/2013    Past Surgical History:  Procedure Laterality Date  . NO PAST SURGERIES         Home Medications    Prior to Admission medications   Medication Sig Start Date End Date Taking? Authorizing Provider  cetirizine (ZYRTEC) 1 MG/ML syrup GIVE 10ML'S BY MOUTH AT BEDTIME AS NEEDED FOR ALLERGY SYMPTOM CONTROL 02/20/16   Maree ErieAngela J Stanley, MD  ibuprofen (CHILD  IBUPROFEN) 100 MG/5ML suspension Take 20 mLs (400 mg total) by mouth every 8 (eight) hours as needed for fever or mild pain. 07/19/15   Cherece Griffith CitronNicole Grier, MD  levETIRAcetam (KEPPRA) 100 MG/ML solution Take 5 mLs (500 mg total) by mouth 2 (two) times daily. Patient not taking: Reported on 10/20/2015 09/19/15   Melvyn Novasarmen Dohmeier, MD  risperiDONE (RISPERDAL) 1 MG/ML oral solution TAKE 0.5 MLS (0.5 MG TOTAL) BY MOUTH AT BEDTIME. FOR MANAGEMENT OF AGGITATION 03/29/16   Maree ErieAngela J Stanley, MD    Family History Family History  Problem Relation Age of Onset  . Arthritis Paternal Grandmother   . Obesity Other   . Stroke Other   . Hypertension Other   . Seizures Maternal Uncle     Social History Social History  Substance Use Topics  . Smoking status: Never Smoker  . Smokeless tobacco: Never Used  . Alcohol use No     Allergies   Patient has no known allergies.   Review of Systems Review of Systems  Unable to perform ROS: Patient nonverbal     Physical Exam Updated Vital Signs BP 144/92 (BP Location: Right Arm)   Pulse 90   Temp 98.3 F (36.8 C) (Axillary)   Resp 18   SpO2 98%   Physical Exam  Constitutional: He is oriented to person, place, and time. He appears well-developed and well-nourished. No distress.  NAD. Nonverbal but is vocal   HENT:  Head: Normocephalic and atraumatic.  Eyes: Conjunctivae are normal. Pupils are  equal, round, and reactive to light. Right eye exhibits no discharge. Left eye exhibits no discharge. No scleral icterus.  Neck: Normal range of motion.  Cardiovascular: Normal rate and regular rhythm.  Exam reveals no gallop and no friction rub.   No murmur heard. Pulmonary/Chest: Effort normal and breath sounds normal. No respiratory distress. He has no wheezes. He has no rales. He exhibits no tenderness.  Abdominal: He exhibits no distension.  Neurological: He is alert and oriented to person, place, and time.  Unable to fully assess but he moves all  extremities. He is repeatedly tapping his right arm to his chest. He resists examination of eyes and is overall non-cooperative with exam  Skin: Skin is warm and dry.  Psychiatric: He has a normal mood and affect. His behavior is normal.  Nursing note and vitals reviewed.    ED Treatments / Results  Labs (all labs ordered are listed, but only abnormal results are displayed) Labs Reviewed  COMPREHENSIVE METABOLIC PANEL - Abnormal; Notable for the following:       Result Value   ALT 16 (*)    All other components within normal limits  CBC WITH DIFFERENTIAL/PLATELET - Abnormal; Notable for the following:    WBC 3.8 (*)    All other components within normal limits  URINALYSIS, ROUTINE W REFLEX MICROSCOPIC - Abnormal; Notable for the following:    Color, Urine STRAW (*)    All other components within normal limits    EKG  EKG Interpretation None       Radiology No results found.  Procedures Procedures (including critical care time)  Medications Ordered in ED Medications - No data to display   Initial Impression / Assessment and Plan / ED Course  I have reviewed the triage vital signs and the nursing notes.  Pertinent labs & imaging results that were available during my care of the patient were reviewed by me and considered in my medical decision making (see chart for details).  21 year old male presents with concern for possible seizure like activity. Described activity sounds very atypical for seizure but it's difficult to tell. He is mildly hypertensive but otherwise vitals are normal. Will obtain basic labs and urine.    Labs are normal. UA is clean. He has had no abnormal behavior here in ED. Shared visit with Dr. Corlis Leak. Will not restart any seizure meds at this point and defer to his neurologist. Advised grandmother to schedule hospital follow up and she verbalized understanding. Return precautions given.  Final Clinical Impressions(s) / ED Diagnoses   Final  diagnoses:  Seizure-like activity [facility])    New Prescriptions New Prescriptions   No medications on file     Bethel Born, PA-C 04/08/16 1558    Courteney Lyn Mackuen, MD 04/08/16 1610

## 2016-04-07 NOTE — Discharge Instructions (Signed)
Please follow up with neurology to have EEG set up and possibly restart seizure medicines Return for worsening symptoms

## 2016-04-07 NOTE — ED Triage Notes (Signed)
Caregiver brought patient to be evaluated for seizures. Has remote hx of seizures and off meds for 1 year. Caregiver states he turned head differently. On arrival at normal cognitive level, nonverbal. Patient tracks and alert. No jerking motions noted

## 2016-04-09 ENCOUNTER — Encounter (HOSPITAL_COMMUNITY): Payer: Self-pay | Admitting: Emergency Medicine

## 2016-04-09 ENCOUNTER — Emergency Department (HOSPITAL_COMMUNITY)
Admission: EM | Admit: 2016-04-09 | Discharge: 2016-04-09 | Disposition: A | Discharge status: Home / Self Care | Payer: Medicare Other | Attending: Emergency Medicine | Admitting: Emergency Medicine

## 2016-04-09 DIAGNOSIS — G253 Myoclonus: Secondary | ICD-10-CM | POA: Diagnosis not present

## 2016-04-09 DIAGNOSIS — R569 Unspecified convulsions: Secondary | ICD-10-CM | POA: Diagnosis not present

## 2016-04-09 DIAGNOSIS — F840 Autistic disorder: Secondary | ICD-10-CM | POA: Insufficient documentation

## 2016-04-09 NOTE — ED Triage Notes (Signed)
Per [facility], pt lives at him, dx with cerebral palsy, today pt caregiver was "not acting normal, focusing to the right, and his mouth looks twisted", mother states hes done it several times today. Pt did something similar last year with poss absent seizure activity. Pt went to neuro last month with no definitive dx. Pt in NAD.

## 2016-04-09 NOTE — ED Provider Notes (Signed)
[facility] Provider Note   CSN: [id number] Arrival date & time: 04/09/16  1648     History   Chief Complaint No chief complaint on file.   HPI Jonathan [name] is a 21 y.o. male.  HPI We will-year-old male with a history of cerebral palsy and reported seizures as of one year ago not currently on any antiepileptics presents to the ED with myoclonic jerking that started approximately one week ago after being restarted on Risperdal which she has been off of for approximately 2 months. The sounds are sporadic and lasts less than a few minutes. Family is able to break the jerking with verbal and physical stimuli. They deny any recent fevers, illnesses, infections. Any trauma.  Remainder of history, ROS, and physical exam limited due to patient's condition (mental delay). Additional information was obtained from family.   Level V Caveat.   Past Medical History:  Diagnosis Date  . Allergic rhinitis   . Autism   . Mental retardation, idiopathic severe   . Pneumonia 06/2015  . Seizure (HCC) 08/24/2014    Patient Active Problem List   Diagnosis Date Noted  . Mental retardation   . Autism spectrum disorder with accompanying intelllectual impairment, requiring very subtantial support (level 3) 02/23/2015  . Seizure disorder, status epilepticus, convulsive (HCC) 09/01/2014  . BMI less than 19,adult 07/15/2013  . Allergic rhinitis, unspecified allergic rhinitis type 07/15/2013  . Constipation, unspecified constipation type 07/15/2013    Past Surgical History:  Procedure Laterality Date  . NO PAST SURGERIES         Home Medications    Prior to Admission medications   Medication Sig Start Date End Date Taking? Authorizing Provider  cetirizine (ZYRTEC) 1 MG/ML syrup GIVE 10ML'S BY MOUTH AT BEDTIME AS NEEDED FOR ALLERGY SYMPTOM CONTROL 02/20/16  Yes Maree Erie, MD  ibuprofen (ADVIL,MOTRIN) 100 MG/5ML suspension Take 200 mg by mouth daily as needed for fever.  01/01/16  Yes  Historical Provider, MD  risperiDONE (RISPERDAL) 1 MG/ML oral solution TAKE 0.5 MLS (0.5 MG TOTAL) BY MOUTH AT BEDTIME. FOR MANAGEMENT OF AGGITATION Patient taking differently: TAKE 0.5 MLS (0.5 MG TOTAL) BY MOUTH AT BEDTIME. FOR MANAGEMENT OF AGITATION 03/29/16  Yes Maree Erie, MD    Family History Family History  Problem Relation Age of Onset  . Arthritis Paternal Grandmother   . Obesity Other   . Stroke Other   . Hypertension Other   . Seizures Maternal Uncle     Social History Social History  Substance Use Topics  . Smoking status: Never Smoker  . Smokeless tobacco: Never Used  . Alcohol use No     Allergies   Patient has no known allergies.   Review of Systems Review of Systems  Unable to perform ROS: Patient nonverbal     Physical Exam Updated Vital Signs BP 133/91   Pulse 95   Temp 98.5 F (36.9 C)   Resp 16   SpO2 100%   Physical Exam  Constitutional: He is oriented to person, place, and time. He appears well-developed and well-nourished. No distress.  HENT:  Head: Normocephalic and atraumatic.  Right Ear: External ear normal.  Left Ear: External ear normal.  Nose: Nose normal.  Mouth/Throat: Mucous membranes are normal. No trismus in the jaw.  Eyes: Conjunctivae and EOM are normal. No scleral icterus.  Neck: Normal range of motion and phonation normal.  Cardiovascular: Normal rate and regular rhythm.   Pulmonary/Chest: Effort normal. No stridor. No respiratory distress.  Abdominal: He exhibits no distension.  Musculoskeletal: Normal range of motion. He exhibits no edema.  Neurological: He is alert and oriented to person, place, and time.  Contractures of cerebral palsy in the lower and upper extremities (baseline). Patient sporadically turns head to the left side and has gnawing motion of his mouth. This is able to be broke with verbal and physical stimuli.  Skin: He is not diaphoretic.  Psychiatric: He has a normal mood and affect. His  behavior is normal.  Vitals reviewed.    ED Treatments / Results  Labs (all labs ordered are listed, but only abnormal results are displayed) Labs Reviewed - No data to display  EKG  EKG Interpretation None       Radiology No results found.  Procedures Procedures (including critical care time)  Medications Ordered in ED Medications - No data to display   Initial Impression / Assessment and Plan / ED Course  I have reviewed the triage vital signs and the nursing notes.  Pertinent labs & imaging results that were available during my care of the patient were reviewed by me and considered in my medical decision making (see chart for details).     Voluntary myoclonic jerking. Low suspicion for dystonic reaction due to Risperdal. Low suspicion for epilepsy. On reviewing the records patient was seen here 2 days ago for the same, and had unremarkable CBC, CMP and urinalysis. Do not feel that additional labs or imaging is warranted at this time. The family and caregiver were instructed to follow-up closely with a primary care provider who described the Risperdal. For discharge with strict return precautions.  Final Clinical Impressions(s) / ED Diagnoses   Final diagnoses:  Myoclonic jerking   Disposition: Discharge  Condition: Good  I have discussed the results, Dx and Tx plan with the patient's family who expressed understanding and agree(s) with the plan. Discharge instructions discussed at great length. The patient's family was given strict return precautions who verbalized understanding of the instructions. No further questions at time of discharge.    New Prescriptions   No medications on file    Follow Up: Maree ErieAngela J Stanley, MD [address] [place] [id number] [phone]  Schedule an appointment as soon as possible for a visit        Nira ConnPedro Eduardo Cardama, MD 04/09/16 1729

## 2016-04-11 ENCOUNTER — Telehealth: Payer: Self-pay

## 2016-04-11 NOTE — Telephone Encounter (Signed)
Grandmother requests recommendation for transfer of care to adult provider.

## 2016-04-13 NOTE — Telephone Encounter (Signed)
Sent message to referral coordinator to follow up on status of transition to adult care.

## 2016-04-15 ENCOUNTER — Telehealth: Payer: Self-pay | Admitting: Pediatrics

## 2016-04-15 NOTE — Telephone Encounter (Signed)
Reached Jonathan [name] today after earlier failed attempt but obviously awakened her, so kept conversation short.  Inquired on how [name] is doing and she stated ok, back at school.  I informed her we are still looking into transfer of care to adult medicine provider.  Will try to speak with her again later in the week.

## 2016-05-09 ENCOUNTER — Other Ambulatory Visit: Payer: Self-pay | Admitting: Pediatrics

## 2016-05-09 DIAGNOSIS — J309 Allergic rhinitis, unspecified: Secondary | ICD-10-CM

## 2016-05-09 DIAGNOSIS — R4701 Aphasia: Secondary | ICD-10-CM

## 2016-05-09 DIAGNOSIS — G40909 Epilepsy, unspecified, not intractable, without status epilepticus: Secondary | ICD-10-CM

## 2016-05-09 DIAGNOSIS — F840 Autistic disorder: Secondary | ICD-10-CM

## 2016-05-13 ENCOUNTER — Other Ambulatory Visit: Payer: Self-pay | Admitting: Pediatrics

## 2016-05-13 DIAGNOSIS — J309 Allergic rhinitis, unspecified: Secondary | ICD-10-CM

## 2016-05-17 ENCOUNTER — Other Ambulatory Visit: Payer: Self-pay | Admitting: Pediatrics

## 2016-05-17 DIAGNOSIS — F840 Autistic disorder: Secondary | ICD-10-CM

## 2016-05-17 NOTE — Telephone Encounter (Signed)
Called grandmother and inquired about tolerance due to [facility] having periodic seizures.  She stated he tolerates this well and it does not trigger seizure, "calms him down".  Informed GM still awaiting acceptance to adult medicine provider. Refill entered.

## 2016-05-17 NOTE — Telephone Encounter (Signed)
Awaiting information on transfer of care; releuctant to prescribe the risperdal due to seizure when he recently took the medication.  Will have grandmother contact Neurologist for alternative.

## 2016-05-20 NOTE — Telephone Encounter (Signed)
sppoke with Ms. Craun: she has not been giving risperidone since seizure in March; refill not needed.

## 2016-06-02 ENCOUNTER — Other Ambulatory Visit: Payer: Self-pay | Admitting: Pediatrics

## 2016-06-02 DIAGNOSIS — F840 Autistic disorder: Secondary | ICD-10-CM

## 2016-06-03 ENCOUNTER — Other Ambulatory Visit: Payer: Self-pay | Admitting: Pediatrics

## 2016-06-19 ENCOUNTER — Other Ambulatory Visit: Payer: Self-pay | Admitting: Pediatrics

## 2016-06-19 DIAGNOSIS — F840 Autistic disorder: Secondary | ICD-10-CM

## 2016-07-23 ENCOUNTER — Other Ambulatory Visit: Payer: Self-pay | Admitting: Pediatrics

## 2016-07-23 DIAGNOSIS — J309 Allergic rhinitis, unspecified: Secondary | ICD-10-CM

## 2016-08-07 DIAGNOSIS — L03213 Periorbital cellulitis: Secondary | ICD-10-CM | POA: Diagnosis not present

## 2016-08-07 DIAGNOSIS — K5900 Constipation, unspecified: Secondary | ICD-10-CM | POA: Diagnosis not present

## 2016-08-07 DIAGNOSIS — Z79899 Other long term (current) drug therapy: Secondary | ICD-10-CM | POA: Diagnosis not present

## 2016-08-08 ENCOUNTER — Ambulatory Visit: Payer: Medicare Other | Admitting: Family Medicine

## 2016-09-23 ENCOUNTER — Ambulatory Visit: Payer: Medicare Other | Admitting: Neurology

## 2016-10-09 DIAGNOSIS — Z0000 Encounter for general adult medical examination without abnormal findings: Secondary | ICD-10-CM | POA: Diagnosis not present

## 2016-10-09 DIAGNOSIS — Z23 Encounter for immunization: Secondary | ICD-10-CM | POA: Diagnosis not present

## 2016-11-27 ENCOUNTER — Ambulatory Visit: Payer: Medicare Other | Admitting: Neurology

## 2016-12-16 ENCOUNTER — Other Ambulatory Visit: Payer: Self-pay | Admitting: Pediatrics

## 2016-12-16 DIAGNOSIS — J309 Allergic rhinitis, unspecified: Secondary | ICD-10-CM

## 2016-12-16 NOTE — Telephone Encounter (Signed)
Called grandmother due to request for med refill.  GM states request was routed here in error.  He is now a [facility] with Dr. Caryn Beeakia Starkes-Perry; GM states she is pleased with the care [name] is receiving at that practice.

## 2016-12-23 ENCOUNTER — Ambulatory Visit (INDEPENDENT_AMBULATORY_CARE_PROVIDER_SITE_OTHER): Payer: Medicare Other | Admitting: Neurology

## 2016-12-23 ENCOUNTER — Encounter: Payer: Self-pay | Admitting: Neurology

## 2016-12-23 VITALS — BP 146/95 | HR 106 | Ht 69.0 in | Wt 121.0 lb

## 2016-12-23 DIAGNOSIS — R454 Irritability and anger: Secondary | ICD-10-CM

## 2016-12-23 DIAGNOSIS — F72 Severe intellectual disabilities: Secondary | ICD-10-CM | POA: Diagnosis not present

## 2016-12-23 DIAGNOSIS — F840 Autistic disorder: Secondary | ICD-10-CM

## 2016-12-23 NOTE — Progress Notes (Signed)
Provider:  Melvyn Novasarmen  Harvel Meskill, M D  Referring Provider: Maree ErieStanley, Angela J, MD Primary Care Physician:  Truman HaywardStarkes, Takia S, FNP  Chief Complaint  Patient presents with  . Follow-up    pt here with mom, pt says oct in 2nd week he had a sz lasting 58 sec. after 5 min he came back to.    HPI:  Jonathan [name] is a 21 y.o. african [place]  male with severe mental retardation, autism, severe disability. He is seen here with his grandmother , who raised him and is his main caretaker since early infancy.   PCP is not longer Dr. Duffy RhodyStanley, he is now followed by a resident at [facility]  Malachy Chamberakia Starkes, NP  .   I have the pleasure of seeing Jonathan [name] today in the presence of Mrs. [name] on 23 December 2016, Massai history is well known and described elsewhere in the chart. He is quite active today, rhythmically moving, clapping his hands tapping his feet and producing nonverbal sounds. I asked about seizure activity since last seen, and  his grandmother reports  in October 2018 there was a spell  that could have been a seizure. He came out of it okay, did not have any adjustments in medication, and has no lasting deficits.    Jonathan Dallaslex Klahr carries a diagnosis of autism with severe cognitive and intellectual delay. He had never had seizures before July 27 when his mother witnessed a generalized seizure. With an arrival time at the ER he developed a second seizure which was witnessed by staff. The first seizure at home lasted for probably less than a minute as tonic-clonic convulsions but he did not lose consciousness following the seizure he appeared postictal and very drowsy. The patient had a similar event was in 2 hours later in the emergency room witnessed by a PA. The on-call neurologist decided to give a loading dose IV. The patient was brought in for CT of the head which did not show any acute changes;  there was no evidence of a bleed, infection, swelling. Had not been back to baseline before the  second seizure occurred, so this would be considered a status epilepticus.The patient is usually noncommunicative and nonverbal due to his autism. His primary care physician is Dr. Delila SpenceAngela Stanley I asked his mother who was attending this visit if we can obtain appropriate previous diagnostic records and test results regarding Jonathan [name] health. Apparently he was tested in [place] where he was born and diagnosed with cerebral palsy, later worked up for dysphagia and chronic constipation. The patient is very slender and appears younger than his American age.  His grandmother [place] - but he has seen [facility] , Dr Christance(?)and Dr. Glenna FellowsMichael Block, GI, and had been seen  in [facility].  Jonathan DallasAlex Derenzo had a dramatic birth history. His mother suffered from a sepsis during pregnancy and died after meningitis and she was brain dead but left on life support measures as she was pregnant Jonathan [name] was taken from the womb at a gestational age of [redacted] weeks , in the the seventh month of pregnancy. His mother had been comatose since the fifth week of pregnancy. He weighted 2 pounds and 2 ounces and  fit into the hand of his father . He stayed for a long time in the NICU until released. At age [age] year he suffered a bout of pneumonia. He never became fully verbal he is usually nonverbal  but produces sounds that he repeats continuously. He learned crawling at 9-10 months ,walking at 14 months but was always tiptoeing. [name] has is baseline some visual impairment but I don't know how to grade this on a test ,given his low ability to cooperate with a vision test / visual field test.  History from 01-12-15 Since I have seen Mr. Chadderdon last time about 15 months ago he has significantly changed in his ability to cognitively connect, follow guidance. He still performs repetitively the same tasks and movements but he is completely nonverbal. September he was admitted to hospital after a  bout of seizures and stayed for 3 days. He had multiple tests there was no infection found no inflammation no injury. His seizures were just attributed to his autism and underlying disorder. He did develop pneumonia after seizure probably aspiration related. In the meantime his family has noted that he is more rigid- he is no longer able to climb stairs, or at least not reliably so.  This did not used to be a problem last year. His right foot has slightly inverted and his right knee seems to be permanently flexed and not extended. He also has a much higher ankle tone. He performs repetitively movements with his right hand.  His appetite has not changed , he is a "good eater" according to mom. But he eats with long pauses, seems to stare off, unable to handle the food- he has begun to drool which was not the case last year either. He dropped frequently . This seems to encompass all seizure types seen in [place] Gestaut syndrome. He has staring attacks, drop attacks, convulsions. I demonstrated the recent MRI findings. [name] does have an abnormal corpus callosum, but I don't see any scar tissue in the subcortical areas. This cannot explain why his right foot is inverted unless he had some kind of more peripheral injury there is no evidence of stroke or abscess etc. What I would like to do is to consider [name] for a VNS stimulator. There is also [name] specific medication that he has not been placed on thus far. The patient is maintained on Risperdal which is treating mostly behavior issues - I am unsure if this medication can continue .  While he did have 2 unexplained seizures  2015 , the seizure activity has progressed since then more frequent and Arvil seems to have lost some milestones.  He is less able to ambulate safely he seems to have frequent interruptions within activities-  even while he is eating-  that could be seizures staring attacks.  He also had some tonic drop attacks and convulsive  seizures. I think it is necessary to change his medication to an O start specific medication and in addition I would like for him to be established as a possible VNS candidate. MRI reviewed.  - no bleed trauma or sepsis identified. However is underlying condition makes him prone to have epileptic seizures and seizures are more common in the autistic population as well as in the cerebral palsy population. Remains for now on Keppra. I will start FYCOMPA, at 2 mg liquid form given at bedtime once a day only. After 14 days I would like a call back abut his development on the medication- Shoukd he have still seizures I will  him to increase the dose from 2 mg to 4 mg nightly only.  We will then see if his seizure frequency is reduced or not and rely on his families report. His  mother keeps a journal.     02-23-2015 Mr. Liberto is seen here today for a right scheduled follow-up visit. After our last visit 6 weeks ago he was started on file comp. He did well on file comp alone and seems not to have had more seizure activity but when he restarted or talk Risperdal this fight, he became very sedated, begun drooling, was more difficult to guide. His he was less ambulatory as well -almost a little bit" drugged". The in women's lives on an upstairs apartment and he could not manage to walk the stairs up. For this reason his primary care physician tried to reduce the Risperdal first seeing if he could tolerate the medication but this led to him being more aggressive and easily agitated. We have also discussed the implantation of a VNS stimulator. Since the VNS stimulator is not a medication and free of meatbolic side effects- and may be able to support seizure control even if Fycompa is only tolerated or fully effective at a lower dose.  Interval history from 05/24/2015. Susan has undergone and prolonged EEG study which showed no epileptiform activity only eyeblink and rhythmic muscle artifact from motion. At home his  behaviors have escalated and grandma states that at times Zamarian seems to be more impatient, easier agitated to some degree more hyper and  more difficult to guide. Anecdotally, she tells me that he has a new behavior pattern for example when they eat at a restaurant he has difficulties waiting for the food to arrive which didn't used to be a problem. Now he will sometimes try to take other people's food or knock off the plates. He is constantly tapping. He is scratching behind his right ear, he is also clapping more and more often more constantly. In other ways he seems to be under a lot more tension and impulse. Unfortunately he has also destroyed a few items of his grandmother's that were precious memories to her. This did not used to happen in the past and this behavior seems to be escalated since February. I am concerned that he does not have seizures as much as a behavior related problem.  Chi will be 20 at this 1.st of June.I am very unsure now if he has seizures, and an EMU stay would be very hard for him.   Interval history from 09/19/2015, Ladarious is seen here today after having suffered multiple spells that were described as seizure activity and being evaluated in the hospital's emergency room without being admitted to hospital. He has been more agitated more ritualistic more perseverating recently producing a lot of nonverbal noises. He appears somewhat restless and under pressure. His grandmother is very overwhelmed. ED Note : Manus Weedman is a 21 y.o. male presenting with a seizure. History is taken from the grandmother who is the primary caregiver. Patient has a history of mental retardation and is unable to provide any history. While she was getting him dressed today he had about a 2 minute seizure. Started with diffuse stiffening and neck extension and then patient had diffuse shaking. Patient is currently back to his mental baseline. He is nonverbal at baseline. He has not been sick recently  including no fevers, significant cough, urine changes, vomiting, diarrhea. Patient has a history of seizures, starting in the summer of 2016. Last seizure was in September 2016. Follows with neurology as an outpatient and had his Keppra weaned off in April 2017. Developed a foot drop last year, grandmother thinks is from the Keppra  which is why the reasons why he was weaned off. The Ed started him back on Keppra.   Review of Systems: Out of a complete 14 system review, the patient complains of only the following symptoms, and all other reviewed systems are negative.;  Born with developmental deficits. autism , non verbal, MRDD - very agitated but not physically threatening.   Past Medical History:  Diagnosis Date  . Allergic rhinitis   . Autism   . Mental retardation, idiopathic severe   . Pneumonia 06/2015  . Seizure (HCC) 08/24/2014    Past Surgical History:  Procedure Laterality Date  . NO PAST SURGERIES      Current Outpatient Medications  Medication Sig Dispense Refill  . cetirizine (ZYRTEC) 1 MG/ML syrup GIVE 10ML'S BY MOUTH AT BEDTIME AS NEEDED FOR ALLERGY SYMPTOM CONTROL 240 mL 1  . ibuprofen (ADVIL,MOTRIN) 100 MG/5ML suspension Take 200 mg by mouth daily as needed for fever.      No current facility-administered medications for this visit.     Allergies as of 12/23/2016  . (No Known Allergies)    Vitals: BP (!) 146/95   Pulse (!) 106   Ht 5\' 9"  (1.753 m)   Wt 121 lb (54.9 kg)   BMI 17.87 kg/m  Last Weight:  Wt Readings from Last 1 Encounters:  12/23/16 121 lb (54.9 kg)   Last Height:   Ht Readings from Last 1 Encounters:  12/23/16 5\' 9"  (1.753 m)    Physical exam:  General: The patient is awake, alert and appears not in acute distress. The patient is well groomed. Head: Normocephalic, atraumatic. Neck is supple. Mallampati 2, neck circumference:13  Cardiovascular:  Regular rate and rhythm , without  murmurs or carotid bruit, and without distended neck  veins. Respiratory: Lungs are clear to auscultation. Skin:  Without evidence of edema, or rash Trunk: BMI is normal posture.  Neurologic exam : The patient is awake and alert, oriented to place and time.  Memory subjective described as intact.  There is a normal attention span & concentration ability. Speech is absent , no words are formed.non verbal repetition.   Mood and affect are aloof .  Cranial nerves: Pupils are equal and briskly reactive to light. Funduscopic exam had to be deferred today, 12-23-2016 . Extraocular movements  in vertical and horizontal planes appeared intact - no nystagmus.  Visual fields by finger perimetry are intact. Hearing to finger rub is probable intact- he cannot follow commands.  Facial sensation appers intact to fine touch. Facial motor strength is symmetric, but he moves more on the right. He is incessantly moving- tapping and vocalizing He has well formed muscle bulk for the muscles of mastication. Shoulder shrug is normal.   Motor exam:   Sensory:  Fine touch, pinprick and vibration ; the patient cannot assist with this exam. Coordination: Rapid alternating movements in the fingers/hands were not tested due to autism, observation - he moves his left hand much less than his right and hazardous fisted hand he also places his thumb between the index and middle finger. He has a pronation at baseline also speaks for an early childhood or interpregnancy injury and follow for cerebral palsy.  The left leg muscles have less bulk than the right especially at the thigh. His left quadriceps is smaller  than his right .Finger-to-nose maneuver can not be performed. Gait and station: Patient walks without assistive device and is able unassisted to climb up to the exam table.  Deep tendon reflexes:  in the upper and lower extremities are asymmetric , left brisk with 2 beat clonus at ankle. Babinski maneuver response is down going right and deferred left.      Plan:   Treatment plan and additional workup :  21 year old african [place] male - a patient with a dramatic birth history of severe prematurity., mother suffered during pregnancy from meningitis - went into coma and died- he was delivered in [place] while his mother was on life support - born at just above 2 pounds weight.  and raised by Grandmother from [place] , [place].  No hospitalization since last visit. No anti-epiletica for this patient . No longer on risperdal after he developed side effects. But he is difficult to control for grandmother-  Only Motrin, and anti- histamine.  He had one more seizure in 10-2016,  Recovered without medical intervention.   We have given up on the [facility]. Grandmother cannot arrange this , no transportation, he gets back to school. She cannot stay with him.    RV in 12 month with NP alternating with me.   The patient needs a calm environment, is easily overstimulated.  45-60 minute visit slot needed.     Cc: Dr. Patric DykesNunkumar ,  Danella MaiersValerie Davis, RN Social Worker:  "Miss Doreatha MartinPatricia "   Ayris Carano MD 12/23/2016

## 2016-12-23 NOTE — Patient Instructions (Signed)
Autism Spectrum Disorder, Adult Autism spectrum disorder (ASD) is a group of developmental disorders that affect communication, social interactions, and behavior. The disorders start in early childhood and continue throughout life. ASD affects each person differently. Some people with ASD have above-average intelligence. Others have severe intellectual disabilities. Some people can do most basic activities or learn to do them. Others require a lot of assistance. What are the causes? The exact cause of this condition is not known. Most experts believe that ASD is caused by genes that are passed down through families. What increases the risk? This condition is more likely to develop in people who:  Are male.  Have a family history of the condition.  Were born before 26 weeks of pregnancy (prematurely).  Were born with another genetic disorder.  Were conceived when their parents were older than 6935-22 years of age.  Were exposed to a seizure medicine called valproic acid while in their mother's womb.  What are the signs or symptoms? Symptoms of this condition include:  Not interacting with other people.  Poor eye contact.  Inappropriate facial expressions.  Trouble making friends.  Repetitive movements, such as hand flapping, rocking back and forth, or head movements.  Arranging items in an order.  Echoing what other people say (echolalia).  Always wanting things to be the same. You may want to eat the same foods, take the same route to school or work, or follow the same order of activities each day.  Being completely focused on an object or topic of interest.  Unusually strong or mild response when experiencing certain things, such as sounds, pain, extreme temperatures, certain textures, or scents.  Some people with ASD also have learning problems, depression, anxiety, or seizures. How is this diagnosed? This condition is diagnosed with a comprehensive assessment. You may  need to see a team of health care providers, which may include:  A psychologist or psychiatrist.  A speech and language therapist.  A neurologist.  Your health care providers will assess your behavior and development. They will determine whether you have level 1, level 2, or level 3 ASD. Level 1 ASD is the mildest form of the condition. With treatment, this form may not be noticeable. If you have this form, you may:  Speak in full sentences.  Have no repetitive behaviors.  Have trouble starting interactions or friendships with others.  Have trouble switching between two or more activities.  Level 2 ASD is a moderate form of the condition. If you have this form, you may:  Speak in simple sentences.  Repeat certain behaviors, which interferes with daily activities from time to time.  Only interact with others about specific, shared interests.  Have trouble coping with change.  Have unusual nonverbal communication skills.  Level 3 ASD is the most severe form of the condition. This form interferes with daily life. If you have this form, you may:  Speak rarely or use very few understandable words.  Repeat certain behaviors often, which gets in the way of daily activities.  Interact with others awkwardly and not very often.  Have extreme difficulty coping with change.  How is this treated? There is no cure for this condition, but treatment can make symptoms less severe. A team of health care providers will design a treatment program to meet your needs. Treatment usually involves a combination of therapies that address the following:  Social skills.  Language and communication.  Behavior.  Skills for daily living.  Movement and coordination.  Sometimes medicines are prescribed to treat depression and anxiety, seizures, or certain behavioral problems. Training and support for your family can also be part of your treatment program. Follow these instructions at  home:  Learn as much as you can about ASD. Make sure you understand your condition.  Work closely with your health care providers.  Take over-the-counter and prescription medicines only as told by your health care provider.  Check with your health care provider before taking any new medicines.  Keep all follow-up visits as told by your team of health care providers. This is important. Contact a health care provider if:  You have new symptoms.  Your symptoms get worse or do not respond to treatment.  You are behaving in ways that harm you or others.  You develop convulsions. Signs of convulsions include: ? Jerking and twitching. ? Sudden falls for no reason. ? Lack of response. ? Dazed behavior for brief periods. ? Staring. ? Rapid blinking. ? Unusual sleepiness. ? Irritability when waking.  You become depressed. Signs of depression include: ? Unusual sadness. ? Decreased appetite. ? Weight loss. ? Lack of interest in things that are normally enjoyed. ? Trouble sleeping.  You become anxious. Signs of anxiety include: ? Worrying a lot. ? Restlessness. ? Irritability. ? Trembling. ? Trouble sleeping. This information is not intended to replace advice given to you by your health care provider. Make sure you discuss any questions you have with your health care provider. Document Released: 10/06/2001 Document Revised: 09/13/2015 Document Reviewed: 08/21/2015 Elsevier Interactive Patient Education  [employer]2018 [employer]

## 2017-05-11 ENCOUNTER — Encounter (HOSPITAL_COMMUNITY): Payer: Self-pay | Admitting: *Deleted

## 2017-05-11 ENCOUNTER — Emergency Department (HOSPITAL_COMMUNITY)
Admission: EM | Admit: 2017-05-11 | Discharge: 2017-05-11 | Disposition: A | Discharge status: Home / Self Care | Payer: Medicare Other | Attending: Emergency Medicine | Admitting: Emergency Medicine

## 2017-05-11 ENCOUNTER — Other Ambulatory Visit: Payer: Self-pay

## 2017-05-11 ENCOUNTER — Emergency Department (HOSPITAL_COMMUNITY): Payer: Medicare Other

## 2017-05-11 DIAGNOSIS — F840 Autistic disorder: Secondary | ICD-10-CM | POA: Diagnosis not present

## 2017-05-11 DIAGNOSIS — F72 Severe intellectual disabilities: Secondary | ICD-10-CM | POA: Diagnosis not present

## 2017-05-11 DIAGNOSIS — R569 Unspecified convulsions: Secondary | ICD-10-CM | POA: Diagnosis not present

## 2017-05-11 DIAGNOSIS — R402441 Other coma, without documented Glasgow coma scale score, or with partial score reported, in the field [EMT or ambulance]: Secondary | ICD-10-CM | POA: Diagnosis not present

## 2017-05-11 DIAGNOSIS — G40909 Epilepsy, unspecified, not intractable, without status epilepticus: Secondary | ICD-10-CM | POA: Diagnosis not present

## 2017-05-11 LAB — CBC WITH DIFFERENTIAL/PLATELET
Basophils Absolute: 0 10*3/uL (ref 0.0–0.1)
Basophils Relative: 1 %
EOS ABS: 0 10*3/uL (ref 0.0–0.7)
EOS PCT: 1 %
HCT: 43 % (ref 39.0–52.0)
Hemoglobin: 14.8 g/dL (ref 13.0–17.0)
Lymphocytes Relative: 17 %
Lymphs Abs: 0.7 10*3/uL (ref 0.7–4.0)
MCH: 28.3 pg (ref 26.0–34.0)
MCHC: 34.4 g/dL (ref 30.0–36.0)
MCV: 82.2 fL (ref 78.0–100.0)
MONO ABS: 0.3 10*3/uL (ref 0.1–1.0)
MONOS PCT: 7 %
Neutro Abs: 3 10*3/uL (ref 1.7–7.7)
Neutrophils Relative %: 74 %
PLATELETS: 201 10*3/uL (ref 150–400)
RBC: 5.23 MIL/uL (ref 4.22–5.81)
RDW: 14.3 % (ref 11.5–15.5)
WBC: 4 10*3/uL (ref 4.0–10.5)

## 2017-05-11 LAB — COMPREHENSIVE METABOLIC PANEL
ALT: 18 U/L (ref 17–63)
ANION GAP: 11 (ref 5–15)
AST: 21 U/L (ref 15–41)
Albumin: 4.4 g/dL (ref 3.5–5.0)
Alkaline Phosphatase: 64 U/L (ref 38–126)
BUN: 12 mg/dL (ref 6–20)
CALCIUM: 9.4 mg/dL (ref 8.9–10.3)
CHLORIDE: 103 mmol/L (ref 101–111)
CO2: 24 mmol/L (ref 22–32)
Creatinine, Ser: 0.73 mg/dL (ref 0.61–1.24)
Glucose, Bld: 73 mg/dL (ref 65–99)
Potassium: 4 mmol/L (ref 3.5–5.1)
SODIUM: 138 mmol/L (ref 135–145)
Total Bilirubin: 0.8 mg/dL (ref 0.3–1.2)
Total Protein: 8 g/dL (ref 6.5–8.1)

## 2017-05-11 LAB — MAGNESIUM: MAGNESIUM: 2.3 mg/dL (ref 1.7–2.4)

## 2017-05-11 MED ORDER — LEVETIRACETAM IN NACL 1000 MG/100ML IV SOLN: 1000.0000 mg | Freq: Once | INTRAVENOUS | Status: DC

## 2017-05-11 MED ORDER — DIAZEPAM 10 MG RE GEL: 10.0000 mg | Freq: Once | RECTAL | 0 refills | Status: DC

## 2017-05-11 MED ORDER — LORAZEPAM 2 MG/ML IJ SOLN
1.0000 mg | Freq: Once | INTRAMUSCULAR | Status: AC
Start: 2017-05-11 — End: 2017-05-11
  Administered 2017-05-11: 1 mg via INTRAVENOUS
  Filled 2017-05-11: qty 1

## 2017-05-11 MED ORDER — LEVETIRACETAM IN NACL 500 MG/100ML IV SOLN
500.0000 mg | Freq: Once | INTRAVENOUS | Status: AC
  Administered 2017-05-11: 500 mg via INTRAVENOUS
  Filled 2017-05-11: qty 100

## 2017-05-11 MED ORDER — SODIUM CHLORIDE 0.9 % IV BOLUS
1000.0000 mL | Freq: Once | INTRAVENOUS | Status: AC
  Administered 2017-05-11: 1000 mL via INTRAVENOUS

## 2017-05-11 NOTE — ED Notes (Signed)
Patient transported to X-ray 

## 2017-05-11 NOTE — ED Provider Notes (Signed)
[facility]-EMERGENCY DEPT Provider Note   CSN: [id number] Arrival date & time: 05/11/17  1707     History   Chief Complaint Chief Complaint  Patient presents with  . Seizures    HPI Jonathan [name] is a 22 y.o. male.  HPI 22 year old male with history of seizure disorder, CP, MR, here with witnessed seizure.  History is limited secondary to patient's MR.  No family at bedside on arrival.  Per report, had a witnessed, generalized, tonic-clonic seizure.  He is reportedly recently taken off his seizure medicines for unknown reason.  No known tongue biting.  Patient did lose urinary continence.  Level 5 caveat invoked as remainder of history, ROS, and physical exam limited due to patient's MR/mental status.  On further discussion with family, the patient reportedly began staring off.  He then went into a generalized, tonic-clonic seizure.  This lasted less than 5 minutes.  He then was postictal but was back to his baseline prior to EMS arrival.  Patient had been himself prior to the incident.   Past Medical History:  Diagnosis Date  . Allergic rhinitis   . Autism   . Mental retardation, idiopathic severe   . Pneumonia 06/2015  . Seizure (HCC) 08/24/2014    Patient Active Problem List   Diagnosis Date Noted  . Behavior-irritability 12/23/2016  . Mental retardation   . Autism spectrum disorder with accompanying intelllectual impairment, requiring very subtantial support (level 3) 02/23/2015  . Seizure disorder, status epilepticus, convulsive (HCC) 09/01/2014  . BMI less than 19,adult 07/15/2013  . Allergic rhinitis 07/15/2013  . Constipation, unspecified constipation type 07/15/2013    Past Surgical History:  Procedure Laterality Date  . NO PAST SURGERIES          Home Medications    Prior to Admission medications   Medication Sig Start Date End Date Taking? Authorizing Provider  cetirizine (ZYRTEC) 1 MG/ML syrup GIVE 10ML'S BY MOUTH AT BEDTIME AS  NEEDED FOR ALLERGY SYMPTOM CONTROL 05/17/16  Yes Maree Erie, MD  ibuprofen (ADVIL,MOTRIN) 100 MG/5ML suspension Take 200 mg by mouth daily as needed for fever.  01/01/16  Yes [provider]  diazepam (DIASTAT ACUDIAL) 10 MG GEL Place 10 mg rectally once for 1 dose. 05/11/17 05/11/17  Shaune Pollack, MD    Family History Family History  Problem Relation Age of Onset  . Arthritis Paternal Grandmother   . Obesity Other   . Stroke Other   . Hypertension Other   . Seizures Maternal Uncle     Social History Social History   Tobacco Use  . Smoking status: Never Smoker  . Smokeless tobacco: Never Used  Substance Use Topics  . Alcohol use: No  . Drug use: No     Allergies   Patient has no known allergies.   Review of Systems Review of Systems  Unable to perform ROS: Patient nonverbal     Physical Exam Updated Vital Signs BP 120/78   Pulse (!) 105   Temp 98.8 F (37.1 C) (Oral)   Resp 20   SpO2 95%   Physical Exam  Constitutional: He appears well-developed and well-nourished. No distress.  HENT:  Head: Normocephalic and atraumatic.  Eyes: Conjunctivae are normal.  Neck: Neck supple.  Cardiovascular: Normal rate, regular rhythm and normal heart sounds. Exam reveals no friction rub.  No murmur heard. Pulmonary/Chest: Effort normal and breath sounds normal. No respiratory distress. He has no wheezes. He has no rales.  Abdominal: He exhibits no  distension.  Musculoskeletal: He exhibits no edema.  Neurological:  Nonverbal.  Moves all extremities.  Face appears symmetric.  When attempting to shine light in I, patient closed his eyes briskly and moves away from examiner.  Nonfocal at this time.  No ongoing seizure-like activity  Skin: Skin is warm. Capillary refill takes less than 2 seconds.  Psychiatric: He has a normal mood and affect.  Nursing note and vitals reviewed.    ED Treatments / Results  Labs (all labs ordered are listed, but only abnormal  results are displayed) Labs Reviewed  URINE CULTURE  CBC WITH DIFFERENTIAL/PLATELET  COMPREHENSIVE METABOLIC PANEL  MAGNESIUM    EKG EKG Interpretation  Date/Time:  "Sunday May 11 2017 17:29:36 EDT Ventricular Rate:  86 PR Interval:    QRS Duration: 86 QT Interval:  362 QTC Calculation: 433 R Axis:   74 Text Interpretation:  Sinus rhythm Borderline repolarization abnormality No significant change since last tracing Confirmed by Shambhavi Salley ([id number]) on 05/11/2017 5:32:08 PM   Radiology Dg Chest 2 View  Result Date: 05/11/2017 CLINICAL DATA:  Seizure. EXAM: CHEST - 2 VIEW COMPARISON:  July 08, 2015 FINDINGS: Study is limited due to patient rotation. Within this limitation, the heart, hila, mediastinum, lungs, and pleura are normal. No acute abnormalities. IMPRESSION: No active cardiopulmonary disease. Electronically Signed   By: David  Williams III M.D   On: 05/11/2017 17:53    Procedures Procedures (including critical care time)  Medications Ordered in ED Medications  LORazepam (ATIVAN) injection 1 mg (1 mg Intravenous Given 05/11/17 1810)  levETIRAcetam (KEPPRA) IVPB 500 mg/100 mL premix (0 mg Intravenous Stopped 05/11/17 2216)  sodium chloride 0.9 % bolus 1,000 mL (0 mLs Intravenous Stopped 05/11/17 2305)     Initial Impression / Assessment and Plan / ED Course  I have reviewed the triage vital signs and the nursing notes.  Pertinent labs & imaging results that were available during my care of the patient were reviewed by me and considered in my medical decision making (see chart for details).     21 yo M here with breakthrough seizure, likely 2/2 stopping his AEDs. He was in his usual state of health prior to seizure activity. Lab work is very reassuring. CXR clear. He is back to baseline now, though slightly drowsy 2/2 ativan. EKG non-ischemic, normal intervals. Discussed care plan with caregiver, grandmother. Unable to obtain UA 2/2 poor sample but I've sent a UCx, he  has no urinary sx, and no change in urinary habits per family - doubt UTI but will f/u Cx. I called lab directly to request UCx be sent, and they are aware/report that it is running. No signs of pyelo. Will give him a dose of keppra here, as he was on this previously. However, I'm hesitant to start this as he reportedly had some side effects with long-term use. It was effective for him, however, so will give dose here. I've asked grandmother and caregiver to call Neurologist first thing in AM to discuss possibly initiating an AED at this time. Family in agreement. Return precautions given.  Final Clinical Impressions(s) / ED Diagnoses   Final diagnoses:  Seizure (HCC)    ED Discharge Orders        Ordered    diazepam (DIASTAT ACUDIAL) 10 MG GEL   Once     04" /14/19 2123       Shaune PollackIsaacs, Malyia Moro, MD 05/12/17 [phone]

## 2017-05-11 NOTE — Discharge Instructions (Addendum)
As we talked about, I've given Jonathan [name] a dose of anti-seizure medicine for tonight. This should hopefully help prevent any additional seizures tonight.  We've sent a urine sample to make sure he doesn't have a urine infection - this will come back in the next 2-3 days and we will contact you. If [name] gets fevers or other concerning symptoms, return to the ER.  CALL YOUR NEUROLOGIST FIRST THING IN THE MORNING. It is very important to let them know that you have been seen in the ER and given a one-time dose of Keppra.

## 2017-05-11 NOTE — ED Triage Notes (Signed)
Pt bib EMS and presents with a seizure that was witnessed by the caregiver.  The seizure was a full body shake that lasted about 5 to six minutes.  Pt has a history of seizures but has not had one since 2017 nor has he been on any medications.

## 2017-05-11 NOTE — ED Notes (Signed)
Bed: [id number] Expected date:  Expected time:  Means of arrival:  Comments: 22 yo seizure

## 2017-05-14 LAB — URINE CULTURE

## 2017-06-21 IMAGING — MR MR HEAD WO/W CM
9 of 13 series · 33 of 48 positions shown · IV contrast (multihance)
Comparison: Head CT same day and 06/27/2013.

CLINICAL DATA: Personal history of autism with no onset generalized
seizure, tonic clonic.

EXAM:
MRI HEAD WITHOUT AND WITH CONTRAST
TECHNIQUE: Multiplanar, multiecho pulse sequences of the brain and surrounding
structures were obtained without and with intravenous contrast.
CONTRAST:  10mL MULTIHANCE GADOBENATE DIMEGLUMINE 529 MG/ML IV SOLN

[Series 3: DWI · axial · 3.0mm · 1.09mm/px · z∈[-115,+5]mm · 8 of 84 slices shown (1 of 4)]
[im 1/84]
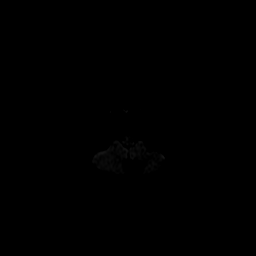
[im 12/84]
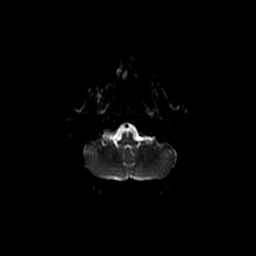
[im 24/84]
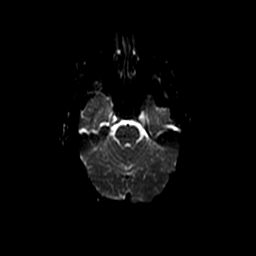
[im 36/84]
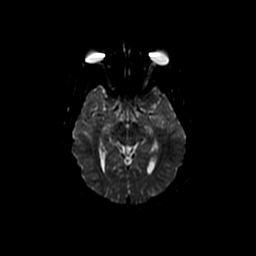
[im 48/84]
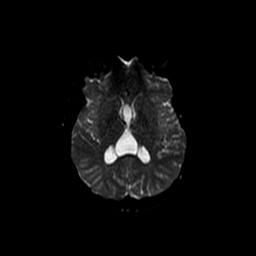
[im 60/84]
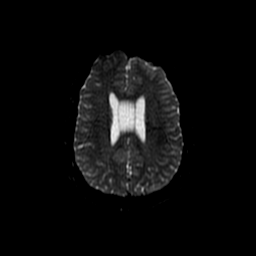
[im 72/84]
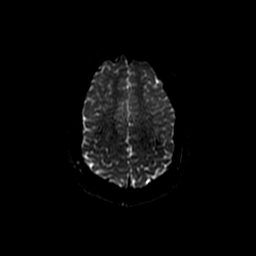
[im 84/84]
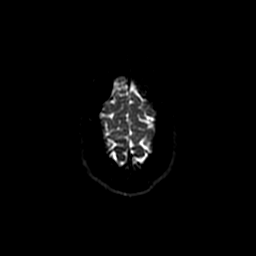

[Series 4: FLAIR · axial · 5.0mm · 0.43mm/px · z∈[-111,+30]mm · 2 of 25 slices shown]
[im 1/25]
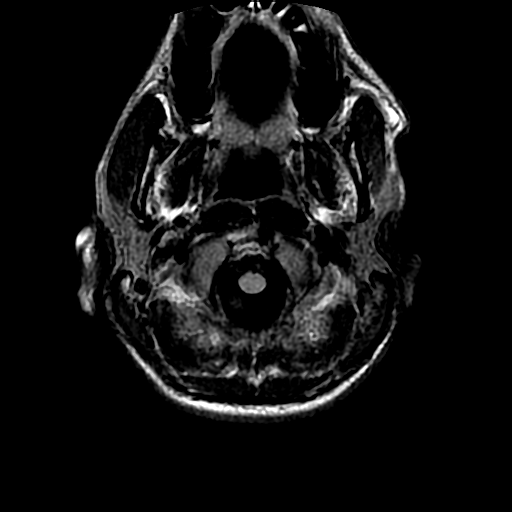
[im 25/25]
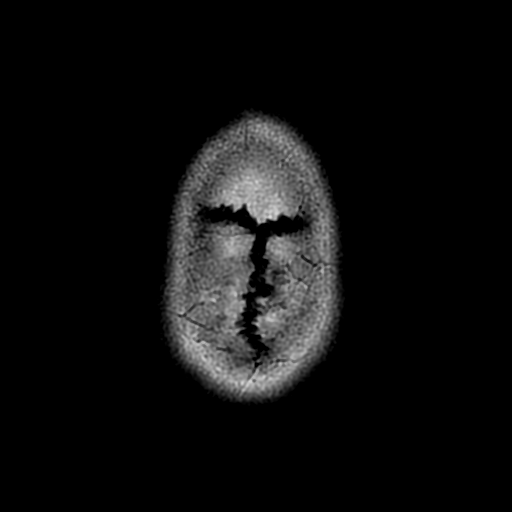

[Series 5: DWI · coronal · 5.0mm · 1.09mm/px · 6 of 66 slices shown (2 of 4)]
[im 1/66]
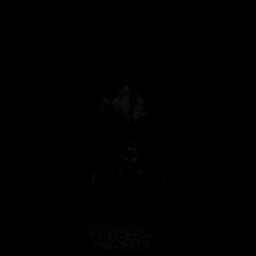
[im 14/66]
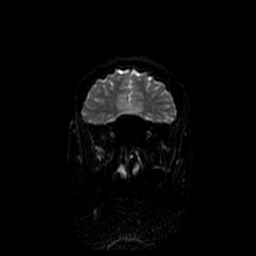
[im 27/66]
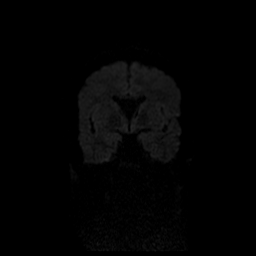
[im 40/66]
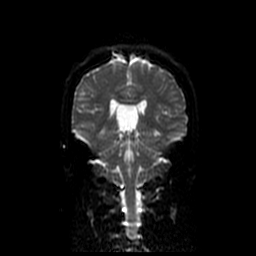
[im 53/66]
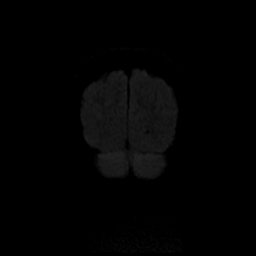
[im 66/66]
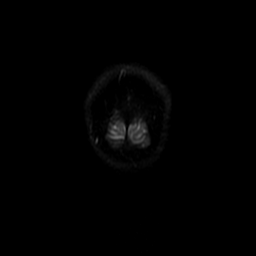

[Series 7: T2 · coronal · 3.0mm · 0.35mm/px · 2 of 27 slices shown (1 of 3)]
[im 1/27]
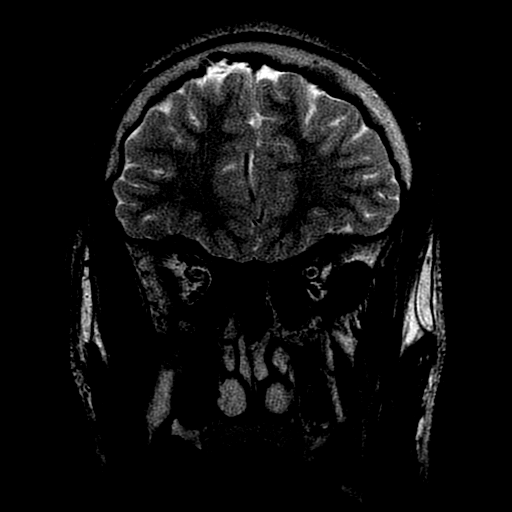
[im 27/27]
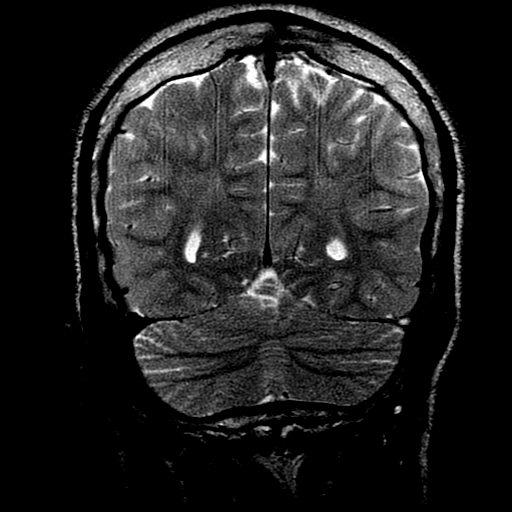

[Series 8: T2 · axial · 5.0mm · 0.43mm/px · z∈[-111,+30]mm · 2 of 25 slices shown (2 of 3)]
[im 1/25]
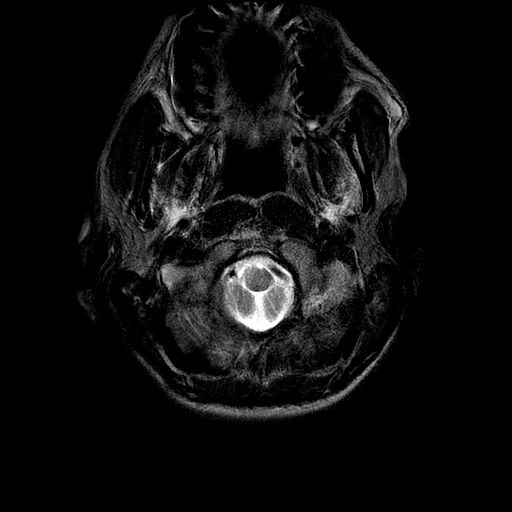
[im 25/25]
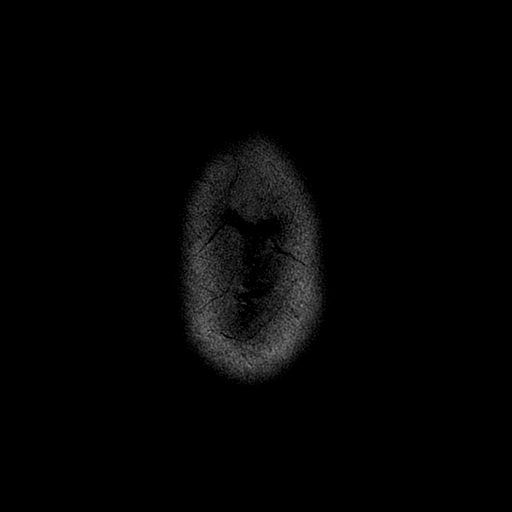

[Series 12: T2 · coronal · 5.0mm · 0.43mm/px · 3 of 30 slices shown (3 of 3)]
[im 1/30]
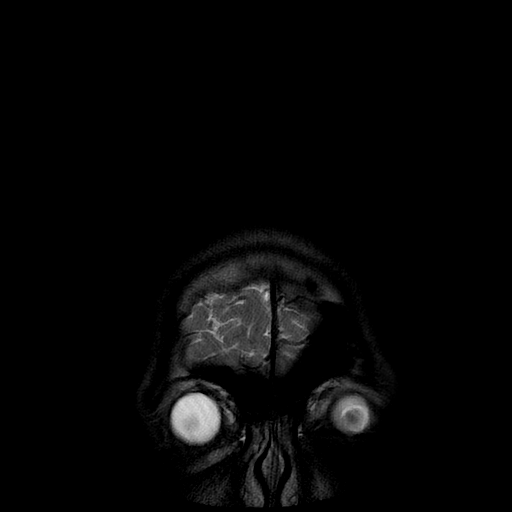
[im 15/30]
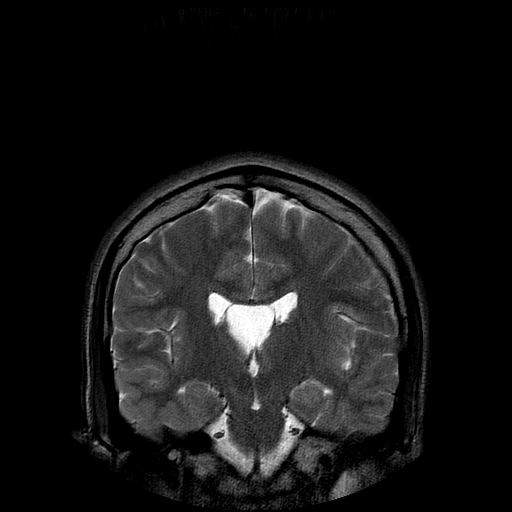
[im 30/30]
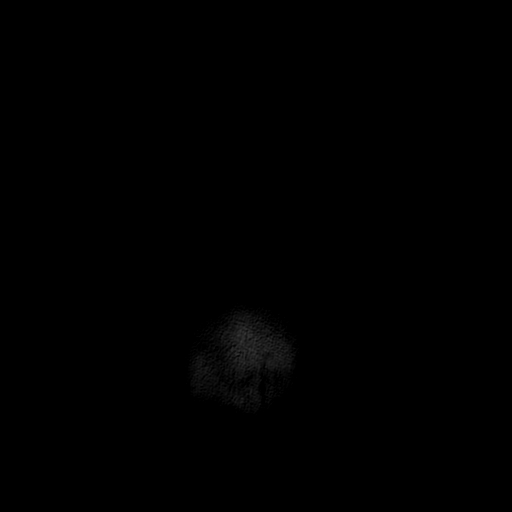

[Series 14: T1 post-contrast · coronal · 5.0mm · 0.43mm/px · 3 of 30 slices shown]
[im 1/30]
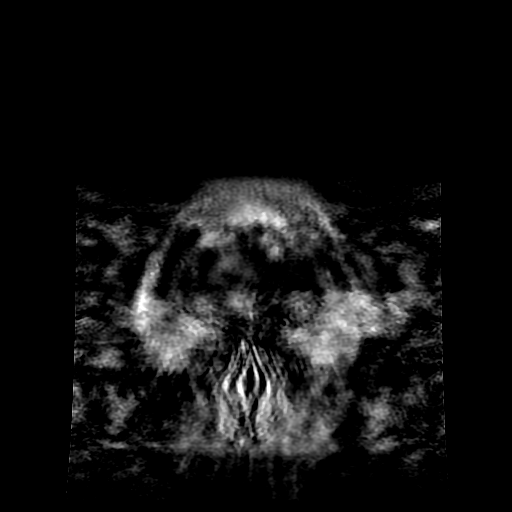
[im 15/30]
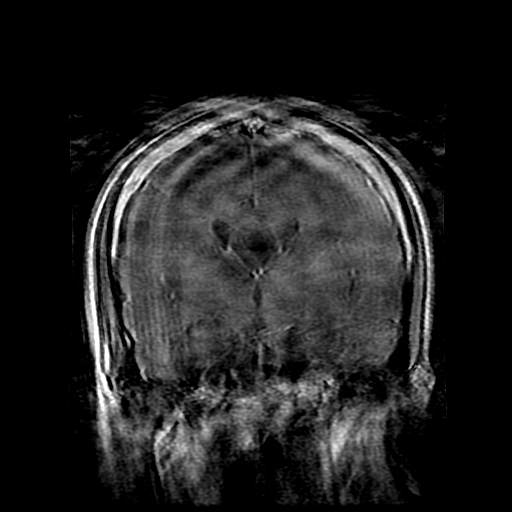
[im 30/30]
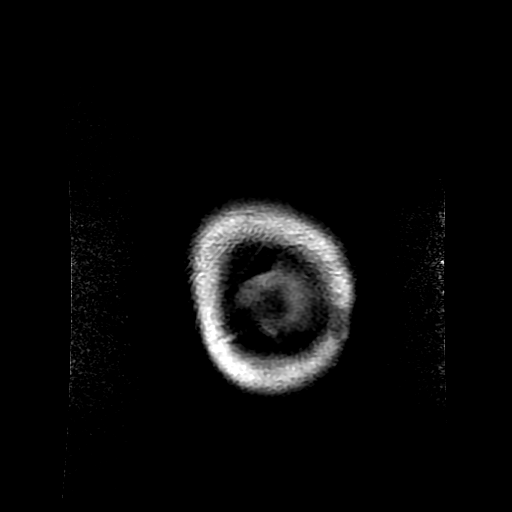

[Series 300: DWI · axial · 3.0mm · 1.09mm/px · z∈[-115,+5]mm · 4 of 42 slices shown (3 of 4)]
[im 1/42]
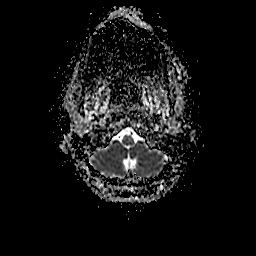
[im 14/42]
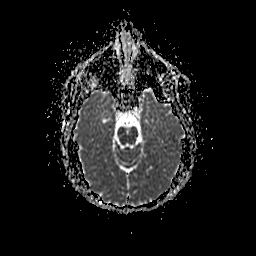
[im 28/42]
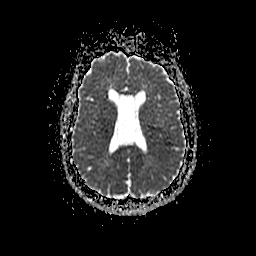
[im 42/42]
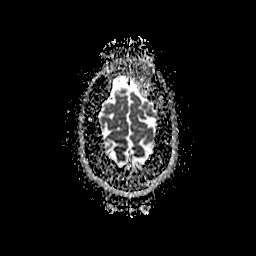

[Series 500: DWI · coronal · 5.0mm · 1.09mm/px · 3 of 33 slices shown (4 of 4)]
[im 1/33]
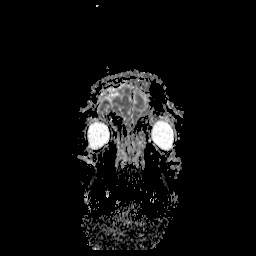
[im 17/33]
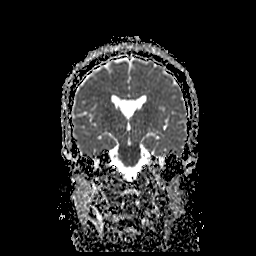
[im 33/33]
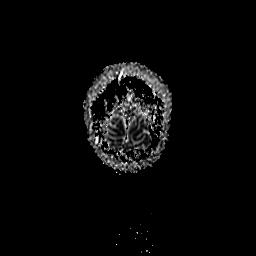

[33 of 48 positions shown; findings below may reference images not displayed]

FINDINGS: Diffusion imaging does not show any acute infarction or other cause
of restricted diffusion. The brainstem and cerebellum are normal.
The cerebral hemispheres do not show any evidence of old small or
large vessel infarction, mass lesion, hemorrhage, or hydrocephalus.
Mesial temporal lobes are normal. After contrast administration, no
abnormal enhancement occurs.

The patient does have a cavum septum pellucidum which appears
somewhat more dilated than was seen on the study of 06/27/2013.
Therefore, this could be an actual expanding cyst of the septum
pellucidum which can be symptomatic and associated with certain
behavioral disturbances. I am not aware of an association with
seizures. Nonetheless, neuro surgical evaluation is warranted, not
necessarily emergently however. Certainly this does not appear to be
resulting in obstruction or trapping of the lateral ventricles.
IMPRESSION: Normal appearance of the brain parenchyma itself. Expanding cyst of
the septum pellucidum. Transverse diameter in the frontal horn
region is 20 mm now as opposed 16 mm on 06/27/2013. The significance
is indeterminate, but this can be associated with clinical
abnormalities including behavioral disturbances.

## 2017-08-15 IMAGING — CR DG CHEST 1V PORT
1 series · 1 of 1 positions shown · non-contrast
Comparison: Prior radiograph from 08/24/2014.

CLINICAL DATA: Initial evaluation for acute seizure, cough.

EXAM:
PORTABLE CHEST - 1 VIEW

[AP]
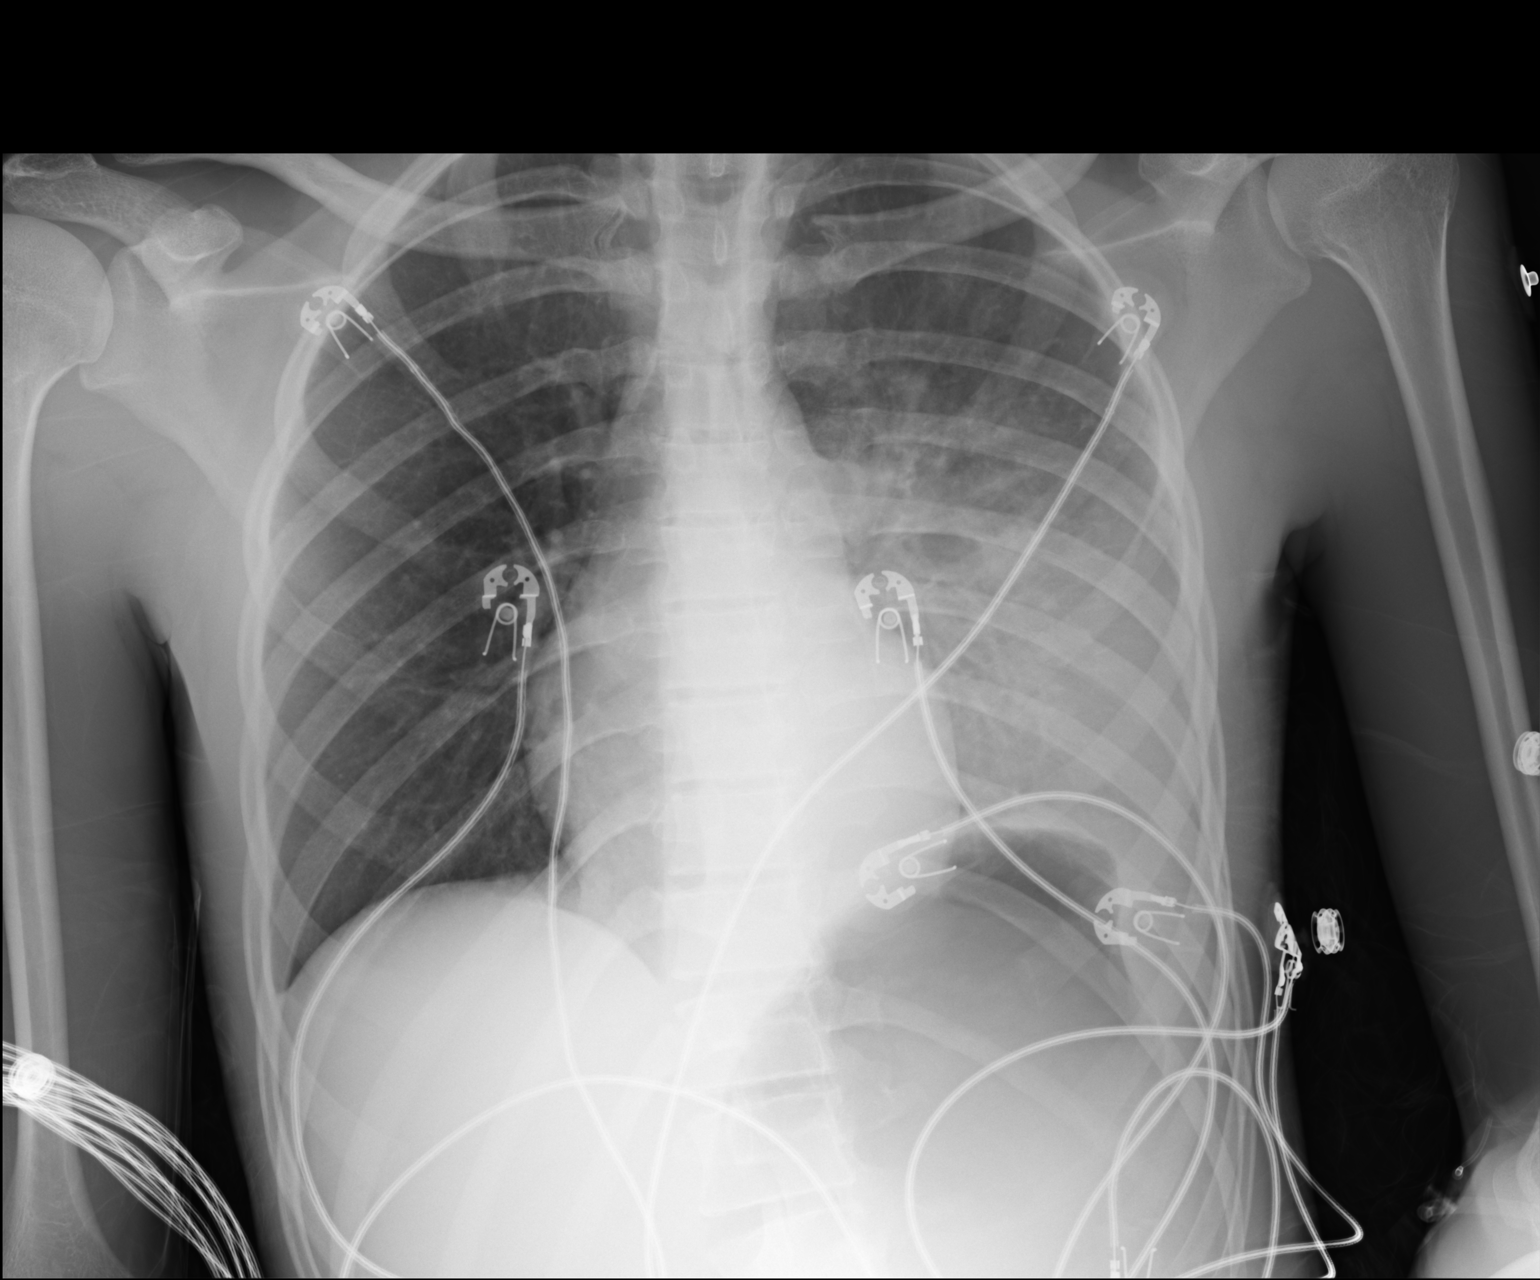

[1 of 1 positions shown; findings below may reference images not displayed]

FINDINGS: Cardiac and mediastinal silhouettes are stable in size and contour,
and remain within normal limits.

Lungs are mildly hypoinflated. There is hazy opacity within the mid
and lower left lung, which may reflect sequela of infectious or
aspiration pneumonitis. Right lung is clear. No pulmonary edema or
pleural effusion. No pneumothorax.

No acute osseus abnormality.
IMPRESSION: Hazy opacity within the mid and lower left lung, which may reflect
sequela of infectious or aspiration pneumonitis.

## 2017-12-18 DIAGNOSIS — Z23 Encounter for immunization: Secondary | ICD-10-CM | POA: Diagnosis not present

## 2017-12-18 DIAGNOSIS — Z0000 Encounter for general adult medical examination without abnormal findings: Secondary | ICD-10-CM | POA: Diagnosis not present

## 2017-12-24 ENCOUNTER — Ambulatory Visit: Payer: Medicare Other | Admitting: Neurology

## 2018-04-20 ENCOUNTER — Telehealth: Payer: Self-pay | Admitting: Neurology

## 2018-04-20 NOTE — Telephone Encounter (Signed)
Called and spoke with the patient's grandmother. Advised that our office ours are changing and we are seeing our patients that need to be seen in the am's. Advised that Dr Vickey Huger would feel that it would be best to see the patient in office visit. Patient would only be allowed to have one person with him. They requested to move the patient to Tuesday 3/24 at 10:00 am with check in of 9:30 am. I reviewed with the patient's grandmother if he and grandmother has been in contact or with themselves been sick or had a fever. She denies. They have not travelled outside of state or [place] recently in last 21 days. She states that they have been home and have been healthy. She verbalized the rules back that we will only allow one visitor with him.

## 2018-04-21 ENCOUNTER — Ambulatory Visit (INDEPENDENT_AMBULATORY_CARE_PROVIDER_SITE_OTHER): Payer: Self-pay | Admitting: Neurology

## 2018-04-21 ENCOUNTER — Other Ambulatory Visit: Payer: Self-pay

## 2018-04-21 DIAGNOSIS — Z0289 Encounter for other administrative examinations: Secondary | ICD-10-CM

## 2018-04-21 NOTE — Telephone Encounter (Signed)
Pt's grandmother said they have an emergency and unable to come to the appt today. She is not going to be at home for about hour, said she would call back. I did not r/s this appt.

## 2018-04-21 NOTE — Telephone Encounter (Signed)
Due to the patient requiring to have a caregiver. If they call back to reschedule I would just advise that if the patient is stable we can push the apt out until this Covid-19 is controlled. Pt can be rescheduled with NP also or Dr whoever has first ov available.

## 2018-04-22 ENCOUNTER — Ambulatory Visit: Payer: Medicare Other | Admitting: Neurology

## 2018-05-05 IMAGING — DX DG CHEST 2V
2 series · 2 of 2 positions shown · non-contrast
Comparison: 10/18/2014

CLINICAL DATA: Fall, altered mental status, vomiting for 3 days

EXAM:
CHEST  2 VIEW

[chest lat]
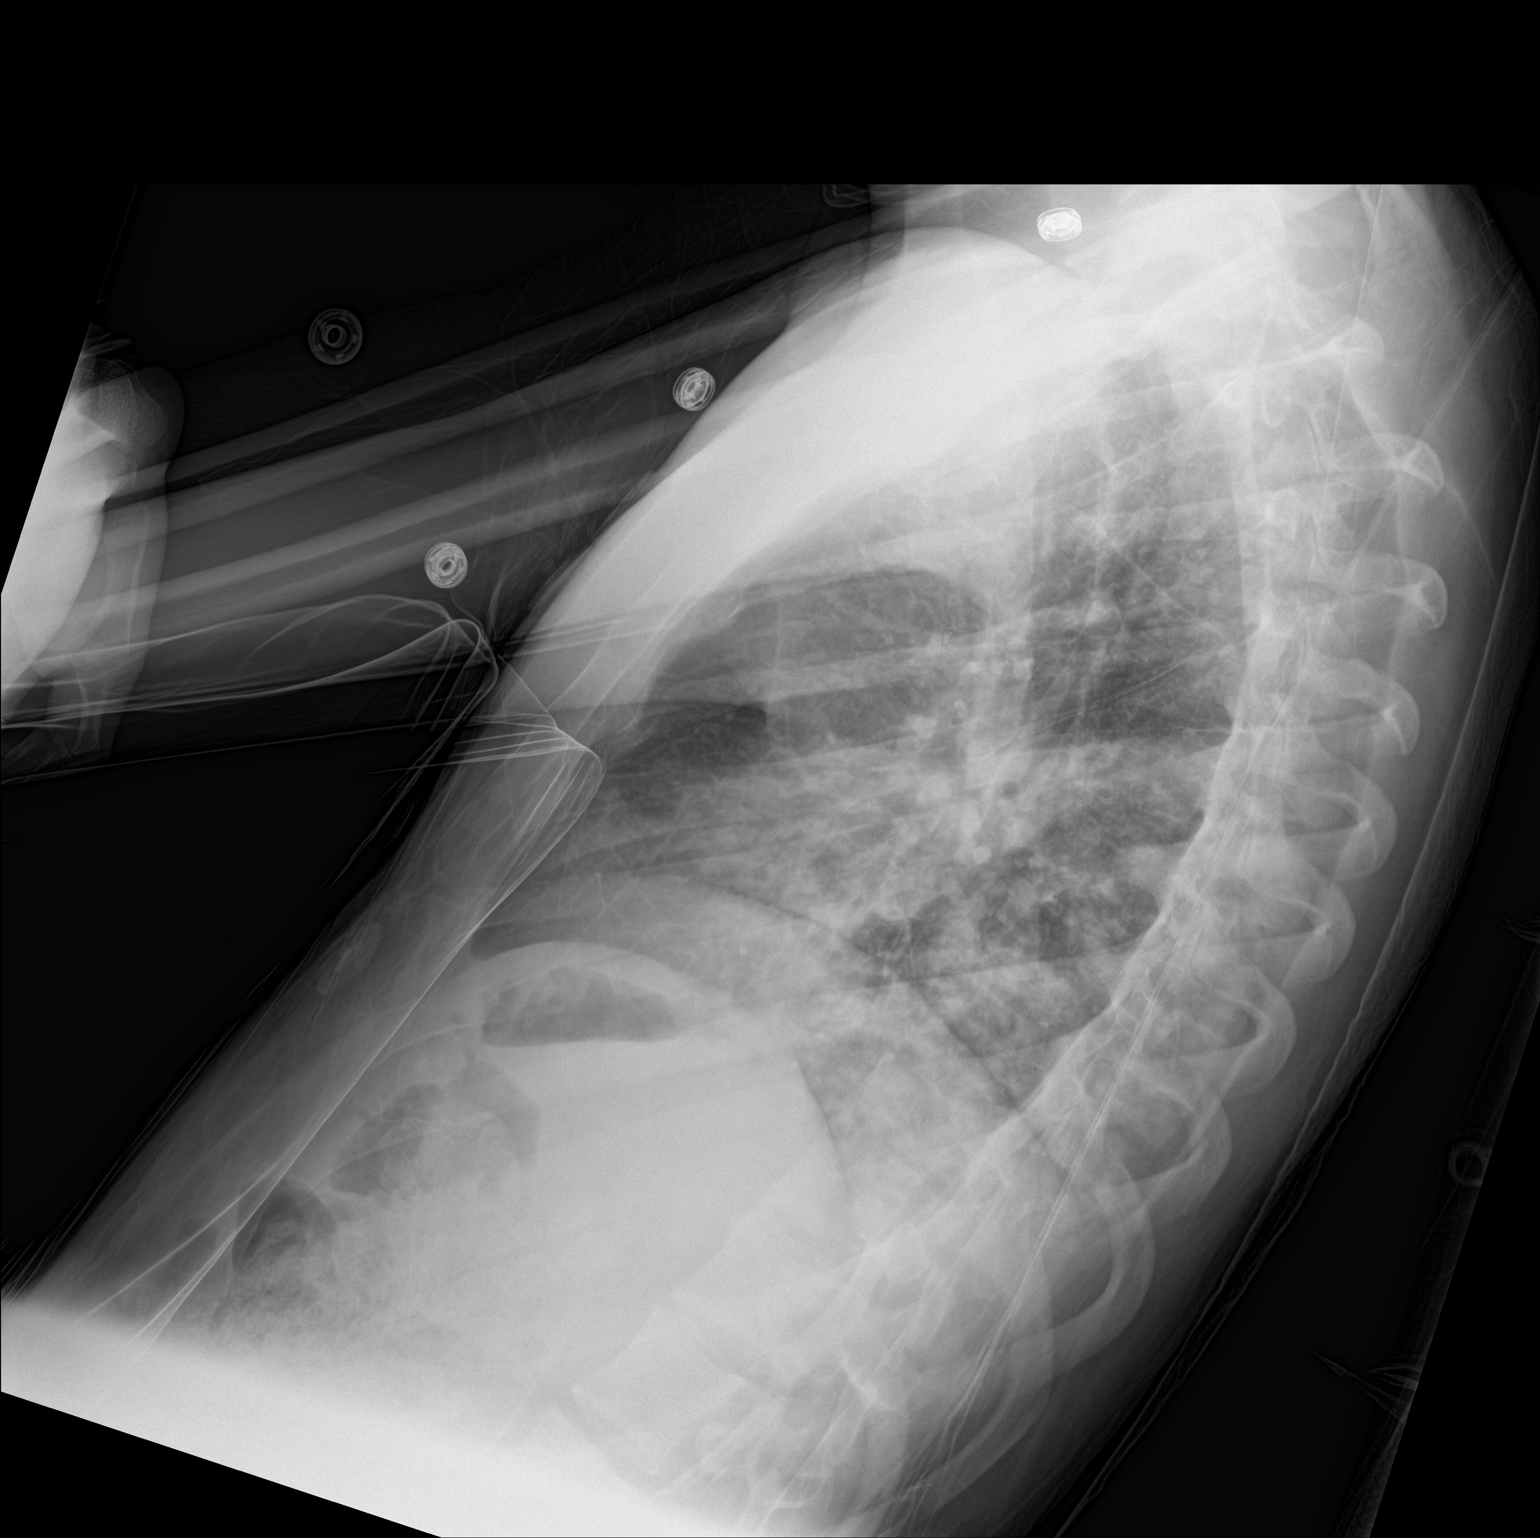

[chest ap]
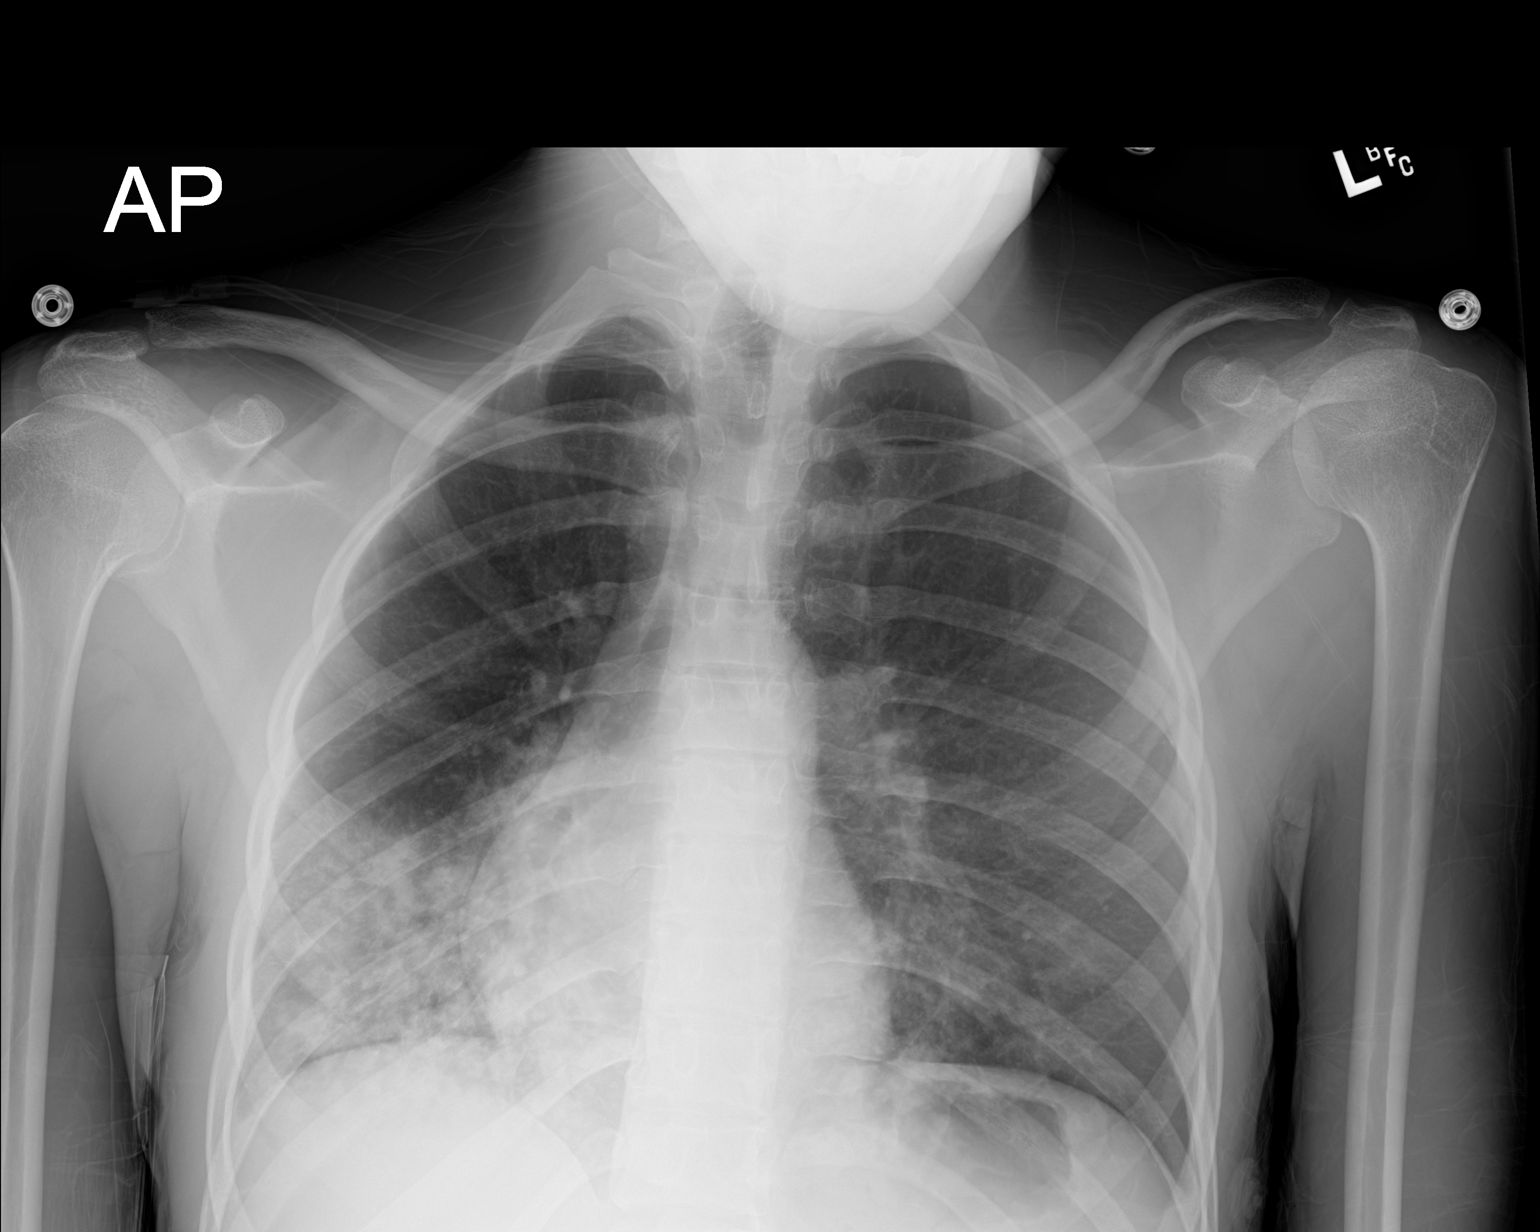

[2 of 2 positions shown; findings below may reference images not displayed]

FINDINGS: Cardiomediastinal silhouette is stable. There is patchy infiltrate
in right lower lobe suspicious for aspiration pneumonia. No
pulmonary edema. Left lung is clear.
IMPRESSION: Patchy infiltrate in right lower lobe suspicious for aspiration
pneumonia. No pulmonary edema.

## 2018-08-12 ENCOUNTER — Encounter: Payer: Self-pay | Admitting: Neurology

## 2018-08-12 ENCOUNTER — Other Ambulatory Visit: Payer: Self-pay

## 2018-08-12 ENCOUNTER — Ambulatory Visit (INDEPENDENT_AMBULATORY_CARE_PROVIDER_SITE_OTHER): Payer: Medicare HMO | Admitting: Neurology

## 2018-08-12 VITALS — BP 151/88 | HR 89 | Temp 98.7°F | Ht 69.0 in | Wt 124.0 lb

## 2018-08-12 DIAGNOSIS — I471 Supraventricular tachycardia: Secondary | ICD-10-CM | POA: Insufficient documentation

## 2018-08-12 DIAGNOSIS — R454 Irritability and anger: Secondary | ICD-10-CM | POA: Diagnosis not present

## 2018-08-12 DIAGNOSIS — R69 Illness, unspecified: Secondary | ICD-10-CM | POA: Diagnosis not present

## 2018-08-12 DIAGNOSIS — G808 Other cerebral palsy: Secondary | ICD-10-CM | POA: Diagnosis not present

## 2018-08-12 DIAGNOSIS — R569 Unspecified convulsions: Secondary | ICD-10-CM | POA: Diagnosis not present

## 2018-08-12 MED ORDER — LEVETIRACETAM 250 MG PO TABS: 250.0000 mg | ORAL_TABLET | Freq: Two times a day (BID) | ORAL | 5 refills | Status: DC

## 2018-08-12 MED ORDER — DIAZEPAM 20 MG RE GEL: 20.0000 mg | RECTAL | 5 refills | Status: AC | PRN

## 2018-08-12 NOTE — Progress Notes (Signed)
Provider:  Melvyn Novasarmen  Grayer Sproles, M D  Referring Provider: Maryagnes AmosStarkes-Perry, Takia S,* Primary Care Physician:  Maryagnes AmosStarkes-Perry, Takia S, FNP  Chief Complaint  Patient presents with   Follow-up    pt with grandma, rm 11. pt grandma states that last seizure was last week of June. pt is not on any medications to prevent the seizures.    HPI:  Jonathan [name] is a 23 y.o. african [place]  male with severe mental retardation, autism, severe disability. He is seen here with his grandmother , who raised him and is his main caretaker since early infancy.  A patient with a dramatic birth history of severe prematurity., mother suffered during pregnancy from meningitis - went into coma and died- he was delivered in [place] while his mother was on life support - born at just above 2 pounds weight.  and raised by Grandmother from [place] , [place].    PCP is NP: Rosario AdieStarkes-Perry,  NP .   Interval history from 12 August 2018.  His grandmother reports that he had a seizure on 11 May 2017, [facility] and got a refill for Diastat rectal Valium.  10 mg 1 dose.  The emergency room physician noted that the patient could move all extremities his face appeared symmetric he moved away from the examiner when his eyes were supposed to be examined.  There was no ongoing seizure-like activity during the hospital stay and he was discharged.  Medications did not change, he was not febrile, his blood pressure was 120/78 his pulse rate had temporarily been 105 the only physical symptom of a possible seizure.  He did have an EKG no significant change since last tracing was noted.  He had also a chest x-ray to make sure he does not have any kind of pneumonia.  It was not found to be abnormal.  The statement was that the patient had a breakthrough seizure likely due to stopping antiepileptic drugs.  He was in his usual state of health until prior to the seizure activity.  Back to baseline and  discharged with caregiver who is his grandmother.  Unable to obtain a urine sample, Diastat was ordered 10 mg gel at 21 hours 23 minutes at night.  Grandma states that he has had seizures at least once if not twice a month since that with that diabetes has been in the emergency room which is now 15 months ago.   Seizures are full body shakes that last about 3 minutes .  Since he presented to the emergency room in April 2019 he had not been on any seizure medications since he had been seizure-free for 18 months.  An EEG was performed at our office in 5 or 2017 and was read as normal.  There has been no repeat since [name] could not tolerate the electrodes on his head.    I have the pleasure of seeing Jonathan [name] today in the presence of Mrs. [name] on 23 December 2016, Khali history is well known and described elsewhere in the chart. He is quite active today, rhythmically moving, clapping his hands tapping his feet and producing nonverbal sounds. I asked about seizure activity since last seen, and  his grandmother reports  in October 2018 there was a spell  that could have been a seizure. He came out of it okay, did not have any adjustments in medication, and has no lasting deficits.    Jonathan [name] carries a diagnosis of autism  with severe cognitive and intellectual delay. He had never had seizures before July 27 when his mother witnessed a generalized seizure. With an arrival time at the ER he developed a second seizure which was witnessed by staff. The first seizure at home lasted for probably less than a minute as tonic-clonic convulsions but he did not lose consciousness following the seizure he appeared postictal and very drowsy. The patient had a similar event was in 2 hours later in the emergency room witnessed by a PA. The on-call neurologist decided to give a loading dose IV. The patient was brought in for CT of the head which did not show any acute changes;  there was no evidence of a bleed, infection,  swelling. Had not been back to baseline before the second seizure occurred, so this would be considered a status epilepticus.The patient is usually noncommunicative and nonverbal due to his autism. His primary care physician is Dr. Delila SpenceAngela [name] I asked his mother who was attending this visit if we can obtain appropriate previous diagnostic records and test results regarding [name]. Apparently he was tested in [place] where he was born and diagnosed with cerebral palsy, later worked up for dysphagia and chronic constipation. The patient is very slender and appears younger than his American age.  His grandmother moved him to her home in [place] - but he has seen physicians at [facility] , Dr Christance(?)and Dr. Glenna FellowsMichael [name], GI, and had been seen  in [facility].  Jonathan DallasAlex Benbrook had a dramatic birth history. His mother suffered from a sepsis during pregnancy and died after meningitis and she was brain dead but left on life support measures as she was pregnant Jonathan [name] was taken from the womb at a gestational age of [redacted] weeks , in the the seventh month of pregnancy. His mother had been comatose since the fifth week of pregnancy. He weighted 2 pounds and 2 ounces and  fit into the hand of his father . He stayed for a long time in the NICU until released. At age [age] year he suffered a bout of pneumonia. He never became fully verbal he is usually nonverbal but produces sounds that he repeats continuously. He learned crawling at 9-10 months ,walking at 14 months but was always tiptoeing. Jonathan [name] has is baseline some visual impairment but I don't know how to grade this on a test ,given his low ability to cooperate with a vision test / visual field test.  History from 01-12-15 Since I have seen [name] last time about 15 months ago he has significantly changed in his ability to cognitively connect, follow guidance. He still performs repetitively the same tasks and movements but he is completely  nonverbal. September he was admitted to hospital after a bout of seizures and stayed for 3 days. He had multiple tests there was no infection found no inflammation no injury. His seizures were just attributed to his autism and underlying disorder. He did develop pneumonia after seizure probably aspiration related. In the meantime his family has noted that he is more rigid- he is no longer able to climb stairs, or at least not reliably so.  This did not used to be a problem last year. His right foot has slightly inverted and his right knee seems to be permanently flexed and not extended. He also has a much higher ankle tone. He performs repetitively movements with his right hand.  His appetite has not changed , he is a "good eater" according to mom. But  he eats with long pauses, seems to stare off, unable to handle the food- he has begun to drool which was not the case last year either. He dropped frequently . This seems to encompass all seizure types seen in [place]enn[place] Gestaut syndrome. He has staring attacks, drop attacks, convulsions. I demonstrated the recent MRI findings. Jonathan [name] does have an abnormal corpus callosum, but I don't see any scar tissue in the subcortical areas. This cannot explain why his right foot is inverted unless he had some kind of more peripheral injury there is no evidence of stroke or abscess etc. What I would like to do is to consider Rodrigues for a VNS stimulator. There is also Rennis [place]ennox Gestaut specific medication that he has not been placed on thus far. The patient is maintained on Risperdal which is treating mostly behavior issues - I am unsure if this medication can continue .  While he did have 2 unexplained seizures  2015 , the seizure activity has progressed since then more frequent and Jonathan [name] seems to have lost some milestones.  He is less able to ambulate safely he seems to have frequent interruptions within activities-  even while he is eating-  that could be seizures staring attacks.   He also had some tonic drop attacks and convulsive seizures. I think it is necessary to change his medication to an O start specific medication and in addition I would like for him to be established as a possible VNS candidate. MRI reviewed.  - no bleed trauma or sepsis identified. However is underlying condition makes him prone to have epileptic seizures and seizures are more common in the autistic population as well as in the cerebral palsy population. Remains for now on Keppra. I will start FYCOMPA, at 2 mg liquid form given at bedtime once a day only. After 14 days I would like a call back abut his development on the medication- Shoukd he have still seizures I will  him to increase the dose from 2 mg to 4 mg nightly only.  We will then see if his seizure frequency is reduced or not and rely on his families report. His mother keeps a journal.     02-23-2015 [name] is seen here today for a right scheduled follow-up visit. After our last visit 6 weeks ago he was started on file comp. He did well on file comp alone and seems not to have had more seizure activity but when he restarted or talk Risperdal this fight, he became very sedated, begun drooling, was more difficult to guide. His he was less ambulatory as well -almost a little bit" drugged". The in women's lives on an upstairs apartment and he could not manage to walk the stairs up. For this reason his primary care physician tried to reduce the Risperdal first seeing if he could tolerate the medication but this led to him being more aggressive and easily agitated. We have also discussed the implantation of a VNS stimulator. Since the VNS stimulator is not a medication and free of meatbolic side effects- and may be able to support seizure control even if Fycompa is only tolerated or fully effective at a lower dose.  Interval history from 05/24/2015. Jonathan [name] has undergone and prolonged EEG study which showed no epileptiform activity only eyeblink  and rhythmic muscle artifact from motion. At home his behaviors have escalated and grandma states that at times Jonathan [name] seems to be more impatient, easier agitated to some degree more hyper and  more difficult to  guide. Anecdotally, she tells me that he has a new behavior pattern for example when they eat at a restaurant he has difficulties waiting for the food to arrive which didn't used to be a problem. Now he will sometimes try to take other people's food or knock off the plates. He is constantly tapping. He is scratching behind his right ear, he is also clapping more and more often more constantly. In other ways he seems to be under a lot more tension and impulse. Unfortunately he has also destroyed a few items of his grandmother's that were precious memories to her. This did not used to happen in the past and this behavior seems to be escalated since February. I am concerned that he does not have seizures as much as a behavior related problem.  Henrick will be 20 at this 1.st of June.I am very unsure now if he has seizures, and an EMU stay would be very hard for him.   Interval history from 09/19/2015, Asiel is seen here today after having suffered multiple spells that were described as seizure activity and being evaluated in the hospital's emergency room without being admitted to hospital. He has been more agitated more ritualistic more perseverating recently producing a lot of nonverbal noises. He appears somewhat restless and under pressure. His grandmother is very overwhelmed. ED Note : Adian Jablonowski is a 23 y.o. male presenting with a seizure. History is taken from the grandmother who is the primary caregiver. Patient has a history of mental retardation and is unable to provide any history. While she was getting him dressed today he had about a 2 minute seizure. Started with diffuse stiffening and neck extension and then patient had diffuse shaking. Patient is currently back to his mental baseline. He is  nonverbal at baseline. He has not been sick recently including no fevers, significant cough, urine changes, vomiting, diarrhea. Patient has a history of seizures, starting in the summer of 2016. Last seizure was in September 2016. Follows with neurology as an outpatient and had his [place] weaned off in April 2017. Developed a foot drop last year, grandmother thinks is from the [place] which is why the reasons why he was weaned off. The Ed started him back on Keppra.   Review of Systems: Out of a complete 14 system review, the patient complains of only the following symptoms, and all other reviewed systems are negative.;  Born with developmental deficits. autism , non verbal, MRDD - very agitated but not physically threatening.  He jumped up during the exam and hopped in place, making rhythmic vocalizations and clicking sounds.   Past Medical History:  Diagnosis Date   Allergic rhinitis    Autism    Mental retardation, idiopathic severe    Pneumonia 06/2015   Seizure ([place]) 08/24/2014    Past Surgical History:  Procedure Laterality Date   NO PAST SURGERIES      Current Outpatient Medications  Medication Sig Dispense Refill   cetirizine (ZYRTEC) 1 MG/ML syrup GIVE 10ML'S BY MOUTH AT BEDTIME AS NEEDED FOR ALLERGY SYMPTOM CONTROL 240 mL 1   ibuprofen (ADVIL,MOTRIN) 100 MG/5ML suspension Take 200 mg by mouth daily as needed for fever.      No current facility-administered medications for this visit.     Allergies as of 08/12/2018   (No Known Allergies)    Vitals: BP (!) 151/88    Pulse 89    Temp 98.7 F (37.1 C)    Ht 5\' 9"  (1.753 m)  Wt 124 lb (56.2 kg)    BMI 18.31 kg/m  Last Weight:  Wt Readings from Last 1 Encounters:  08/12/18 124 lb (56.2 kg)   Last Height:   Ht Readings from Last 1 Encounters:  08/12/18  (1.753 m)    Physical exam:  General: The patient is awake, alert and appears not in acute distress. The patient is well groomed. Head:  Normocephalic, atraumatic. Neck is supple. Mallampati 2, neck circumference:13  Cardiovascular:  Regular rate and rhythm , without  murmurs or carotid bruit, and without distended neck veins. Respiratory: Lungs are clear to auscultation. Skin:  Without evidence of edema, or rash Trunk: BMI is normal posture.  Neurologic exam : The patient is awake and alert, oriented to place and time.  Memory subjective described as intact.  There is a normal attention span & concentration ability. Speech is absent , no words are formed.non verbal repetition.   Mood and affect are aloof .  Cranial nerves: Pupils are equal and briskly reactive to light. Funduscopic exam had to be deferred today, 08-12-2018 Extraocular movements : no nystagmus.  He is not able to follow a moving object, and he doesn't direct his gaze to the direction of a sound or moving objects- he just smiled.  Hearing to finger rub is probable intact- he cannot follow commands.   Facial sensation appers intact to fine touch. Facial motor strength is symmetric, but he moves more on the right. He is incessantly moving- tapping and vocalizing agai.  He has well formed muscle bulk for the muscles of mastication. Shoulder shrug is normal.   Motor exam: normal biceps tone In both upper extremities, left hand is closed , right hand taps.   Sensory:  Fine touch, pinprick and vibration ; the patient cannot assist with this exam. Coordination: Rapid alternating movements in the fingers/hands were not tested due to autism, observation - he moves his left hand much less than his right  .Finger-to-nose maneuver can not be performed. Gait and station: Patient walks without assistive device and is able unassisted to climb up to the exam table.  Deep tendon reflexes: in the upper and lower extremities are asymmetric , left brisk with 2 beat clonus at ankle. Babinski maneuver response is down going right and deferred left.    Plan:  Treatment plan and  additional workup :  23year old african- [place] male patient , well groomed and nourished , living in his grandmothers care.   One hospitalization since last visit. He had one more seizure 05-11-2017 that let him to the ED , and since had them now each month once or Twice-   I will restart on medication, suggested Keppra.  Refilled Diastat.    We have given up on the EMU referral to [facility] Grandmother cannot arrange this , no transportation, he gets back to school. She cannot stay with him.    RV in 12 month with NP alternating with me.   The patient needs a calm environment, is easily overstimulated.  45-60 minute visit slot needed.     Cc: Dr. Patric Dykes ,  Danella Maiers, RN Social Worker:  "Miss Doreatha Martin Summer Mccolgan MD    08/12/2018

## 2018-08-13 ENCOUNTER — Telehealth: Payer: Self-pay | Admitting: Neurology

## 2018-08-13 ENCOUNTER — Other Ambulatory Visit: Payer: Self-pay | Admitting: Neurology

## 2018-08-13 MED ORDER — LEVETIRACETAM 100 MG/ML PO SOLN: 250.0000 mg | Freq: Two times a day (BID) | ORAL | 3 refills | Status: DC

## 2018-08-13 NOTE — Telephone Encounter (Signed)
Pts mother called and stated pt cant take pills well can it be transferred into a liquid   levETIRAcetam (KEPPRA) 250 MG tablet

## 2018-08-13 NOTE — Telephone Encounter (Signed)
Order is changed and sent for the pt to the pharmacy for a 3 month supply

## 2018-08-31 ENCOUNTER — Telehealth: Payer: Self-pay | Admitting: Neurology

## 2018-08-31 NOTE — Telephone Encounter (Signed)
Jonathan [name] called in and stated that she feels as if she has been giving the patient too much of his levETIRAcetam (KEPPRA) 100 MG/ML solution. She hasnt been using the syringe provided by the pharmacy she states she has been using the cup because its easier for her to do , she has been giving him close to 63mls a day, instead of a even 2.59mls bid, she states pt hasnt had any seizures but he has been very drowsy. Pt is almost out of the med and the bottle is almost empty. I advised Jonathan [name] going forward to make sure she uses the syringe to assure pt is getting correct dosage. She started giving pt med on 07/18 and she now states bottle shows about 127mls left.

## 2018-09-03 ENCOUNTER — Other Ambulatory Visit: Payer: Self-pay | Admitting: Neurology

## 2018-09-03 MED ORDER — LEVETIRACETAM 100 MG/ML PO SOLN: 250.0000 mg | Freq: Two times a day (BID) | ORAL | 2 refills | Status: DC

## 2018-09-24 DIAGNOSIS — N390 Urinary tract infection, site not specified: Secondary | ICD-10-CM | POA: Diagnosis not present

## 2018-09-25 DIAGNOSIS — Z79899 Other long term (current) drug therapy: Secondary | ICD-10-CM | POA: Diagnosis not present

## 2018-10-08 DIAGNOSIS — H1045 Other chronic allergic conjunctivitis: Secondary | ICD-10-CM | POA: Diagnosis not present

## 2018-10-08 DIAGNOSIS — R69 Illness, unspecified: Secondary | ICD-10-CM | POA: Diagnosis not present

## 2018-10-08 DIAGNOSIS — H5203 Hypermetropia, bilateral: Secondary | ICD-10-CM | POA: Diagnosis not present

## 2018-10-09 ENCOUNTER — Encounter (HOSPITAL_COMMUNITY): Payer: Self-pay | Admitting: Emergency Medicine

## 2018-10-09 ENCOUNTER — Other Ambulatory Visit: Payer: Self-pay

## 2018-10-09 ENCOUNTER — Emergency Department (HOSPITAL_COMMUNITY)
Admission: EM | Admit: 2018-10-09 | Discharge: 2018-10-09 | Disposition: A | Discharge status: Home / Self Care | Payer: Medicare HMO | Attending: Emergency Medicine | Admitting: Emergency Medicine

## 2018-10-09 DIAGNOSIS — Z8669 Personal history of other diseases of the nervous system and sense organs: Secondary | ICD-10-CM | POA: Insufficient documentation

## 2018-10-09 DIAGNOSIS — F840 Autistic disorder: Secondary | ICD-10-CM | POA: Insufficient documentation

## 2018-10-09 DIAGNOSIS — N3000 Acute cystitis without hematuria: Secondary | ICD-10-CM | POA: Insufficient documentation

## 2018-10-09 DIAGNOSIS — Z79899 Other long term (current) drug therapy: Secondary | ICD-10-CM | POA: Diagnosis not present

## 2018-10-09 DIAGNOSIS — R69 Illness, unspecified: Secondary | ICD-10-CM | POA: Diagnosis not present

## 2018-10-09 DIAGNOSIS — F79 Unspecified intellectual disabilities: Secondary | ICD-10-CM | POA: Diagnosis not present

## 2018-10-09 DIAGNOSIS — R319 Hematuria, unspecified: Secondary | ICD-10-CM | POA: Diagnosis not present

## 2018-10-09 LAB — BASIC METABOLIC PANEL
Anion gap: 8 (ref 5–15)
BUN: 8 mg/dL (ref 6–20)
CO2: 29 mmol/L (ref 22–32)
Calcium: 9.5 mg/dL (ref 8.9–10.3)
Chloride: 102 mmol/L (ref 98–111)
Creatinine, Ser: 0.75 mg/dL (ref 0.61–1.24)
GFR calc Af Amer: >60 mL/min (ref >60)
GFR calc non Af Amer: >60 mL/min (ref >60)
Glucose, Bld: 75 mg/dL (ref 70–99)
Potassium: 4.1 mmol/L (ref 3.5–5.1)
Sodium: 139 mmol/L (ref 135–145)

## 2018-10-09 LAB — URINALYSIS, ROUTINE W REFLEX MICROSCOPIC
Bilirubin Urine: NEGATIVE
Glucose, UA: NEGATIVE mg/dL
Hgb urine dipstick: NEGATIVE
Ketones, ur: 5 mg/dL — AB
Nitrite: NEGATIVE
Protein, ur: NEGATIVE mg/dL
Specific Gravity, Urine: 1.015 (ref 1.005–1.030)
pH: 7 (ref 5.0–8.0)

## 2018-10-09 LAB — CBC
HCT: 44.4 % (ref 39.0–52.0)
Hemoglobin: 14.9 g/dL (ref 13.0–17.0)
MCH: 28.6 pg (ref 26.0–34.0)
MCHC: 33.6 g/dL (ref 30.0–36.0)
MCV: 85.2 fL (ref 80.0–100.0)
Platelets: 295 10*3/uL (ref 150–400)
RBC: 5.21 MIL/uL (ref 4.22–5.81)
RDW: 13.6 % (ref 11.5–15.5)
WBC: 2.4 10*3/uL — ABNORMAL LOW (ref 4.0–10.5)
nRBC: 0 % (ref 0.0–0.2)

## 2018-10-09 MED ORDER — SULFAMETHOXAZOLE-TRIMETHOPRIM 800-160 MG PO TABS: 1.0000 | ORAL_TABLET | Freq: Two times a day (BID) | ORAL | 0 refills | Status: AC

## 2018-10-09 NOTE — ED Provider Notes (Signed)
[facility] EMERGENCY DEPARTMENT Provider Note   CSN: [id number] Arrival date & time: 10/09/18  1019     History   Chief Complaint Chief Complaint  Patient presents with  . Hematuria    HPI Jonathan [name] is a 23 y.o. male past medical history of autism, severe mental retardation, seizures presents emergency department today with chief complaint of hematuria.  Patient's grandmother is at the bedside and is contributing historian.  She states this morning when she was changing patient's briefs she noticed blood on the front of his brief.  She reports the blood was dry and it was a small amount.  Patient was recently treated for a UTI 1 week ago.  She is unsure of the medication he was treated with but states he took a pill twice a day.  She is unsure if the blood was in his urine or if he had scratched his penis.  He oftentimes will put his hand in his brief.  He did not take any medications for his symptoms prior to arrival.  She denies any fever, chills, behavior changes.  Has multiple caregivers at home however she is not concerned for any sexual abuse. Patient is unable to communicate but she states he has been acting like his usual self. History provided by grandmother with additional history obtained from chart review.      Past Medical History:  Diagnosis Date  . Allergic rhinitis   . Autism   . Mental retardation, idiopathic severe   . Pneumonia 06/2015  . Seizure ([place]) 08/24/2014    Patient Active Problem List   Diagnosis Date Noted  . Cerebral palsy, hemiplegic ([place]) 08/12/2018  . Supraventricular tachycardia ([place]) 08/12/2018  . Behavior-irritability 12/23/2016  . Mental retardation   . Autism spectrum disorder with accompanying intelllectual impairment, requiring very subtantial support (level 3) 02/23/2015  . Seizure disorder, status epilepticus, convulsive ([place]) 09/01/2014  . BMI less than 19,adult 07/15/2013  . Allergic rhinitis 07/15/2013  .  Constipation, unspecified constipation type 07/15/2013    Past Surgical History:  Procedure Laterality Date  . NO PAST SURGERIES          Home Medications    Prior to Admission medications   Medication Sig Start Date End Date Taking? Authorizing Provider  cetirizine (ZYRTEC) 1 MG/ML syrup GIVE 10ML'S BY MOUTH AT BEDTIME AS NEEDED FOR ALLERGY SYMPTOM CONTROL 05/17/16   Lurlean Leyden, MD  diazepam (DIASAT) 20 MG GEL Place 20 mg rectally as needed. 08/12/18   Dohmeier, Asencion Partridge, MD  ibuprofen (ADVIL,MOTRIN) 100 MG/5ML suspension Take 200 mg by mouth daily as needed for fever.  01/01/16   [provider]  levETIRAcetam (KEPPRA) 100 MG/ML solution Take 2.5 mLs (250 mg total) by mouth 2 (two) times daily. 09/03/18   Dohmeier, Asencion Partridge, MD  sulfamethoxazole-trimethoprim (BACTRIM DS) 800-160 MG tablet Take 1 tablet by mouth 2 (two) times daily for 3 days. 10/09/18 10/12/18  , Harley Hallmark, PA-C    Family History Family History  Problem Relation Age of Onset  . Arthritis Paternal Grandmother   . Obesity Other   . Stroke Other   . Hypertension Other   . Seizures Maternal Uncle     Social History Social History   Tobacco Use  . Smoking status: Never Smoker  . Smokeless tobacco: Never Used  Substance Use Topics  . Alcohol use: No  . Drug use: No     Allergies   Patient has no known allergies.   Review of  Systems Review of Systems  Unable to perform ROS: Patient nonverbal     Physical Exam Updated Vital Signs BP (!) 134/91 (BP Location: Left Arm)   Pulse 72   Temp 98.6 F (37 C) (Oral)   Resp 14   Ht 5\' 9"  (1.753 m)   Wt 56.2 kg   SpO2 100%   BMI 18.31 kg/m   Physical Exam Vitals signs and nursing note reviewed.  Constitutional:      General: He is not in acute distress.    Appearance: He is not ill-appearing.  HENT:     Head: Normocephalic and atraumatic.     Right Ear: Tympanic membrane and external ear normal.     Left Ear: Tympanic membrane and  external ear normal.     Nose: Nose normal.     Mouth/Throat:     Mouth: Mucous membranes are moist.     Pharynx: Oropharynx is clear.  Eyes:     General: No scleral icterus.       Right eye: No discharge.        Left eye: No discharge.     Extraocular Movements: Extraocular movements intact.     Conjunctiva/sclera: Conjunctivae normal.     Pupils: Pupils are equal, round, and reactive to light.  Neck:     Musculoskeletal: Normal range of motion.     Vascular: No JVD.  Cardiovascular:     Rate and Rhythm: Normal rate and regular rhythm.     Pulses: Normal pulses.          Radial pulses are 2+ on the right side and 2+ on the left side.     Heart sounds: Normal heart sounds.  Pulmonary:     Comments: Lungs clear to auscultation in all fields. Symmetric chest rise. No wheezing, rales, or rhonchi. Abdominal:     Tenderness: There is no right CVA tenderness or left CVA tenderness.     Comments: Abdomen is soft, non-distended, and non-tender in all quadrants. No rigidity, no guarding. No peritoneal signs.  Genitourinary:    Penis: Uncircumcised.      Comments: Insurance claims handler present for exam. No discharge or urethritis noted. No signs of sores or lesions or erythema on the penis or testicles. The penis and testicles are nontender. No testicular masses or swelling. No scrotal swelling. No signs of any inguinal hernias. Cremaster reflex present bilaterally.   Musculoskeletal: Normal range of motion.  Skin:    General: Skin is warm and dry.     Capillary Refill: Capillary refill takes less than 2 seconds.  Neurological:     Mental Status: He is oriented to person, place, and time.     GCS: GCS eye subscore is 4. GCS verbal subscore is 5. GCS motor subscore is 6.     Comments: Fluent speech, no facial droop.  Psychiatric:        Behavior: Behavior normal.      ED Treatments / Results  Labs (all labs ordered are listed, but only abnormal results are displayed) Labs Reviewed   URINALYSIS, ROUTINE W REFLEX MICROSCOPIC - Abnormal; Notable for the following components:      Result Value   Ketones, ur 5 (*)    Leukocytes,Ua TRACE (*)    Bacteria, UA FEW (*)    All other components within normal limits  CBC - Abnormal; Notable for the following components:   WBC 2.4 (*)    All other components within normal limits  URINE CULTURE  BASIC METABOLIC PANEL    EKG None  Radiology No results found.  Procedures Procedures (including critical care time)  Medications Ordered in ED Medications - No data to display   Initial Impression / Assessment and Plan / ED Course  I have reviewed the triage vital signs and the nursing notes.  Pertinent labs & imaging results that were available during my care of the patient were reviewed by me and considered in my medical decision making (see chart for details).  Patient seen and examined. Patient nontoxic appearing, in no apparent distress, vitals WNL, afebrile.  Exam no abdominal tenderness, CVA tenderness.  GU exam performed with chaperone Glade NurseKimberly RN.  No signs of trauma to penis. No bleeding seen.  Labs today show white count of 2.4, this appears close to patient's baseline.  Hemoglobin is stable at 14.9.  BMP unremarkable.  No renal insufficiency.  UA obtained by in and out catheter negative for blood.  He does have trace leukocytes, 0-5 WBC and few bacteria.  Patient's grandmother does not remember medication he took for UTI and I cannot see through chart review which medication he was prescribed.  Chart review does show he has had urine cultures in the past that have grown E. Coli.  Will discharge with prescription for Bactrim. The patient appears reasonably screened and/or stabilized for discharge and I doubt any other medical condition or other [facility] requiring further screening, evaluation, or treatment in the ED at this time prior to discharge. The patient is safe for discharge with strict return precautions discussed.  Recommend pcp follow up.   Portions of this note were generated with Scientist, clinical (histocompatibility and immunogenetics)Dragon dictation software. Dictation errors may occur despite best attempts at proofreading.    Final Clinical Impressions(s) / ED Diagnoses   Final diagnoses:  Acute cystitis without hematuria    ED Discharge Orders         Ordered    sulfamethoxazole-trimethoprim (BACTRIM DS) 800-160 MG tablet  2 times daily     10/09/18 1554           , LibertyKaitlyn E, PA-C 10/09/18 1557    Milagros Lollykstra, Richard S, MD 10/12/18 [phone]

## 2018-10-09 NOTE — Discharge Instructions (Signed)

## 2018-10-09 NOTE — ED Triage Notes (Signed)
Pt arrives via POV from home with hx of autism and CP. Mother reports patient had blood in pullup this morning. Pt nonverbal at baseline. VSS. Family denies recent fevers. Recently treated for UTI.

## 2018-10-11 LAB — URINE CULTURE: Culture: >=100000 — AB

## 2018-10-12 ENCOUNTER — Telehealth: Payer: Self-pay | Admitting: Emergency Medicine

## 2018-10-12 NOTE — Telephone Encounter (Signed)
Post ED Visit - Positive Culture Follow-up  Culture report reviewed by antimicrobial stewardship pharmacist: [facility] Team []  Elenor Quinones, Pharm.D. []  Heide Guile, Pharm.D., BCPS AQ-ID []  Parks Neptune, Pharm.D., BCPS []  Alycia Rossetti, Pharm.D., BCPS []  [place], Pharm.D., BCPS, AAHIVP []  Legrand Como, Pharm.D., BCPS, AAHIVP []  Salome Arnt, PharmD, BCPS []  Johnnette Gourd, PharmD, BCPS []  Hughes Better, PharmD, BCPS []  Leeroy Cha, PharmD []  Laqueta Linden, PharmD, BCPS []  Albertina Parr, PharmD Agnes Lawrence PharmD  [facility] Team []  Leodis Sias, PharmD []  Lindell Spar, PharmD []  Royetta Asal, PharmD []  Graylin Shiver, Rph []  Rema Fendt) Glennon Mac, PharmD []  Arlyn Dunning, PharmD []  Netta Cedars, PharmD []  Dia Sitter, PharmD []  Leone Haven, PharmD []  Gretta Arab, PharmD []  Theodis Shove, PharmD []  Peggyann Juba, PharmD []  Reuel Boom, PharmD   Positive urine culture Treated with sulfamethoxazole-trimethoprim, organism sensitive to the same and no further patient follow-up is required at this time.  Hazle Nordmann 10/12/2018, 9:33 AM

## 2019-02-02 DIAGNOSIS — R32 Unspecified urinary incontinence: Secondary | ICD-10-CM | POA: Diagnosis not present

## 2019-02-02 DIAGNOSIS — F840 Autistic disorder: Secondary | ICD-10-CM | POA: Diagnosis not present

## 2019-02-24 ENCOUNTER — Other Ambulatory Visit: Payer: Self-pay

## 2019-02-24 ENCOUNTER — Encounter: Payer: Self-pay | Admitting: Adult Health

## 2019-02-24 ENCOUNTER — Ambulatory Visit (INDEPENDENT_AMBULATORY_CARE_PROVIDER_SITE_OTHER): Payer: Medicare HMO | Admitting: Adult Health

## 2019-02-24 VITALS — BP 139/88 | HR 89 | Temp 97.5°F | Ht 69.0 in

## 2019-02-24 DIAGNOSIS — F79 Unspecified intellectual disabilities: Secondary | ICD-10-CM

## 2019-02-24 DIAGNOSIS — R569 Unspecified convulsions: Secondary | ICD-10-CM

## 2019-02-24 NOTE — Progress Notes (Signed)
PATIENT: Jonathan [name] DOB: 26-Nov-1995  REASON FOR VISIT: follow up HISTORY FROM: patient  HISTORY OF PRESENT ILLNESS: Today 02/24/19: Jonathan [name] is a 24 year old male with a history of cerebral palsy and seizures.  He returns today for follow-up.  He is with his grandmother today.  She reports that his last seizure was in July 2020.  She states that this is when Keppra was restarted.  She states that since then he has not had any seizure events.  He continues to live at home with her.  She denies any further changes.  He returns today for follow-up.   HISTORY His grandmother reports that he had a seizure on 11 May 2017, was seen at the [facility]'s emergency room and got a refill for Diastat rectal Valium.  10 mg 1 dose.  The emergency room physician noted that the patient could move all extremities his face appeared symmetric he moved away from the examiner when his eyes were supposed to be examined.  There was no ongoing seizure-like activity during the hospital stay and he was discharged.  Medications did not change, he was not febrile, his blood pressure was 120/78 his pulse rate had temporarily been 105 the only physical symptom of a possible seizure.  He did have an EKG no significant change since last tracing was noted.  He had also a chest x-ray to make sure he does not have any kind of pneumonia.  It was not found to be abnormal.  The statement was that the patient had a breakthrough seizure likely due to stopping antiepileptic drugs.  He was in his usual state of health until prior to the seizure activity.  Back to baseline and discharged with caregiver who is his grandmother.  Unable to obtain a urine sample, Diastat was ordered 10 mg gel at 21 hours 23 minutes at night.  REVIEW OF SYSTEMS: Out of a complete 14 system review of symptoms, the patient complains only of the following symptoms, and all other reviewed systems are negative.  See HPI  ALLERGIES: No Known  Allergies  HOME MEDICATIONS: Outpatient Medications Prior to Visit  Medication Sig Dispense Refill  . cetirizine (ZYRTEC) 1 MG/ML syrup GIVE 10ML'S BY MOUTH AT BEDTIME AS NEEDED FOR ALLERGY SYMPTOM CONTROL 240 mL 1  . diazepam (DIASAT) 20 MG GEL Place 20 mg rectally as needed. 1 Package 5  . levETIRAcetam (KEPPRA) 100 MG/ML solution Take 2.5 mLs (250 mg total) by mouth 2 (two) times daily. 450 mL 2  . ibuprofen (ADVIL,MOTRIN) 100 MG/5ML suspension Take 200 mg by mouth daily as needed for fever.      No facility-administered medications prior to visit.    PAST MEDICAL HISTORY: Past Medical History:  Diagnosis Date  . Allergic rhinitis   . Autism   . Mental retardation, idiopathic severe   . Pneumonia 06/2015  . Seizure ([facility]) 08/24/2014    PAST SURGICAL HISTORY: Past Surgical History:  Procedure Laterality Date  . NO PAST SURGERIES      FAMILY HISTORY: Family History  Problem Relation Age of Onset  . Arthritis Paternal Grandmother   . Obesity Other   . Stroke Other   . Hypertension Other   . Seizures Maternal Uncle     SOCIAL HISTORY: Social History   Socioeconomic History  . Marital status: Single    Spouse name: Not on file  . Number of children: Not on file  . Years of education: Not on file  . Highest education  level: Not on file  Occupational History  . Not on file  Tobacco Use  . Smoking status: Never Smoker  . Smokeless tobacco: Never Used  Substance and Sexual Activity  . Alcohol use: No  . Drug use: No  . Sexual activity: Never    Birth control/protection: Abstinence  Other Topics Concern  . Not on file  Social History Narrative  . Not on file   Social Determinants of Health   Financial Resource Strain:   . Difficulty of Paying Living Expenses: Not on file  Food Insecurity:   . Worried About Charity fundraiser in the Last Year: Not on file  . Ran Out of Food in the Last Year: Not on file  Transportation Needs:   . Lack of Transportation  (Medical): Not on file  . Lack of Transportation (Non-Medical): Not on file  Physical Activity:   . Days of Exercise per Week: Not on file  . Minutes of Exercise per Session: Not on file  Stress:   . Feeling of Stress : Not on file  Social Connections:   . Frequency of Communication with Friends and Family: Not on file  . Frequency of Social Gatherings with Friends and Family: Not on file  . Attends Religious Services: Not on file  . Active Member of Clubs or Organizations: Not on file  . Attends Archivist Meetings: Not on file  . Marital Status: Not on file  Intimate Partner Violence:   . Fear of Current or Ex-Partner: Not on file  . Emotionally Abused: Not on file  . Physically Abused: Not on file  . Sexually Abused: Not on file      PHYSICAL EXAM  Vitals:   02/24/19 1013  BP: 139/88  Pulse: 89  Temp: (!) 97.5 F (36.4 C)  Height: 5\' 9"  (1.753 m)   Body mass index is 18.31 kg/m.  Generalized: Well developed, in no acute distress   Neurological examination  Mentation: Alert.  Does not follow any commands Cranial nerve II-XII: Pupils were equal round reactive to light.  Motor: The motor testing reveals 5 over 5 strength of all 4 extremities. Good symmetric motor tone is noted throughout.  Sensory: Unable to assess Coordination: Unable to assess Gait and station: Patient walks on his toes with a forced arch.   DIAGNOSTIC DATA (LABS, IMAGING, TESTING) - I reviewed patient records, labs, notes, testing and imaging myself where available.  Lab Results  Component Value Date   WBC 2.4 (L) 10/09/2018   HGB 14.9 10/09/2018   HCT 44.4 10/09/2018   MCV 85.2 10/09/2018   PLT 295 10/09/2018      Component Value Date/Time   NA 139 10/09/2018 1057   K 4.1 10/09/2018 1057   CL 102 10/09/2018 1057   CO2 29 10/09/2018 1057   GLUCOSE 75 10/09/2018 1057   BUN 8 10/09/2018 1057   CREATININE 0.75 10/09/2018 1057   CREATININE 0.64 03/17/2015 0954   CALCIUM 9.5  10/09/2018 1057   PROT 8.0 05/11/2017 1843   ALBUMIN 4.4 05/11/2017 1843   AST 21 05/11/2017 1843   ALT 18 05/11/2017 1843   ALKPHOS 64 05/11/2017 1843   BILITOT 0.8 05/11/2017 1843   GFRNONAA >60 10/09/2018 1057   GFRAA >60 10/09/2018 1057      ASSESSMENT AND PLAN 24 y.o. year old male  has a past medical history of Allergic rhinitis, Autism, Mental retardation, idiopathic severe, Pneumonia (06/2015), and Seizure ([place]) (08/24/2014). here with:  1.  Seizures  -Continue Keppra -Patient is getting reestablished with a primary care his grandmother asked that I refill Zyrtec for them.  I am amenable to this. -Advised if he has any seizure events he should let us know  Follow-up in 1 year or sooner if needed   I spent 15 minutes with the patient. 50% of this time was spent reviewing plan of care   Butch Penny, MSN, NP-C 02/24/2019, 10:32 AM [facility] [address] [place], [place] [address] [phone]

## 2019-02-24 NOTE — Patient Instructions (Signed)
Continue Keppra If you have any seizure events please let us know.   

## 2019-03-17 DIAGNOSIS — R32 Unspecified urinary incontinence: Secondary | ICD-10-CM | POA: Diagnosis not present

## 2019-03-17 DIAGNOSIS — F840 Autistic disorder: Secondary | ICD-10-CM | POA: Diagnosis not present

## 2019-04-06 ENCOUNTER — Other Ambulatory Visit: Payer: Self-pay

## 2019-04-06 ENCOUNTER — Emergency Department (HOSPITAL_COMMUNITY)
Admission: EM | Admit: 2019-04-06 | Discharge: 2019-04-06 | Disposition: A | Discharge status: Home / Self Care | Payer: Medicare HMO | Attending: Emergency Medicine | Admitting: Emergency Medicine

## 2019-04-06 ENCOUNTER — Encounter (HOSPITAL_COMMUNITY): Payer: Self-pay

## 2019-04-06 DIAGNOSIS — Y9389 Activity, other specified: Secondary | ICD-10-CM | POA: Diagnosis not present

## 2019-04-06 DIAGNOSIS — F840 Autistic disorder: Secondary | ICD-10-CM | POA: Diagnosis not present

## 2019-04-06 DIAGNOSIS — W228XXA Striking against or struck by other objects, initial encounter: Secondary | ICD-10-CM | POA: Diagnosis not present

## 2019-04-06 DIAGNOSIS — Y929 Unspecified place or not applicable: Secondary | ICD-10-CM | POA: Diagnosis not present

## 2019-04-06 DIAGNOSIS — S00432A Contusion of left ear, initial encounter: Secondary | ICD-10-CM | POA: Insufficient documentation

## 2019-04-06 DIAGNOSIS — S0991XA Unspecified injury of ear, initial encounter: Secondary | ICD-10-CM | POA: Diagnosis present

## 2019-04-06 DIAGNOSIS — R52 Pain, unspecified: Secondary | ICD-10-CM | POA: Diagnosis not present

## 2019-04-06 DIAGNOSIS — Y998 Other external cause status: Secondary | ICD-10-CM | POA: Diagnosis not present

## 2019-04-06 DIAGNOSIS — H6122 Impacted cerumen, left ear: Secondary | ICD-10-CM | POA: Diagnosis not present

## 2019-04-06 DIAGNOSIS — R220 Localized swelling, mass and lump, head: Secondary | ICD-10-CM | POA: Diagnosis not present

## 2019-04-06 MED ORDER — LIDOCAINE HCL (PF) 1 % IJ SOLN
5.0000 mL | Freq: Once | INTRAMUSCULAR | Status: DC
  Filled 2019-04-06: qty 5

## 2019-04-06 NOTE — Procedures (Signed)
Preop diagnosis: Left auricular hematoma Postop diagnosis: same Procedure: Incision and drainage of left auricular hematoma Surgeon: Jenne Pane Anesth: Local with 1% lidocaine with 1:100,000 epinephrine Compl: None Findings: Sizeable pocket of old blood in auricle was drained. Description of procedure: After discussing risks, benefits, and alternatives, the patient was placed in a reclined position.  The left auricle was injected on both sides using local anesthetic.  An incision was made at the margin of the scaphoid region under the helical rim.  Dissection was then performed into the hematoma pocket that was widely opened.  Old blood was pushed out of the pocket decompressing the space.  The skin was then tacked down using 3-0 plain gut with a running, through-and-through, mattress suture.  The patient was cleaned off and Bacitracin ointment was added to the wound and the sutures.  He tolerated the procedure well and was returned to nursing care in stable condition

## 2019-04-06 NOTE — Discharge Instructions (Addendum)
Apply bacitracin twice daily.  Follow-up with Dr. Jenne Pane, ENT, in one week.  Please call office if you do not hear from them tomorrow to schedule the time/date for your appointment.  Tylenol as needed for pain.  Please read the attachment on wound care.

## 2019-04-06 NOTE — ED Provider Notes (Signed)
[facility] EMERGENCY DEPARTMENT Provider Note   CSN: [id number] Arrival date & time: 04/06/19  1216     History Chief Complaint  Patient presents with  . Otalgia    Jonathan [name] is a 24 y.o. male with PMH significant for traumatic brain injury and resultant severe mental retardation.  Patient is nonverbal and is only able to make guttural noises.  He is accompanied by his caregiver, [name], who reports that he typically will strike his ears bilaterally and has been doing so for years but has not been striking his left ear which they interpreted suggests discomfort.  She is also noting a significant increase in his swelling that first began 3 days ago.  I also obtained history from patient's grandmother, [name], who states that patient has otherwise been well with no fevers, chills, or diminished appetite.  The swelling is what prompted them to come to the ED for evaluation.     HPI     Past Medical History:  Diagnosis Date  . Allergic rhinitis   . Autism   . Mental retardation, idiopathic severe   . Pneumonia 06/2015  . Seizure (HCC) 08/24/2014    Patient Active Problem List   Diagnosis Date Noted  . Cerebral palsy, hemiplegic (HCC) 08/12/2018  . Supraventricular tachycardia (HCC) 08/12/2018  . Behavior-irritability 12/23/2016  . Intellectual disability   . Autism spectrum disorder with accompanying intelllectual impairment, requiring very subtantial support (level 3) 02/23/2015  . Seizure disorder, status epilepticus, convulsive (HCC) 09/01/2014  . BMI less than 19,adult 07/15/2013  . Allergic rhinitis 07/15/2013  . Constipation, unspecified constipation type 07/15/2013    Past Surgical History:  Procedure Laterality Date  . NO PAST SURGERIES         Family History  Problem Relation Age of Onset  . Arthritis Paternal Grandmother   . Obesity Other   . Stroke Other   . Hypertension Other   . Seizures Maternal Uncle     Social History   Tobacco  Use  . Smoking status: Never Smoker  . Smokeless tobacco: Never Used  Substance Use Topics  . Alcohol use: No  . Drug use: No    Home Medications Prior to Admission medications   Medication Sig Start Date End Date Taking? Authorizing Provider  cetirizine (ZYRTEC) 1 MG/ML syrup GIVE 10ML'S BY MOUTH AT BEDTIME AS NEEDED FOR ALLERGY SYMPTOM CONTROL Patient taking differently: Take 10 mg by mouth daily as needed.  05/17/16  Yes Maree Erie, MD  diazepam (DIASAT) 20 MG GEL Place 20 mg rectally as needed. Patient not taking: Reported on 04/06/2019 08/12/18   Dohmeier, Porfirio Mylar, MD  ibuprofen (ADVIL,MOTRIN) 100 MG/5ML suspension Take 200 mg by mouth daily as needed for fever.  01/01/16   [provider]  levETIRAcetam (KEPPRA) 100 MG/ML solution Take 2.5 mLs (250 mg total) by mouth 2 (two) times daily. Patient not taking: Reported on 04/06/2019 09/03/18   Dohmeier, Porfirio Mylar, MD    Allergies    Patient has no known allergies.  Review of Systems   Review of Systems  Unable to perform ROS: Patient nonverbal    Physical Exam Updated Vital Signs BP (!) 158/107 (BP Location: Right Arm)   Pulse 72   Temp (!) 97.4 F (36.3 C) (Oral)   Resp 20   SpO2 99%   Physical Exam Vitals and nursing note reviewed. Exam conducted with a chaperone present.  Constitutional:      Appearance: Normal appearance.  HENT:  Head: Normocephalic and atraumatic.     Ears:     Comments: Right ear: Normal. Mild cerumen in canal, not impacted. Left ear, external: Significant swelling with area of fluctuance concerning for abscess.  Mildly erythematous with slight ecchymoses.  No induration.  No mastoid erythema or swelling.  Unable to reliably assess for tenderness as patient nonverbal.  Not particularly warm.  Left ear, internal: Cannot visualize TM.  Granulation tissue vs. Cerumen impaction.   Eyes:     General: No scleral icterus.    Conjunctiva/sclera: Conjunctivae normal.  Cardiovascular:     Rate  and Rhythm: Normal rate and regular rhythm.     Pulses: Normal pulses.     Heart sounds: Normal heart sounds.  Pulmonary:     Effort: Pulmonary effort is normal. No respiratory distress.     Breath sounds: Normal breath sounds.  Musculoskeletal:     Cervical back: Normal range of motion and neck supple. No rigidity.  Skin:    General: Skin is dry.  Neurological:     Mental Status: He is alert.     GCS: GCS eye subscore is 4. GCS verbal subscore is 5. GCS motor subscore is 6.  Psychiatric:        Mood and Affect: Mood normal.        Behavior: Behavior normal.        Thought Content: Thought content normal.            ED Results / Procedures / Treatments   Labs (all labs ordered are listed, but only abnormal results are displayed) Labs Reviewed - No data to display  EKG None  Radiology No results found.  Procedures .Marland KitchenIncision and Drainage  Date/Time: 04/06/2019 4:48 PM Performed by: Lorelee New, PA-C Authorized by: Lorelee New, PA-C   Consent:    Consent obtained:  Verbal   Consent given by:  Guardian   Risks discussed:  Bleeding, incomplete drainage, pain, infection and damage to other organs   Alternatives discussed:  No treatment and observation Location:    Type:  Hematoma   Size:  5 cm   Location:  Head   Head location:  L external ear Pre-procedure details:    Skin preparation:  Antiseptic wash Anesthesia (see MAR for exact dosages):    Anesthesia method:  Topical application and local infiltration   Local anesthetic:  Lidocaine 1% w/o epi Procedure type:    Complexity:  Simple Procedure details:    Incision types:  Stab incision   Scalpel blade:  10   Drainage:  Bloody   Drainage amount:  Moderate   Wound treatment:  Wound left open Post-procedure details:    Patient tolerance of procedure:  Tolerated well, no immediate complications   (including critical care time)  Medications Ordered in ED Medications  lidocaine (PF)  (XYLOCAINE) 1 % injection 5 mL (has no administration in time range)    ED Course  I have reviewed the triage vital signs and the nursing notes.  Pertinent labs & imaging results that were available during my care of the patient were reviewed by me and considered in my medical decision making (see chart for details).  Clinical Course as of Apr 06 1847  Tue Apr 06, 2019  1506 Spoke with Dr. Jenne Pane who will come evaluate patient here in the ED.   [GG]    Clinical Course User Index [GG] Lorelee New, PA-C   MDM Rules/Calculators/A&P  Initially suspected that patient had an abscess involving his left ear, but after patient's caretaker provided history that he repeatedly strikes his ears bilaterally, I became concerned for hematoma.  However, in an effort to avoid resultant cauliflower ear, attempted to drain at bedside.  I was able to drain a significant amount of blood, however his ear remained inflamed and I cannot tell if his left ear was impacted with cerumen or resulting from granulation tissue.  Given concern for possible malignant otitis externa and after discussion with Dr. Ashok Cordia, decided to consult with ENT to determine if any imaging or labs should be obtained.   Spoke with Dr. Redmond Baseman who suspects a perichondrial hematoma and will evaluate patient at bedside.  Dr. Redmond Baseman successfully performed incision and drainage of left auricular hematoma with quilting fast-gut sutures at bedside.  Provided patient with 1 to bacitracin ointment and instructions to apply twice daily.  Provide patient with wound care instructions.  Plan is for him to follow-up with Dr. Redmond Baseman in 1 week for reevaluation.  Tylenol as needed for pain.  Discussed plan with patient's grandmother and caretaker who are both understanding and agreeable.  ED return precautions discussed.   Final Clinical Impression(s) / ED Diagnoses Final diagnoses:  Hematoma of left auricular region    Rx / DC  Orders ED Discharge Orders    None       Corena Herter, PA-C 04/06/19 1849    Lajean Saver, MD 04/08/19 [phone]

## 2019-04-06 NOTE — Consult Note (Signed)
Reason for Consult: Auricular hematoma Referring Physician: ER  Jonathan [name] is an 24 y.o. male.  HPI: 24 year old male with nonverbal cognitive impairment who frequently hits his own ears has had swelling of the left external ear for the past several days that has not improved.  He presents to the ER.  The ER staff performed a simple stab incision without resolution and requested consultation.  Past Medical History:  Diagnosis Date  . Allergic rhinitis   . Autism   . Mental retardation, idiopathic severe   . Pneumonia 06/2015  . Seizure (HCC) 08/24/2014    Past Surgical History:  Procedure Laterality Date  . NO PAST SURGERIES      Family History  Problem Relation Age of Onset  . Arthritis Paternal Grandmother   . Obesity Other   . Stroke Other   . Hypertension Other   . Seizures Maternal Uncle     Social History:  reports that he has never smoked. He has never used smokeless tobacco. He reports that he does not drink alcohol or use drugs.  Allergies: No Known Allergies  Medications: I have reviewed the patient's current medications.  No results found for this or any previous visit (from the past 48 hour(s)).  No results found.  Review of Systems  Unable to perform ROS: Patient nonverbal   Blood pressure (!) 141/87, pulse 90, temperature 98.2 F (36.8 C), temperature source Oral, resp. rate 12, SpO2 100 %. Physical Exam  Constitutional: He appears well-developed and well-nourished. No distress.  HENT:  Head: Normocephalic.  Right Ear: External ear normal.  Nose: Nose normal.  Left auricle with sizeable hematoma.  EAC with cerumen impaction.  Eyes: Pupils are equal, round, and reactive to light. Conjunctivae are normal.  Cardiovascular: Normal rate.  Respiratory: Effort normal.  Musculoskeletal:     Cervical back: Normal range of motion and neck supple.  Neurological: He is alert.  Non verbal  Skin: Skin is warm and dry.  Psychiatric:  Non verbal     Assessment/Plan: Left auricular hematoma  Incision and drainage of the hematoma was performed along with quilting down the skin.  See procedure note.  I instructed his caregiver to apply antibiotic ointment twice daily to the incision and sutures and treat him with Tylenol for a few days.  He will follow-up with me in one week.  Christia Reading 04/06/2019, 5:30 PM

## 2019-04-06 NOTE — ED Triage Notes (Signed)
Pt accompanied by caregiver who reports left ear pain and swelling over the last several days. Hx of TBI.

## 2019-04-06 NOTE — ED Notes (Addendum)
Patient caregiver verbalizes understanding of discharge instructions. Opportunity for questioning and answers were provided. Pt discharged from ED.

## 2019-04-13 DIAGNOSIS — R32 Unspecified urinary incontinence: Secondary | ICD-10-CM | POA: Diagnosis not present

## 2019-04-13 DIAGNOSIS — F840 Autistic disorder: Secondary | ICD-10-CM | POA: Diagnosis not present

## 2019-04-16 ENCOUNTER — Encounter (HOSPITAL_BASED_OUTPATIENT_CLINIC_OR_DEPARTMENT_OTHER): Payer: Self-pay | Admitting: Otolaryngology

## 2019-04-16 ENCOUNTER — Other Ambulatory Visit: Payer: Self-pay | Admitting: Otolaryngology

## 2019-04-19 ENCOUNTER — Encounter (HOSPITAL_BASED_OUTPATIENT_CLINIC_OR_DEPARTMENT_OTHER): Payer: Self-pay | Admitting: Otolaryngology

## 2019-04-19 ENCOUNTER — Inpatient Hospital Stay (HOSPITAL_COMMUNITY): Admission: RE | Admit: 2019-04-19 | Payer: Medicare HMO | Source: Ambulatory Visit

## 2019-04-19 ENCOUNTER — Other Ambulatory Visit: Payer: Self-pay

## 2019-04-20 ENCOUNTER — Other Ambulatory Visit (HOSPITAL_COMMUNITY)
Admission: RE | Admit: 2019-04-20 | Discharge: 2019-04-20 | Disposition: A | Discharge status: Home / Self Care | Payer: Medicare HMO | Source: Ambulatory Visit | Attending: Otolaryngology | Admitting: Otolaryngology

## 2019-04-20 DIAGNOSIS — Z20822 Contact with and (suspected) exposure to covid-19: Secondary | ICD-10-CM | POA: Insufficient documentation

## 2019-04-20 DIAGNOSIS — Z01812 Encounter for preprocedural laboratory examination: Secondary | ICD-10-CM | POA: Insufficient documentation

## 2019-04-20 LAB — SARS CORONAVIRUS 2 (TAT 6-24 HRS): SARS Coronavirus 2: NEGATIVE

## 2019-04-20 NOTE — Progress Notes (Signed)
Pt's chart reviewed with Dr. Okey Dupre. Ok to proceed with planned procedure at [facility].

## 2019-04-21 ENCOUNTER — Encounter (HOSPITAL_BASED_OUTPATIENT_CLINIC_OR_DEPARTMENT_OTHER)
Admission: RE | Disposition: A | Discharge status: Home / Self Care | Payer: Self-pay | Source: Home / Self Care | Attending: Otolaryngology

## 2019-04-21 ENCOUNTER — Encounter (HOSPITAL_BASED_OUTPATIENT_CLINIC_OR_DEPARTMENT_OTHER): Payer: Self-pay | Admitting: Otolaryngology

## 2019-04-21 ENCOUNTER — Ambulatory Visit (HOSPITAL_BASED_OUTPATIENT_CLINIC_OR_DEPARTMENT_OTHER): Payer: Medicare HMO | Admitting: Certified Registered Nurse Anesthetist

## 2019-04-21 ENCOUNTER — Other Ambulatory Visit: Payer: Self-pay

## 2019-04-21 ENCOUNTER — Ambulatory Visit (HOSPITAL_COMMUNITY): Payer: Medicare HMO

## 2019-04-21 ENCOUNTER — Ambulatory Visit (HOSPITAL_BASED_OUTPATIENT_CLINIC_OR_DEPARTMENT_OTHER)
Admission: RE | Admit: 2019-04-21 | Discharge: 2019-04-21 | Disposition: A | Discharge status: Home / Self Care | Payer: Medicare HMO | Attending: Otolaryngology | Admitting: Otolaryngology

## 2019-04-21 DIAGNOSIS — X58XXXA Exposure to other specified factors, initial encounter: Secondary | ICD-10-CM | POA: Diagnosis not present

## 2019-04-21 DIAGNOSIS — S00432A Contusion of left ear, initial encounter: Secondary | ICD-10-CM | POA: Insufficient documentation

## 2019-04-21 DIAGNOSIS — I471 Supraventricular tachycardia: Secondary | ICD-10-CM | POA: Diagnosis not present

## 2019-04-21 DIAGNOSIS — G809 Cerebral palsy, unspecified: Secondary | ICD-10-CM | POA: Diagnosis not present

## 2019-04-21 DIAGNOSIS — S00432D Contusion of left ear, subsequent encounter: Secondary | ICD-10-CM | POA: Diagnosis not present

## 2019-04-21 DIAGNOSIS — T81599A Other complications of foreign body accidentally left in body following unspecified procedure, initial encounter: Secondary | ICD-10-CM | POA: Diagnosis not present

## 2019-04-21 DIAGNOSIS — F840 Autistic disorder: Secondary | ICD-10-CM | POA: Insufficient documentation

## 2019-04-21 DIAGNOSIS — J309 Allergic rhinitis, unspecified: Secondary | ICD-10-CM | POA: Diagnosis not present

## 2019-04-21 DIAGNOSIS — G802 Spastic hemiplegic cerebral palsy: Secondary | ICD-10-CM | POA: Diagnosis not present

## 2019-04-21 DIAGNOSIS — H61122 Hematoma of pinna, left ear: Secondary | ICD-10-CM | POA: Diagnosis not present

## 2019-04-21 DIAGNOSIS — M795 Residual foreign body in soft tissue: Secondary | ICD-10-CM

## 2019-04-21 DIAGNOSIS — Z79899 Other long term (current) drug therapy: Secondary | ICD-10-CM | POA: Insufficient documentation

## 2019-04-21 DIAGNOSIS — R569 Unspecified convulsions: Secondary | ICD-10-CM | POA: Diagnosis not present

## 2019-04-21 HISTORY — PX: HEMATOMA EVACUATION: SHX5118

## 2019-04-21 HISTORY — DX: Cerebral palsy, unspecified: G80.9

## 2019-04-21 SURGERY — EVACUATION HEMATOMA
Anesthesia: General | Site: Ear | Laterality: Left

## 2019-04-21 MED ORDER — LACTATED RINGERS IV SOLN: INTRAVENOUS | Status: DC | PRN

## 2019-04-21 MED ORDER — PROPOFOL 10 MG/ML IV BOLUS
INTRAVENOUS | Status: AC
  Filled 2019-04-21: qty 20

## 2019-04-21 MED ORDER — FENTANYL CITRATE (PF) 100 MCG/2ML IJ SOLN
INTRAMUSCULAR | Status: DC | PRN
  Administered 2019-04-21: 50 ug via INTRAVENOUS
  Administered 2019-04-21 (×2): 25 ug via INTRAVENOUS

## 2019-04-21 MED ORDER — LACTATED RINGERS IV SOLN: INTRAVENOUS | Status: DC

## 2019-04-21 MED ORDER — CEFAZOLIN SODIUM-DEXTROSE 1-4 GM/50ML-% IV SOLN
INTRAVENOUS | Status: DC | PRN
  Administered 2019-04-21: 2 g via INTRAVENOUS

## 2019-04-21 MED ORDER — FENTANYL CITRATE (PF) 100 MCG/2ML IJ SOLN: 25.0000 ug | INTRAMUSCULAR | Status: DC | PRN

## 2019-04-21 MED ORDER — DEXAMETHASONE SODIUM PHOSPHATE 10 MG/ML IJ SOLN
INTRAMUSCULAR | Status: DC | PRN
  Administered 2019-04-21: 5 mg via INTRAVENOUS

## 2019-04-21 MED ORDER — ACETAMINOPHEN 160 MG/5ML PO SOLN: 325.0000 mg | Freq: Once | ORAL | Status: DC | PRN

## 2019-04-21 MED ORDER — FENTANYL CITRATE (PF) 100 MCG/2ML IJ SOLN: 50.0000 ug | INTRAMUSCULAR | Status: DC | PRN

## 2019-04-21 MED ORDER — BACITRACIN ZINC 500 UNIT/GM EX OINT
TOPICAL_OINTMENT | CUTANEOUS | Status: AC
  Filled 2019-04-21: qty 28.35

## 2019-04-21 MED ORDER — MIDAZOLAM HCL 2 MG/ML PO SYRP
ORAL_SOLUTION | ORAL | Status: AC
  Filled 2019-04-21: qty 10

## 2019-04-21 MED ORDER — OXYMETAZOLINE HCL 0.05 % NA SOLN
NASAL | Status: AC
  Filled 2019-04-21: qty 30

## 2019-04-21 MED ORDER — ACETAMINOPHEN 10 MG/ML IV SOLN: 1000.0000 mg | Freq: Once | INTRAVENOUS | Status: DC | PRN

## 2019-04-21 MED ORDER — SUGAMMADEX SODIUM 200 MG/2ML IV SOLN
INTRAVENOUS | Status: DC | PRN
  Administered 2019-04-21: 150 mg via INTRAVENOUS

## 2019-04-21 MED ORDER — EPHEDRINE SULFATE 50 MG/ML IJ SOLN
INTRAMUSCULAR | Status: DC | PRN
  Administered 2019-04-21: 5 mg via INTRAVENOUS

## 2019-04-21 MED ORDER — MIDAZOLAM HCL 2 MG/ML PO SYRP
20.0000 mg | ORAL_SOLUTION | Freq: Once | ORAL | Status: AC
  Administered 2019-04-21: 20 mg via ORAL

## 2019-04-21 MED ORDER — LIDOCAINE 2% (20 MG/ML) 5 ML SYRINGE
INTRAMUSCULAR | Status: DC | PRN
  Administered 2019-04-21: 40 mg via INTRAVENOUS

## 2019-04-21 MED ORDER — BACITRACIN 500 UNIT/GM EX OINT
TOPICAL_OINTMENT | CUTANEOUS | Status: DC | PRN
  Administered 2019-04-21: 1 via TOPICAL

## 2019-04-21 MED ORDER — ACETAMINOPHEN 325 MG PO TABS: 325.0000 mg | ORAL_TABLET | Freq: Once | ORAL | Status: DC | PRN

## 2019-04-21 MED ORDER — MIDAZOLAM HCL 2 MG/2ML IJ SOLN: 1.0000 mg | INTRAMUSCULAR | Status: DC | PRN

## 2019-04-21 MED ORDER — LIDOCAINE-EPINEPHRINE 1 %-1:100000 IJ SOLN
INTRAMUSCULAR | Status: AC
  Filled 2019-04-21: qty 1

## 2019-04-21 MED ORDER — BACITRACIN ZINC 500 UNIT/GM EX OINT
TOPICAL_OINTMENT | CUTANEOUS | Status: AC
  Filled 2019-04-21: qty 0.9

## 2019-04-21 MED ORDER — PROPOFOL 10 MG/ML IV BOLUS
INTRAVENOUS | Status: DC | PRN
  Administered 2019-04-21: 80 mg via INTRAVENOUS

## 2019-04-21 MED ORDER — FENTANYL CITRATE (PF) 100 MCG/2ML IJ SOLN
INTRAMUSCULAR | Status: AC
  Filled 2019-04-21: qty 2

## 2019-04-21 MED ORDER — PHENYLEPHRINE HCL (PRESSORS) 10 MG/ML IV SOLN
INTRAVENOUS | Status: DC | PRN
  Administered 2019-04-21: 80 ug via INTRAVENOUS

## 2019-04-21 MED ORDER — LIDOCAINE-EPINEPHRINE 1 %-1:100000 IJ SOLN
INTRAMUSCULAR | Status: DC | PRN
  Administered 2019-04-21: .2 mL

## 2019-04-21 MED ORDER — ONDANSETRON HCL 4 MG/2ML IJ SOLN
INTRAMUSCULAR | Status: DC | PRN
  Administered 2019-04-21: 4 mg via INTRAVENOUS

## 2019-04-21 MED ORDER — ROCURONIUM BROMIDE 100 MG/10ML IV SOLN
INTRAVENOUS | Status: DC | PRN
  Administered 2019-04-21: 40 mg via INTRAVENOUS

## 2019-04-21 SURGICAL SUPPLY — 63 items
APL SKNCLS STERI-STRIP NONHPOA (GAUZE/BANDAGES/DRESSINGS)
APL SWBSTK 6 STRL LF DISP (MISCELLANEOUS) ×1
APPLICATOR COTTON TIP 6 STRL (MISCELLANEOUS) IMPLANT
APPLICATOR COTTON TIP 6IN STRL (MISCELLANEOUS) ×2
BALL CTTN LRG ABS STRL LF (GAUZE/BANDAGES/DRESSINGS) ×1
BENZOIN TINCTURE PRP APPL 2/3 (GAUZE/BANDAGES/DRESSINGS) IMPLANT
BLADE SURG 15 STRL LF DISP TIS (BLADE) ×1 IMPLANT
BLADE SURG 15 STRL SS (BLADE) ×2
BNDG CONFORM 3 STRL LF (GAUZE/BANDAGES/DRESSINGS) IMPLANT
BNDG GAUZE ELAST 4 BULKY (GAUZE/BANDAGES/DRESSINGS) IMPLANT
CANISTER SUCT 1200ML W/VALVE (MISCELLANEOUS) ×1 IMPLANT
CLEANER CAUTERY TIP 5X5 PAD (MISCELLANEOUS) IMPLANT
CORD BIPOLAR FORCEPS 12FT (ELECTRODE) IMPLANT
COTTONBALL LRG STERILE PKG (GAUZE/BANDAGES/DRESSINGS) ×1 IMPLANT
COVER BACK TABLE 60X90IN (DRAPES) ×2 IMPLANT
COVER MAYO STAND STRL (DRAPES) ×2 IMPLANT
COVER WAND RF STERILE (DRAPES) IMPLANT
DECANTER SPIKE VIAL GLASS SM (MISCELLANEOUS) ×1 IMPLANT
DRAIN PENROSE 1/4X12 LTX STRL (WOUND CARE) IMPLANT
DRAPE U-SHAPE 76X120 STRL (DRAPES) ×2 IMPLANT
ELECT COATED BLADE 2.86 ST (ELECTRODE) IMPLANT
ELECT NDL BLADE 2-5/6 (NEEDLE) ×1 IMPLANT
ELECT NEEDLE BLADE 2-5/6 (NEEDLE) ×2 IMPLANT
ELECT REM PT RETURN 9FT ADLT (ELECTROSURGICAL) ×2
ELECTRODE REM PT RTRN 9FT ADLT (ELECTROSURGICAL) IMPLANT
GAUZE SPONGE 4X4 12PLY STRL LF (GAUZE/BANDAGES/DRESSINGS) IMPLANT
GLOVE BIO SURGEON STRL SZ7.5 (GLOVE) ×2 IMPLANT
GOWN STRL REUS W/ TWL LRG LVL3 (GOWN DISPOSABLE) ×2 IMPLANT
GOWN STRL REUS W/TWL LRG LVL3 (GOWN DISPOSABLE) ×4
NDL HYPO 25X1 1.5 SAFETY (NEEDLE) IMPLANT
NDL PRECISIONGLIDE 27X1.5 (NEEDLE) IMPLANT
NEEDLE HYPO 25X1 1.5 SAFETY (NEEDLE) IMPLANT
NEEDLE PRECISIONGLIDE 27X1.5 (NEEDLE) ×2 IMPLANT
NS IRRIG 1000ML POUR BTL (IV SOLUTION) ×2 IMPLANT
PACK BASIN DAY SURGERY FS (CUSTOM PROCEDURE TRAY) ×2 IMPLANT
PAD CLEANER CAUTERY TIP 5X5 (MISCELLANEOUS)
PENCIL SMOKE EVACUATOR (MISCELLANEOUS) ×2 IMPLANT
SHEET MEDIUM DRAPE 40X70 STRL (DRAPES) IMPLANT
SPONGE GAUZE 2X2 8PLY STRL LF (GAUZE/BANDAGES/DRESSINGS) IMPLANT
SPONGE LAP 4X18 RFD (DISPOSABLE) IMPLANT
STRIP CLOSURE SKIN 1/4X4 (GAUZE/BANDAGES/DRESSINGS) IMPLANT
SUCTION FRAZIER HANDLE 10FR (MISCELLANEOUS) ×2
SUCTION TUBE FRAZIER 10FR DISP (MISCELLANEOUS) ×1 IMPLANT
SUT CHROMIC 4 0 P 3 18 (SUTURE) IMPLANT
SUT ETHILON 2 0 FS 18 (SUTURE) ×5 IMPLANT
SUT ETHILON 4 0 PS 2 18 (SUTURE) IMPLANT
SUT ETHILON 5 0 P 3 18 (SUTURE)
SUT NYLON ETHILON 5-0 P-3 1X18 (SUTURE) IMPLANT
SUT PLAIN 5 0 P 3 18 (SUTURE) IMPLANT
SUT PROLENE 4 0 P 3 18 (SUTURE) IMPLANT
SUT PROLENE 5 0 P 3 (SUTURE) IMPLANT
SUT SILK 4 0 TIES 17X18 (SUTURE) IMPLANT
SUT VIC AB 4-0 P-3 18XBRD (SUTURE) IMPLANT
SUT VIC AB 4-0 P3 18 (SUTURE)
SUT VIC AB 5-0 P-3 18X BRD (SUTURE) IMPLANT
SUT VIC AB 5-0 P3 18 (SUTURE)
SWAB COLLECTION DEVICE MRSA (MISCELLANEOUS) IMPLANT
SWAB CULTURE ESWAB REG 1ML (MISCELLANEOUS) IMPLANT
SYR BULB 3OZ (MISCELLANEOUS) IMPLANT
SYR CONTROL 10ML LL (SYRINGE) ×2 IMPLANT
TOWEL GREEN STERILE FF (TOWEL DISPOSABLE) ×2 IMPLANT
TRAY DSU PREP LF (CUSTOM PROCEDURE TRAY) ×2 IMPLANT
TUBE CONNECTING 20X1/4 (TUBING) ×2 IMPLANT

## 2019-04-21 NOTE — Anesthesia Procedure Notes (Signed)
Procedure Name: Intubation Date/Time: 04/21/2019 1:56 PM Performed by: Lauralyn Primes, CRNA Pre-anesthesia Checklist: Patient identified, Emergency Drugs available, Suction available and Patient being monitored Patient Re-evaluated:Patient Re-evaluated prior to induction Oxygen Delivery Method: Circle system utilized Preoxygenation: Pre-oxygenation with 100% oxygen Induction Type: IV induction Ventilation: Mask ventilation without difficulty Laryngoscope Size: Miller and 2 Grade View: Grade I Tube type: Oral Tube size: 6.5 mm Number of attempts: 1 Airway Equipment and Method: Stylet and Bite block Placement Confirmation: ETT inserted through vocal cords under direct vision,  positive ETCO2 and breath sounds checked- equal and bilateral Secured at: 23 cm Tube secured with: Tape Dental Injury: Teeth and Oropharynx as per pre-operative assessment

## 2019-04-21 NOTE — H&P (Signed)
Jonathan [name] is an 24 y.o. male.   Chief Complaint:  Left auricular hematoma HPI: 7 year ole male with severe cognitive impairment who was treated in the ER for a left auricular hematoma on 3/19.  Unfortunately, the fluid pocket recurred so he presents for repeat drainage with plans for decortication within the pocket and a larger bolster dressing.  Past Medical History:  Diagnosis Date  . Allergic rhinitis   . Autism   . CP (cerebral palsy) (HCC)   . Mental retardation, idiopathic severe   . Pneumonia 06/2015  . Seizure (HCC) 08/24/2014    Past Surgical History:  Procedure Laterality Date  . NO PAST SURGERIES      Family History  Problem Relation Age of Onset  . Arthritis Paternal Grandmother   . Obesity Other   . Stroke Other   . Hypertension Other   . Seizures Maternal Uncle    Social History:  reports that he has never smoked. He has never used smokeless tobacco. He reports that he does not drink alcohol or use drugs.  Allergies: No Known Allergies  Medications Prior to Admission  Medication Sig Dispense Refill  . cetirizine (ZYRTEC) 1 MG/ML syrup GIVE 10ML'S BY MOUTH AT BEDTIME AS NEEDED FOR ALLERGY SYMPTOM CONTROL (Patient taking differently: Take 10 mg by mouth daily as needed. ) 240 mL 1  . ibuprofen (ADVIL,MOTRIN) 100 MG/5ML suspension Take 200 mg by mouth daily as needed for fever.     . levETIRAcetam (KEPPRA) 100 MG/ML solution Take 2.5 mLs (250 mg total) by mouth 2 (two) times daily. 450 mL 2  . diazepam (DIASAT) 20 MG GEL Place 20 mg rectally as needed. 1 Package 5    Results for orders placed or performed during the hospital encounter of 04/20/19 (from the past 48 hour(s))  SARS CORONAVIRUS 2 (TAT 6-24 HRS) Nasopharyngeal Nasopharyngeal Swab     Status: None   Collection Time: 04/20/19 10:47 AM   Specimen: Nasopharyngeal Swab  Result Value Ref Range   SARS Coronavirus 2 NEGATIVE NEGATIVE    Comment: (NOTE) SARS-CoV-2 target nucleic acids are NOT  DETECTED. The SARS-CoV-2 RNA is generally detectable in upper and lower respiratory specimens during the acute phase of infection. Negative results do not preclude SARS-CoV-2 infection, do not rule out co-infections with other pathogens, and should not be used as the sole basis for treatment or other patient management decisions. Negative results must be combined with clinical observations, patient history, and epidemiological information. The expected result is Negative. Fact Sheet for Patients: HairSlick.no Fact Sheet for Healthcare Providers: quierodirigir.com This test is not yet approved or cleared by the [place] FDA and  has been authorized for detection and/or diagnosis of SARS-CoV-2 by FDA under an Emergency Use Authorization (EUA). This EUA will remain  in effect (meaning this test can be used) for the duration of the COVID-19 declaration under Section 56 4(b)(1) of the Act, 21 U.S.C. section 360bbb-3(b)(1), unless the authorization is terminated or revoked sooner. Performed at [facility], [address] [place], [place] [address]    No results found.  Review of Systems  Unable to perform ROS: Patient nonverbal    Blood pressure 125/84, pulse 76, temperature 98.3 F (36.8 C), temperature source Oral, height 5' 9.5" (1.765 m), weight 53.5 kg, SpO2 96 %. Physical Exam  Constitutional: He appears well-developed and well-nourished.  Non-verbal.  HENT:  Head: Normocephalic and atraumatic.  Right Ear: External ear normal.  Nose: Nose normal.  Mouth/Throat: Oropharynx  is clear and moist.  Large left auricular hematoma.  Eyes: Pupils are equal, round, and reactive to light. Conjunctivae are normal.  Cardiovascular: Normal rate.  Respiratory: Effort normal.  Musculoskeletal:     Cervical back: Normal range of motion and neck supple.  Neurological: He is alert.  Skin: Skin is warm and dry.      Assessment/Plan Left auricular hematoma  To OR for repeat incision and drainage.  Melida Quitter, MD 04/21/2019, 1:22 PM

## 2019-04-21 NOTE — Discharge Instructions (Signed)

## 2019-04-21 NOTE — Op Note (Signed)
Jonathan [name], Jonathan [name] MEDICAL RECORD IF:[id number] ACCOUNT [id number] DATE OF BIRTH:Jul 20, 1995 FACILITY: [facility] LOCATION: [facility] PHYSICIAN:Lillymae Duet Pearletha Alfred, MD  OPERATIVE REPORT  DATE OF PROCEDURE:  04/21/2019  PREOPERATIVE DIAGNOSIS:  Left auricular hematoma.  POSTOPERATIVE DIAGNOSIS:  Left auricular hematoma.  PROCEDURE:  Incision and drainage of left auricular hematoma.  SURGEON:  Christia Reading, MD  ANESTHESIA:  General anesthesia.  COMPLICATIONS:  None.  INDICATIONS:  The patient is a 24 year old male with severe cognitive delay, who presented to the emergency department on 03/19 with a few days of swelling of the left auricle.  At that time, an auricular hematoma was diagnosed and incision and drainage  performed in the emergency department.  Unfortunately, he had recurrence of the hematoma and presents to the operating room today for a more aggressive incision and drainage approach.  FINDINGS:  There was dark viscous fluid within the hematoma space that was drained out.  DESCRIPTION OF PROCEDURE:  The patient was identified in the holding room, informed consent having been obtained with discussion of risks, benefits and alternatives.  The patient was brought to the operative suite and put on the operative table in supine  position.  Anesthesia was induced and the patient was intubated by the anesthesia team without difficulty.  The patient was given intravenous antibiotics during the case.  The left ear was prepped and draped in sterile fashion.  The site of the previous  incision was injected with 1% lidocaine with 1:100,000 of epinephrine.  The previous incision was then opened using scissors and blunt dissection and this was extended then into the hematoma cavity where fluid was then able to be expressed and allowing  decompression of the hematoma.  The internal surface of the hematoma cavity was then corticated using a rasp to improve healing.  At this point, the scaphoid  portion of the ear was bolstered using dental rolls on each side of the auricle and using  through-and-through sutures of 2-0 nylon in 4 sites to hold down the skin tissue.   After the dental rolls were all positioned and sutures tied, the external ear was coated with bacitracin ointment.  After an x-ray was performed due to a needle fragment being lost, he was extubated and taken to the recovery room in stable condition.  VN/NUANCE  D:04/21/2019 T:04/21/2019 JOB:[id number]

## 2019-04-21 NOTE — Anesthesia Preprocedure Evaluation (Addendum)
Anesthesia Evaluation  Patient identified by MRN, date of birth, ID band Patient awake    Reviewed: Allergy & Precautions, NPO status , Patient's Chart, lab work & pertinent test results, Unable to perform ROS - Chart review only  Airway Mallampati: II   Neck ROM: Full    Dental  (+) Dental Advisory Given   Pulmonary    Pulmonary exam normal        Cardiovascular negative cardio ROS   Rhythm:Regular Rate:Normal     Neuro/Psych Seizures -,  PSYCHIATRIC DISORDERS    GI/Hepatic negative GI ROS, Neg liver ROS,   Endo/Other  negative endocrine ROS  Renal/GU negative Renal ROS     Musculoskeletal negative musculoskeletal ROS (+)   Abdominal Normal abdominal exam  (+)   Peds  Hematology negative hematology ROS (+)   Anesthesia Other Findings   Reproductive/Obstetrics                            Anesthesia Physical Anesthesia Plan  ASA: II  Anesthesia Plan: General   Post-op Pain Management:    Induction: Intravenous  PONV Risk Score and Plan: 3 and Ondansetron, Treatment may vary due to age or medical condition and Midazolam  Airway Management Planned: Oral ETT  Additional Equipment: None  Intra-op Plan:   Post-operative Plan: Extubation in OR  Informed Consent: I have reviewed the patients History and Physical, chart, labs and discussed the procedure including the risks, benefits and alternatives for the proposed anesthesia with the patient or authorized representative who has indicated his/her understanding and acceptance.     Consent reviewed with POA  Plan Discussed with: CRNA  Anesthesia Plan Comments:         Anesthesia Quick Evaluation

## 2019-04-21 NOTE — Transfer of Care (Signed)
Immediate Anesthesia Transfer of Care Note  Patient: Jonathan [name]  Procedure(s) Performed: EVACUATION HEMATOMA left ear (Left Ear)  Patient Location: PACU  Anesthesia Type:General  Level of Consciousness: drowsy  Airway & Oxygen Therapy: Patient Spontanous Breathing and Patient connected to face mask oxygen  Post-op Assessment: Report given to RN and Post -op Vital signs reviewed and stable  Post vital signs: Reviewed and stable  Last Vitals:  Vitals Value Taken Time  BP 133/95 04/21/19 1504  Temp    Pulse 56 04/21/19 1512  Resp 15 04/21/19 1512  SpO2 100 % 04/21/19 1512  Vitals shown include unvalidated device data.  Last Pain:  Vitals:   04/21/19 1304  TempSrc: Oral         Complications: No apparent anesthesia complications

## 2019-04-21 NOTE — Anesthesia Postprocedure Evaluation (Signed)
Anesthesia Post Note  Patient: Jonathan [name]  Procedure(s) Performed: EVACUATION HEMATOMA left ear (Left Ear)     Patient location during evaluation: PACU Anesthesia Type: General Level of consciousness: awake and alert Pain management: pain level controlled Vital Signs Assessment: post-procedure vital signs reviewed and stable Respiratory status: spontaneous breathing, nonlabored ventilation, respiratory function stable and patient connected to nasal cannula oxygen Cardiovascular status: blood pressure returned to baseline and stable Postop Assessment: no apparent nausea or vomiting Anesthetic complications: no    Last Vitals:  Vitals:   04/21/19 1515 04/21/19 1543  BP: 115/78   Pulse: (!) 57 67  Resp: 15 18  Temp:  36.6 C  SpO2: 100% 100%    Last Pain:  Vitals:   04/21/19 1543  TempSrc:   PainSc: 0-No pain                 Shelton Silvas

## 2019-04-21 NOTE — Brief Op Note (Signed)
04/21/2019  2:52 PM  PATIENT:  Jonathan [name]  24 y.o. male  PRE-OPERATIVE DIAGNOSIS:  Hematoma of left ear  POST-OPERATIVE DIAGNOSIS:  Hematoma of left ear  PROCEDURE:  Procedure(s): EVACUATION HEMATOMA left ear (Left)  SURGEON:  Surgeon(s) and Role:    Christia Reading, MD - Primary  PHYSICIAN ASSISTANT:   ASSISTANTS: none   ANESTHESIA:   general  EBL:  5 mL   BLOOD ADMINISTERED:none  DRAINS: none   LOCAL MEDICATIONS USED:  LIDOCAINE   SPECIMEN:  No Specimen  DISPOSITION OF SPECIMEN:  N/A  COUNTS:  YES  TOURNIQUET:  * No tourniquets in log *  DICTATION: .Other Dictation: Dictation Number [id number]  PLAN OF CARE: Discharge to home after PACU  PATIENT DISPOSITION:  PACU - hemodynamically stable.   Delay start of Pharmacological VTE agent (>24hrs) due to surgical blood loss or risk of bleeding: yes

## 2019-04-22 ENCOUNTER — Encounter: Payer: Self-pay | Admitting: *Deleted

## 2019-05-13 DIAGNOSIS — R32 Unspecified urinary incontinence: Secondary | ICD-10-CM | POA: Diagnosis not present

## 2019-05-13 DIAGNOSIS — F840 Autistic disorder: Secondary | ICD-10-CM | POA: Diagnosis not present

## 2019-05-24 ENCOUNTER — Other Ambulatory Visit: Payer: Self-pay | Admitting: Neurology

## 2019-05-24 MED ORDER — LEVETIRACETAM 100 MG/ML PO SOLN: 250.0000 mg | Freq: Two times a day (BID) | ORAL | 2 refills | Status: DC

## 2019-06-14 DIAGNOSIS — F840 Autistic disorder: Secondary | ICD-10-CM | POA: Diagnosis not present

## 2019-06-14 DIAGNOSIS — R32 Unspecified urinary incontinence: Secondary | ICD-10-CM | POA: Diagnosis not present

## 2019-07-16 DIAGNOSIS — F840 Autistic disorder: Secondary | ICD-10-CM | POA: Diagnosis not present

## 2019-07-16 DIAGNOSIS — R32 Unspecified urinary incontinence: Secondary | ICD-10-CM | POA: Diagnosis not present

## 2019-07-22 DIAGNOSIS — F840 Autistic disorder: Secondary | ICD-10-CM | POA: Diagnosis not present

## 2019-07-22 DIAGNOSIS — R32 Unspecified urinary incontinence: Secondary | ICD-10-CM | POA: Diagnosis not present

## 2019-08-04 DIAGNOSIS — K0889 Other specified disorders of teeth and supporting structures: Secondary | ICD-10-CM | POA: Diagnosis not present

## 2019-08-04 DIAGNOSIS — L03211 Cellulitis of face: Secondary | ICD-10-CM | POA: Diagnosis not present

## 2019-08-10 DIAGNOSIS — R69 Illness, unspecified: Secondary | ICD-10-CM | POA: Diagnosis not present

## 2019-08-19 DIAGNOSIS — F840 Autistic disorder: Secondary | ICD-10-CM | POA: Diagnosis not present

## 2019-08-19 DIAGNOSIS — R32 Unspecified urinary incontinence: Secondary | ICD-10-CM | POA: Diagnosis not present

## 2019-08-20 DIAGNOSIS — F840 Autistic disorder: Secondary | ICD-10-CM | POA: Diagnosis not present

## 2019-08-20 DIAGNOSIS — R32 Unspecified urinary incontinence: Secondary | ICD-10-CM | POA: Diagnosis not present

## 2019-09-22 DIAGNOSIS — R32 Unspecified urinary incontinence: Secondary | ICD-10-CM | POA: Diagnosis not present

## 2019-09-22 DIAGNOSIS — F840 Autistic disorder: Secondary | ICD-10-CM | POA: Diagnosis not present

## 2019-09-27 DIAGNOSIS — F840 Autistic disorder: Secondary | ICD-10-CM | POA: Diagnosis not present

## 2019-09-27 DIAGNOSIS — R32 Unspecified urinary incontinence: Secondary | ICD-10-CM | POA: Diagnosis not present

## 2019-11-22 DIAGNOSIS — Z79899 Other long term (current) drug therapy: Secondary | ICD-10-CM | POA: Diagnosis not present

## 2019-11-22 DIAGNOSIS — E559 Vitamin D deficiency, unspecified: Secondary | ICD-10-CM | POA: Diagnosis not present

## 2019-11-22 DIAGNOSIS — Z23 Encounter for immunization: Secondary | ICD-10-CM | POA: Diagnosis not present

## 2019-11-22 DIAGNOSIS — Z2820 Immunization not carried out because of patient decision for unspecified reason: Secondary | ICD-10-CM | POA: Diagnosis not present

## 2019-11-22 DIAGNOSIS — Z0000 Encounter for general adult medical examination without abnormal findings: Secondary | ICD-10-CM | POA: Diagnosis not present

## 2019-11-22 DIAGNOSIS — F840 Autistic disorder: Secondary | ICD-10-CM | POA: Diagnosis not present

## 2019-11-22 DIAGNOSIS — M9512 Cauliflower ear, left ear: Secondary | ICD-10-CM | POA: Diagnosis not present

## 2019-11-23 DIAGNOSIS — R32 Unspecified urinary incontinence: Secondary | ICD-10-CM | POA: Diagnosis not present

## 2019-11-23 DIAGNOSIS — F840 Autistic disorder: Secondary | ICD-10-CM | POA: Diagnosis not present

## 2020-01-06 DIAGNOSIS — F840 Autistic disorder: Secondary | ICD-10-CM | POA: Diagnosis not present

## 2020-01-06 DIAGNOSIS — R32 Unspecified urinary incontinence: Secondary | ICD-10-CM | POA: Diagnosis not present

## 2020-02-14 ENCOUNTER — Other Ambulatory Visit: Payer: Self-pay | Admitting: Neurology

## 2020-02-17 ENCOUNTER — Ambulatory Visit: Payer: Medicare HMO | Admitting: Neurology

## 2020-05-19 ENCOUNTER — Other Ambulatory Visit: Payer: Self-pay | Admitting: Neurology

## 2020-06-05 ENCOUNTER — Other Ambulatory Visit: Payer: Self-pay | Admitting: Neurology

## 2020-07-19 DIAGNOSIS — J302 Other seasonal allergic rhinitis: Secondary | ICD-10-CM | POA: Diagnosis not present

## 2020-07-19 DIAGNOSIS — Z79899 Other long term (current) drug therapy: Secondary | ICD-10-CM | POA: Diagnosis not present

## 2020-07-19 DIAGNOSIS — G809 Cerebral palsy, unspecified: Secondary | ICD-10-CM | POA: Diagnosis not present

## 2020-07-21 DIAGNOSIS — G809 Cerebral palsy, unspecified: Secondary | ICD-10-CM | POA: Diagnosis not present

## 2020-07-21 DIAGNOSIS — R7989 Other specified abnormal findings of blood chemistry: Secondary | ICD-10-CM | POA: Diagnosis not present

## 2020-07-21 DIAGNOSIS — J302 Other seasonal allergic rhinitis: Secondary | ICD-10-CM | POA: Diagnosis not present

## 2020-08-02 DIAGNOSIS — G809 Cerebral palsy, unspecified: Secondary | ICD-10-CM | POA: Diagnosis not present

## 2020-08-02 DIAGNOSIS — Z0000 Encounter for general adult medical examination without abnormal findings: Secondary | ICD-10-CM | POA: Diagnosis not present

## 2020-08-02 DIAGNOSIS — R7989 Other specified abnormal findings of blood chemistry: Secondary | ICD-10-CM | POA: Diagnosis not present

## 2020-09-06 DIAGNOSIS — Z03818 Encounter for observation for suspected exposure to other biological agents ruled out: Secondary | ICD-10-CM | POA: Diagnosis not present

## 2020-09-06 DIAGNOSIS — Z20822 Contact with and (suspected) exposure to covid-19: Secondary | ICD-10-CM | POA: Diagnosis not present

## 2020-09-11 DIAGNOSIS — Z03818 Encounter for observation for suspected exposure to other biological agents ruled out: Secondary | ICD-10-CM | POA: Diagnosis not present

## 2020-09-11 DIAGNOSIS — Z20822 Contact with and (suspected) exposure to covid-19: Secondary | ICD-10-CM | POA: Diagnosis not present

## 2020-10-09 ENCOUNTER — Ambulatory Visit (INDEPENDENT_AMBULATORY_CARE_PROVIDER_SITE_OTHER): Payer: Medicare Other | Admitting: Adult Health

## 2020-10-09 VITALS — BP 140/91 | HR 88 | Ht 69.5 in | Wt 101.8 lb

## 2020-10-09 DIAGNOSIS — R569 Unspecified convulsions: Secondary | ICD-10-CM | POA: Diagnosis not present

## 2020-10-09 NOTE — Progress Notes (Deleted)
PATIENT: Jonathan [name] DOB: 08-Oct-1995  REASON FOR VISIT: follow up HISTORY FROM: patient  HISTORY OF PRESENT ILLNESS: Today 10/09/20: Jonathan [name] is a 25 year old male with a history of cerebral palsy and seizures.  He returns today for follow-up.  He is with his grandmother today.  She reports that his last seizure was in July 2020.  She states that this is when Keppra was restarted.  She states that since then he has not had any seizure events.  He continues to live at home with her.  She denies any further changes.  He returns today for follow-up.   HISTORY His grandmother reports that he had a seizure on 11 May 2017, was seen at the [facility]'s emergency room and got a refill for Diastat rectal Valium.  10 mg 1 dose.  The emergency room physician noted that the patient could move all extremities his face appeared symmetric he moved away from the examiner when his eyes were supposed to be examined.  There was no ongoing seizure-like activity during the hospital stay and he was discharged.  Medications did not change, he was not febrile, his blood pressure was 120/78 his pulse rate had temporarily been 105 the only physical symptom of a possible seizure.  He did have an EKG no significant change since last tracing was noted.  He had also a chest x-ray to make sure he does not have any kind of pneumonia.  It was not found to be abnormal.  The statement was that the patient had a breakthrough seizure likely due to stopping antiepileptic drugs.  He was in his usual state of health until prior to the seizure activity.  Back to baseline and discharged with caregiver who is his grandmother.  Unable to obtain a urine sample, Diastat was ordered 10 mg gel at 21 hours 23 minutes at night.  REVIEW OF SYSTEMS: Out of a complete 14 system review of symptoms, the patient complains only of the following symptoms, and all other reviewed systems are negative.  See HPI  ALLERGIES: No Known  Allergies  HOME MEDICATIONS: Outpatient Medications Prior to Visit  Medication Sig Dispense Refill   cetirizine (ZYRTEC) 1 MG/ML syrup GIVE 10ML'S BY MOUTH AT BEDTIME AS NEEDED FOR ALLERGY SYMPTOM CONTROL (Patient taking differently: Take 10 mg by mouth daily as needed. ) 240 mL 1   diazepam (DIASAT) 20 MG GEL Place 20 mg rectally as needed. 1 Package 5   ibuprofen (ADVIL,MOTRIN) 100 MG/5ML suspension Take 200 mg by mouth daily as needed for fever.      levETIRAcetam (KEPPRA) 100 MG/ML solution Take 2.5 mLs (250 mg total) by mouth 2 (two) times daily. CALL [phone] TO SCHEDULE FOLLOW UP FOR FUTURE REFILLS 150 mL 0   No facility-administered medications prior to visit.    PAST MEDICAL HISTORY: Past Medical History:  Diagnosis Date   Allergic rhinitis    Autism    CP (cerebral palsy) (HCC)    Mental retardation, idiopathic severe    Pneumonia 06/2015   Seizure (HCC) 08/24/2014    PAST SURGICAL HISTORY: Past Surgical History:  Procedure Laterality Date   HEMATOMA EVACUATION Left 04/21/2019   Procedure: EVACUATION HEMATOMA left ear;  Surgeon: Christia Reading, MD;  Location: [facility];  Service: ENT;  Laterality: Left;   NO PAST SURGERIES      FAMILY HISTORY: Family History  Problem Relation Age of Onset   Arthritis Paternal Grandmother    Obesity Other  Stroke Other    Hypertension Other    Seizures Maternal Uncle     SOCIAL HISTORY: Social History   Socioeconomic History   Marital status: Single    Spouse name: Not on file   Number of children: Not on file   Years of education: Not on file   Highest education level: Not on file  Occupational History   Not on file  Tobacco Use   Smoking status: Never   Smokeless tobacco: Never  Vaping Use   Vaping Use: Never used  Substance and Sexual Activity   Alcohol use: No   Drug use: No   Sexual activity: Never    Birth control/protection: Abstinence  Other Topics Concern   Not on file  Social  History Narrative   Not on file   Social Determinants of Health   Financial Resource Strain: Not on file  Food Insecurity: Not on file  Transportation Needs: Not on file  Physical Activity: Not on file  Stress: Not on file  Social Connections: Not on file  Intimate Partner Violence: Not on file      PHYSICAL EXAM  There were no vitals filed for this visit.  There is no height or weight on file to calculate BMI.  Generalized: Well developed, in no acute distress   Neurological examination  Mentation: Alert.  Does not follow any commands Cranial nerve II-XII: Pupils were equal round reactive to light.  Motor: The motor testing reveals 5 over 5 strength of all 4 extremities. Good symmetric motor tone is noted throughout.  Sensory: Unable to assess Coordination: Unable to assess Gait and station: Patient walks on his toes with a forced arch.   DIAGNOSTIC DATA (LABS, IMAGING, TESTING) - I reviewed patient records, labs, notes, testing and imaging myself where available.  Lab Results  Component Value Date   WBC 2.4 (L) 10/09/2018   HGB 14.9 10/09/2018   HCT 44.4 10/09/2018   MCV 85.2 10/09/2018   PLT 295 10/09/2018      Component Value Date/Time   NA 139 10/09/2018 1057   K 4.1 10/09/2018 1057   CL 102 10/09/2018 1057   CO2 29 10/09/2018 1057   GLUCOSE 75 10/09/2018 1057   BUN 8 10/09/2018 1057   CREATININE 0.75 10/09/2018 1057   CREATININE 0.64 03/17/2015 0954   CALCIUM 9.5 10/09/2018 1057   PROT 8.0 05/11/2017 1843   ALBUMIN 4.4 05/11/2017 1843   AST 21 05/11/2017 1843   ALT 18 05/11/2017 1843   ALKPHOS 64 05/11/2017 1843   BILITOT 0.8 05/11/2017 1843   GFRNONAA >60 10/09/2018 1057   GFRAA >60 10/09/2018 1057      ASSESSMENT AND PLAN 25 y.o. year old male  has a past medical history of Allergic rhinitis, Autism, CP (cerebral palsy) (HCC), Mental retardation, idiopathic severe, Pneumonia (06/2015), and Seizure (HCC) (08/24/2014). here with:  1.   Seizures  -Continue Keppra -Patient is getting reestablished with a primary care his grandmother asked that I refill Zyrtec for them.  I am amenable to this. -Advised if he has any seizure events he should let us know  Follow-up in 1 year or sooner if needed   I spent 15 minutes with the patient. 50% of this time was spent reviewing plan of care   Butch Penny, MSN, NP-C 10/09/2020, 2:04 PM [facility] [address] [place], [place] [address] [phone]

## 2020-10-09 NOTE — Progress Notes (Signed)
PATIENT: Jonathan [name] DOB: [birth year]  REASON FOR VISIT: follow up HISTORY FROM: patient PRIMARY NEUROLOGIST: Dr. Vickey Huger  HISTORY OF PRESENT ILLNESS: Today 10/09/20 Jonathan [name] is a 25 y.o. male with a history of cerebral palsy and seizures. He returns today for a follow up. Patient moved in with his guardian in June. Guardian reports that he was initially unaware of the patients seizure medication and the patient had three episodes in June. Once Jonathan [name] was restarted on the 250 mg Keppra twice a day, his seizures have stopped and there have been no other episodes since. His guardian denies any other changes at this time.    HISTORY 02/24/19: Jonathan [name] is a 25 year old male with a history of cerebral palsy and seizures.  He returns today for follow-up.  He is with his grandmother today.  She reports that his last seizure was in July 2020.  She states that this is when Keppra was restarted.  She states that since then he has not had any seizure events.  He continues to live at home with her.  She denies any further changes.  He returns today for follow-up.  REVIEW OF SYSTEMS: Out of a complete 14 system review of symptoms, the patient complains only of the following symptoms, and all other reviewed systems are negative.  See HPI  ALLERGIES: No Known Allergies  HOME MEDICATIONS: Outpatient Medications Prior to Visit  Medication Sig Dispense Refill   cetirizine (ZYRTEC) 1 MG/ML syrup GIVE 10ML'S BY MOUTH AT BEDTIME AS NEEDED FOR ALLERGY SYMPTOM CONTROL (Patient taking differently: Take 10 mg by mouth daily as needed. ) 240 mL 1   diazepam (DIASAT) 20 MG GEL Place 20 mg rectally as needed. 1 Package 5   ibuprofen (ADVIL,MOTRIN) 100 MG/5ML suspension Take 200 mg by mouth daily as needed for fever.      levETIRAcetam (KEPPRA) 100 MG/ML solution Take 2.5 mLs (250 mg total) by mouth 2 (two) times daily. CALL [phone] TO SCHEDULE FOLLOW UP FOR FUTURE REFILLS 150 mL 0   No  facility-administered medications prior to visit.    PAST MEDICAL HISTORY: Past Medical History:  Diagnosis Date   Allergic rhinitis    Autism    CP (cerebral palsy) (HCC)    Mental retardation, idiopathic severe    Pneumonia 06/2015   Seizure (HCC) 08/24/2014    PAST SURGICAL HISTORY: Past Surgical History:  Procedure Laterality Date   HEMATOMA EVACUATION Left 04/21/2019   Procedure: EVACUATION HEMATOMA left ear;  Surgeon: Christia Reading, MD;  Location: [facility];  Service: ENT;  Laterality: Left;   NO PAST SURGERIES      FAMILY HISTORY: Family History  Problem Relation Age of Onset   Arthritis Paternal Grandmother    Obesity Other    Stroke Other    Hypertension Other    Seizures Maternal Uncle     SOCIAL HISTORY: Social History   Socioeconomic History   Marital status: Single    Spouse name: Not on file   Number of children: Not on file   Years of education: Not on file   Highest education level: Not on file  Occupational History   Not on file  Tobacco Use   Smoking status: Never   Smokeless tobacco: Never  Vaping Use   Vaping Use: Never used  Substance and Sexual Activity   Alcohol use: No   Drug use: No   Sexual activity: Never    Birth control/protection: Abstinence  Other Topics Concern  Not on file  Social History Narrative   Not on file   Social Determinants of Health   Financial Resource Strain: Not on file  Food Insecurity: Not on file  Transportation Needs: Not on file  Physical Activity: Not on file  Stress: Not on file  Social Connections: Not on file  Intimate Partner Violence: Not on file    PHYSICAL EXAM  Vitals:   10/09/20 1407  BP: (!) 140/91  Pulse: 88  Weight: 101 lb 12.8 oz (46.2 kg)  Height: 5' 9.5" (1.765 m)   Body mass index is 14.82 kg/m.  Generalized: Well developed, in no acute distress   Neurological examination  Mentation: Alert. Does not follow any commands Cranial nerve II-XII: Pupils  were equal round reactive to light. Motor: The motor testing reveals 5 over 5 strength of all 4 extremities. Good symmetric motor tone is noted throughout.  Sensory: UTA Coordination: UTA Gait and station: patient walks on his toes with a forced arch   DIAGNOSTIC DATA (LABS, IMAGING, TESTING) - I reviewed patient records, labs, notes, testing and imaging myself where available.  Lab Results  Component Value Date   WBC 2.4 (L) 10/09/2018   HGB 14.9 10/09/2018   HCT 44.4 10/09/2018   MCV 85.2 10/09/2018   PLT 295 10/09/2018      Component Value Date/Time   NA 139 10/09/2018 1057   K 4.1 10/09/2018 1057   CL 102 10/09/2018 1057   CO2 29 10/09/2018 1057   GLUCOSE 75 10/09/2018 1057   BUN 8 10/09/2018 1057   CREATININE 0.75 10/09/2018 1057   CREATININE 0.64 03/17/2015 0954   CALCIUM 9.5 10/09/2018 1057   PROT 8.0 05/11/2017 1843   ALBUMIN 4.4 05/11/2017 1843   AST 21 05/11/2017 1843   ALT 18 05/11/2017 1843   ALKPHOS 64 05/11/2017 1843   BILITOT 0.8 05/11/2017 1843   GFRNONAA >60 10/09/2018 1057   GFRAA >60 10/09/2018 1057     ASSESSMENT AND PLAN 25 y.o. year old male  has a past medical history of Allergic rhinitis, Autism, CP (cerebral palsy) (HCC), Mental retardation, idiopathic severe, Pneumonia (06/2015), and Seizure (HCC) (08/24/2014). here with    1: Seizures - Continue Keppra 250 mg twice a day  - Advised if he has any seizure events he should let us know - Follow-up in 6 months or sooner if needed    Butch Penny, MSN, NP-C 10/09/2020, 2:07 PM [facility] [address], [address] [place], [place] [address] [phone]

## 2020-11-01 DIAGNOSIS — Z23 Encounter for immunization: Secondary | ICD-10-CM | POA: Diagnosis not present

## 2020-11-01 DIAGNOSIS — G4700 Insomnia, unspecified: Secondary | ICD-10-CM | POA: Diagnosis not present

## 2020-11-23 ENCOUNTER — Ambulatory Visit: Payer: Medicare Other | Admitting: Adult Health

## 2021-04-09 ENCOUNTER — Ambulatory Visit (INDEPENDENT_AMBULATORY_CARE_PROVIDER_SITE_OTHER): Payer: Medicare Other | Admitting: Adult Health

## 2021-04-09 VITALS — BP 134/87 | HR 90 | Ht 69.5 in | Wt 104.0 lb

## 2021-04-09 DIAGNOSIS — R569 Unspecified convulsions: Secondary | ICD-10-CM

## 2021-04-09 NOTE — Progress Notes (Signed)
? ? ?PATIENT: Jonathan [name] ?DOB: [birth year] ? ?REASON FOR VISIT: follow up ?HISTORY FROM: patient ?PRIMARY NEUROLOGIST: Dr. Brett Fairy ? ?HISTORY OF PRESENT ILLNESS: ?Today 04/09/21: ? ?Jonathan [name] is a 26 year old male with a history of seizures.  He returns today for follow-up.  He is here today with his caregiver.  They deny any seizure events.  He remains on Keppra 250 mg twice a day.  ? ?10/09/20: Jonathan [name] is a 26 y.o. male with a history of cerebral palsy and seizures. He returns today for a follow up. Patient moved in with his guardian in June. Guardian reports that he was initially unaware of the patients seizure medication and the patient had three episodes in June. Once Jonathan [name] was restarted on the 250 mg Keppra twice a day, his seizures have stopped and there have been no other episodes since. His guardian denies any other changes at this time.  ? ? ?HISTORY ?02/24/19: Jonathan [name] is a 26 year old male with a history of cerebral palsy and seizures.  He returns today for follow-up.  He is with his grandmother today.  She reports that his last seizure was in July 2020.  She states that this is when Keppra was restarted.  She states that since then he has not had any seizure events.  He continues to live at home with her.  She denies any further changes.  He returns today for follow-up. ? ?REVIEW OF SYSTEMS: Out of a complete 14 system review of symptoms, the patient complains only of the following symptoms, and all other reviewed systems are negative. ? ?See HPI ? ?ALLERGIES: ?No Known Allergies ? ?HOME MEDICATIONS: ?Outpatient Medications Prior to Visit  ?Medication Sig Dispense Refill  ? cetirizine (ZYRTEC) 1 MG/ML syrup GIVE 10ML'S BY MOUTH AT BEDTIME AS NEEDED FOR ALLERGY SYMPTOM CONTROL (Patient taking differently: Take 10 mg by mouth daily as needed.) 240 mL 1  ? diazepam (DIASAT) 20 MG GEL Place 20 mg rectally as needed. 1 Package 5  ? ibuprofen (ADVIL,MOTRIN) 100 MG/5ML suspension Take 200 mg by mouth  daily as needed for fever.     ? levETIRAcetam (KEPPRA) 100 MG/ML solution Take 2.5 mLs (250 mg total) by mouth 2 (two) times daily. CALL [phone] TO SCHEDULE FOLLOW UP FOR FUTURE REFILLS 150 mL 0  ? azelastine (OPTIVAR) 0.05 % ophthalmic solution Place 1 drop into both eyes daily.    ? ?No facility-administered medications prior to visit.  ? ? ?PAST MEDICAL HISTORY: ?Past Medical History:  ?Diagnosis Date  ? Allergic rhinitis   ? Autism   ? CP (cerebral palsy) ([place])   ? Mental retardation, idiopathic severe   ? Pneumonia 06/2015  ? Seizure ([place]) 08/24/2014  ? ? ?PAST SURGICAL HISTORY: ?Past Surgical History:  ?Procedure Laterality Date  ? HEMATOMA EVACUATION Left 04/21/2019  ? Procedure: EVACUATION HEMATOMA left ear;  Surgeon: Melida Quitter, MD;  Location: [place];  Service: ENT;  Laterality: Left;  ? NO PAST SURGERIES    ? ? ?FAMILY HISTORY: ?Family History  ?Problem Relation Age of Onset  ? Arthritis Paternal Grandmother   ? Obesity Other   ? Stroke Other   ? Hypertension Other   ? Seizures Maternal Uncle   ? ? ?SOCIAL HISTORY: ?Social History  ? ?Socioeconomic History  ? Marital status: Single  ?  Spouse name: Not on file  ? Number of children: Not on file  ? Years of education: Not on file  ? Highest education level: Not on file  ?  Occupational History  ? Not on file  ?Tobacco Use  ? Smoking status: Never  ? Smokeless tobacco: Never  ?Vaping Use  ? Vaping Use: Never used  ?Substance and Sexual Activity  ? Alcohol use: No  ? Drug use: No  ? Sexual activity: Never  ?  Birth control/protection: Abstinence  ?Other Topics Concern  ? Not on file  ?Social History Narrative  ? Not on file  ? ?Social Determinants of Health  ? ?Financial Resource Strain: Not on file  ?Food Insecurity: Not on file  ?Transportation Needs: Not on file  ?Physical Activity: Not on file  ?Stress: Not on file  ?Social Connections: Not on file  ?Intimate Partner Violence: Not on file  ? ? ?PHYSICAL EXAM ? ?Vitals:  ? 04/09/21  1436  ?BP: 134/87  ?Pulse: 90  ?Weight: 104 lb (47.2 kg)  ?Height: 5' 9.5" (1.765 m)  ? ?Body mass index is 15.14 kg/m?. ? ?Generalized: Well developed, in no acute distress  ? ?Neurological examination  ?Mentation: Alert. Does not follow any commands ?Cranial nerve II-XII: Pupils were equal round reactive to light.  ?Motor: The motor testing reveals 5 over 5 strength of all 4 extremities. Good symmetric motor tone is noted throughout.  ?Sensory: UTA ?Coordination: UTA ?Gait and station: patient walks on his toes with a forced arch ? ? ?DIAGNOSTIC DATA (LABS, IMAGING, TESTING) ?- I reviewed patient records, labs, notes, testing and imaging myself where available. ? ?Lab Results  ?Component Value Date  ? WBC 2.4 (L) 10/09/2018  ? HGB 14.9 10/09/2018  ? HCT 44.4 10/09/2018  ? MCV 85.2 10/09/2018  ? PLT 295 10/09/2018  ? ?   ?Component Value Date/Time  ? NA 139 10/09/2018 1057  ? K 4.1 10/09/2018 1057  ? CL 102 10/09/2018 1057  ? CO2 29 10/09/2018 1057  ? GLUCOSE 75 10/09/2018 1057  ? BUN 8 10/09/2018 1057  ? CREATININE 0.75 10/09/2018 1057  ? CREATININE 0.64 03/17/2015 0954  ? CALCIUM 9.5 10/09/2018 1057  ? PROT 8.0 05/11/2017 1843  ? ALBUMIN 4.4 05/11/2017 1843  ? AST 21 05/11/2017 1843  ? ALT 18 05/11/2017 1843  ? ALKPHOS 64 05/11/2017 1843  ? BILITOT 0.8 05/11/2017 1843  ? GFRNONAA >60 10/09/2018 1057  ? GFRAA >60 10/09/2018 1057  ? ? ? ?ASSESSMENT AND PLAN ?26 y.o. year old male  has a past medical history of Allergic rhinitis, Autism, CP (cerebral palsy) ([place]), Mental retardation, idiopathic severe, Pneumonia (06/2015), and Seizure ([employer]) (08/24/2014). here with  ? ? ?1: Seizures ? ?- Continue Keppra 250 mg twice a day  ?- Advised if he has any seizure events he should let us know ?- Follow-up in 1 year or sooner if needed. ? ? ? ?Ward Givens, MSN, NP-C 04/09/2021, 2:39 PM ?[facility] ?[address], [address] ?[place], [place] [address] ?[phone] ?  ?

## 2021-04-09 NOTE — Patient Instructions (Signed)
Your Plan: ? ?Continue Keppra 250 mg twice a day ?Call if he has any seizure events ?If your symptoms worsen or you develop new symptoms please let us know.  ? ? ?Thank you for coming to see [place] at [facility]. I hope we have been able to provide you high quality care today. ? ?You may receive a patient satisfaction survey over the next few weeks. We would appreciate your feedback and comments so that we may continue to improve ourselves and the health of our patients. ? ? ?

## 2021-06-15 DIAGNOSIS — G4700 Insomnia, unspecified: Secondary | ICD-10-CM | POA: Diagnosis not present

## 2021-06-15 DIAGNOSIS — G809 Cerebral palsy, unspecified: Secondary | ICD-10-CM | POA: Diagnosis not present

## 2021-08-08 DIAGNOSIS — Z136 Encounter for screening for cardiovascular disorders: Secondary | ICD-10-CM | POA: Diagnosis not present

## 2021-08-08 DIAGNOSIS — Z139 Encounter for screening, unspecified: Secondary | ICD-10-CM | POA: Diagnosis not present

## 2021-08-08 DIAGNOSIS — Z1159 Encounter for screening for other viral diseases: Secondary | ICD-10-CM | POA: Diagnosis not present

## 2021-08-08 DIAGNOSIS — H6123 Impacted cerumen, bilateral: Secondary | ICD-10-CM | POA: Diagnosis not present

## 2021-08-08 DIAGNOSIS — Z0001 Encounter for general adult medical examination with abnormal findings: Secondary | ICD-10-CM | POA: Diagnosis not present

## 2021-08-08 DIAGNOSIS — G809 Cerebral palsy, unspecified: Secondary | ICD-10-CM | POA: Diagnosis not present

## 2021-11-15 DIAGNOSIS — J3489 Other specified disorders of nose and nasal sinuses: Secondary | ICD-10-CM | POA: Diagnosis not present

## 2021-11-15 DIAGNOSIS — U071 COVID-19: Secondary | ICD-10-CM | POA: Diagnosis not present

## 2021-11-15 DIAGNOSIS — Z681 Body mass index (BMI) 19 or less, adult: Secondary | ICD-10-CM | POA: Diagnosis not present

## 2021-11-21 DIAGNOSIS — J069 Acute upper respiratory infection, unspecified: Secondary | ICD-10-CM | POA: Diagnosis not present

## 2021-11-21 DIAGNOSIS — U071 COVID-19: Secondary | ICD-10-CM | POA: Diagnosis not present

## 2021-12-14 DIAGNOSIS — Z23 Encounter for immunization: Secondary | ICD-10-CM | POA: Diagnosis not present

## 2021-12-14 DIAGNOSIS — J309 Allergic rhinitis, unspecified: Secondary | ICD-10-CM | POA: Diagnosis not present

## 2021-12-14 DIAGNOSIS — G809 Cerebral palsy, unspecified: Secondary | ICD-10-CM | POA: Diagnosis not present

## 2022-01-11 ENCOUNTER — Emergency Department (HOSPITAL_COMMUNITY): Payer: Medicare Other

## 2022-01-11 ENCOUNTER — Other Ambulatory Visit: Payer: Self-pay

## 2022-01-11 ENCOUNTER — Encounter (HOSPITAL_COMMUNITY): Payer: Self-pay | Admitting: *Deleted

## 2022-01-11 ENCOUNTER — Inpatient Hospital Stay (HOSPITAL_COMMUNITY)
Admission: EM | Admit: 2022-01-11 | Discharge: 2022-01-13 | DRG: 101 | Disposition: A | Discharge status: Home / Self Care | Payer: Medicare Other | Attending: Internal Medicine | Admitting: Internal Medicine

## 2022-01-11 DIAGNOSIS — G40401 Other generalized epilepsy and epileptic syndromes, not intractable, with status epilepticus: Secondary | ICD-10-CM | POA: Diagnosis not present

## 2022-01-11 DIAGNOSIS — J309 Allergic rhinitis, unspecified: Secondary | ICD-10-CM | POA: Diagnosis present

## 2022-01-11 DIAGNOSIS — N179 Acute kidney failure, unspecified: Secondary | ICD-10-CM | POA: Diagnosis not present

## 2022-01-11 DIAGNOSIS — G809 Cerebral palsy, unspecified: Secondary | ICD-10-CM | POA: Diagnosis not present

## 2022-01-11 DIAGNOSIS — G9340 Encephalopathy, unspecified: Secondary | ICD-10-CM | POA: Diagnosis not present

## 2022-01-11 DIAGNOSIS — R569 Unspecified convulsions: Secondary | ICD-10-CM

## 2022-01-11 DIAGNOSIS — F79 Unspecified intellectual disabilities: Secondary | ICD-10-CM | POA: Diagnosis present

## 2022-01-11 DIAGNOSIS — E8729 Other acidosis: Secondary | ICD-10-CM | POA: Diagnosis present

## 2022-01-11 DIAGNOSIS — Z79899 Other long term (current) drug therapy: Secondary | ICD-10-CM

## 2022-01-11 DIAGNOSIS — R519 Headache, unspecified: Secondary | ICD-10-CM | POA: Diagnosis not present

## 2022-01-11 DIAGNOSIS — G9349 Other encephalopathy: Secondary | ICD-10-CM | POA: Diagnosis not present

## 2022-01-11 DIAGNOSIS — M6282 Rhabdomyolysis: Secondary | ICD-10-CM | POA: Diagnosis present

## 2022-01-11 DIAGNOSIS — H547 Unspecified visual loss: Secondary | ICD-10-CM | POA: Diagnosis not present

## 2022-01-11 DIAGNOSIS — R000 Tachycardia, unspecified: Secondary | ICD-10-CM | POA: Diagnosis not present

## 2022-01-11 DIAGNOSIS — G40901 Epilepsy, unspecified, not intractable, with status epilepticus: Principal | ICD-10-CM

## 2022-01-11 DIAGNOSIS — D649 Anemia, unspecified: Secondary | ICD-10-CM | POA: Diagnosis not present

## 2022-01-11 DIAGNOSIS — Z8249 Family history of ischemic heart disease and other diseases of the circulatory system: Secondary | ICD-10-CM | POA: Diagnosis not present

## 2022-01-11 DIAGNOSIS — Z823 Family history of stroke: Secondary | ICD-10-CM

## 2022-01-11 DIAGNOSIS — E872 Acidosis, unspecified: Secondary | ICD-10-CM | POA: Diagnosis not present

## 2022-01-11 DIAGNOSIS — F840 Autistic disorder: Secondary | ICD-10-CM | POA: Diagnosis present

## 2022-01-11 DIAGNOSIS — R0902 Hypoxemia: Secondary | ICD-10-CM | POA: Diagnosis not present

## 2022-01-11 DIAGNOSIS — R404 Transient alteration of awareness: Secondary | ICD-10-CM | POA: Diagnosis not present

## 2022-01-11 LAB — URINALYSIS, ROUTINE W REFLEX MICROSCOPIC
Bilirubin Urine: NEGATIVE
Glucose, UA: NEGATIVE mg/dL
Hgb urine dipstick: NEGATIVE
Ketones, ur: NEGATIVE mg/dL
Leukocytes,Ua: NEGATIVE
Nitrite: NEGATIVE
Protein, ur: NEGATIVE mg/dL
Specific Gravity, Urine: 1.01 (ref 1.005–1.030)
pH: 5 (ref 5.0–8.0)

## 2022-01-11 LAB — COMPREHENSIVE METABOLIC PANEL
ALT: 50 U/L — ABNORMAL HIGH (ref 0–44)
AST: 95 U/L — ABNORMAL HIGH (ref 15–41)
Albumin: 4.1 g/dL (ref 3.5–5.0)
Alkaline Phosphatase: 44 U/L (ref 38–126)
Anion gap: 19 — ABNORMAL HIGH (ref 5–15)
BUN: 12 mg/dL (ref 6–20)
CO2: 19 mmol/L — ABNORMAL LOW (ref 22–32)
Calcium: 8.8 mg/dL — ABNORMAL LOW (ref 8.9–10.3)
Chloride: 103 mmol/L (ref 98–111)
Creatinine, Ser: 1.33 mg/dL — ABNORMAL HIGH (ref 0.61–1.24)
GFR, Estimated: >60 mL/min (ref >60)
Glucose, Bld: 137 mg/dL — ABNORMAL HIGH (ref 70–99)
Potassium: 3.8 mmol/L (ref 3.5–5.1)
Sodium: 141 mmol/L (ref 135–145)
Total Bilirubin: 0.2 mg/dL — ABNORMAL LOW (ref 0.3–1.2)
Total Protein: 7 g/dL (ref 6.5–8.1)

## 2022-01-11 LAB — CBC
HCT: 39.4 % (ref 39.0–52.0)
Hemoglobin: 12.7 g/dL — ABNORMAL LOW (ref 13.0–17.0)
MCH: 27.4 pg (ref 26.0–34.0)
MCHC: 32.2 g/dL (ref 30.0–36.0)
MCV: 85.1 fL (ref 80.0–100.0)
Platelets: 240 10*3/uL (ref 150–400)
RBC: 4.63 MIL/uL (ref 4.22–5.81)
RDW: 14.3 % (ref 11.5–15.5)
WBC: 10.2 10*3/uL (ref 4.0–10.5)
nRBC: 0 % (ref 0.0–0.2)

## 2022-01-11 LAB — I-STAT VENOUS BLOOD GAS, ED
Acid-base deficit: 7 mmol/L — ABNORMAL HIGH (ref 0.0–2.0)
Bicarbonate: 19.5 mmol/L — ABNORMAL LOW (ref 20.0–28.0)
Calcium, Ion: 1.13 mmol/L — ABNORMAL LOW (ref 1.15–1.40)
HCT: 41 % (ref 39.0–52.0)
Hemoglobin: 13.9 g/dL (ref 13.0–17.0)
O2 Saturation: 75 %
Potassium: 3.7 mmol/L (ref 3.5–5.1)
Sodium: 141 mmol/L (ref 135–145)
TCO2: 21 mmol/L — ABNORMAL LOW (ref 22–32)
pCO2, Ven: 43 mmHg — ABNORMAL LOW (ref 44–60)
pH, Ven: 7.265 (ref 7.25–7.43)
pO2, Ven: 46 mmHg — ABNORMAL HIGH (ref 32–45)

## 2022-01-11 LAB — CBG MONITORING, ED: Glucose-Capillary: 168 mg/dL — ABNORMAL HIGH (ref 70–99)

## 2022-01-11 MED ORDER — LEVETIRACETAM IN NACL 1000 MG/100ML IV SOLN
1000.0000 mg | Freq: Once | INTRAVENOUS | Status: AC
  Administered 2022-01-11: 1000 mg via INTRAVENOUS
  Filled 2022-01-11: qty 100

## 2022-01-11 MED ORDER — LORAZEPAM 2 MG/ML IJ SOLN
1.0000 mg | Freq: Once | INTRAMUSCULAR | Status: AC
  Administered 2022-01-11: 1 mg via INTRAVENOUS
  Filled 2022-01-11: qty 1

## 2022-01-11 NOTE — Consult Note (Signed)
Neurology Consultation  Reason for Consult: Seizure Referring Physician: Dr. Margarita Grizzle  CC: Seizures  History is obtained from: Chart, patient's caregiver  HPI: Jonathan [name] is a 26 y.o. male past medical history of cerebral palsy and seizures, autism and severe developmental delay, patient of [facility] on Keppra 250 twice daily with good control of seizure baseline with missed dose of Keppra a few days ago now presenting to the emergency room after he had seizures that started at 6 PM and continued to he was brought to the emergency room at 8 PM.  EMS gave him Versed and he was given Ativan in the emergency room.  He was also loaded with 1 g of Keppra.  No missed doses except for 1 dose that was missed 3 days ago-the nighttime dose at that day. No recent sicknesses or illnesses.  No sick contacts. Patient was nonverbal at baseline.  He is able to clap and communicate at some point  Chart review reveals that he had a significant birth history where his mother suffered from sepsis during pregnancy and died after meningitis and she was brain dead but left on life support measures as she was pregnant and Jonathan [name] was delivered at gestational age of [redacted] weeks.  His mother had been comatose since the fifth week of pregnancy.  He weighed 2 pounds and 2 ounces at birth.  He had a long NICU stay and was released home then.  At 26 year of age he had a bout of pneumonia.  He never became fully verbal and remains nonverbal since but produces sounds and has intraoperative vocalizations.  He learned crawling at 52 to 39 months of age and walking at 70 months of age but was always tiptoeing.  He also had visual impairment at baseline but it is unclear how severe that is on chart review.  Chart review also reveals that for the past many years he has either stayed stable or worsened in terms of his spasticity and limbs.  He was earlier able to follow some commands but that has also become worse over repeated of  time.  He was on multiple seizure medications at 1 point and it was discontinued and then finally put on Keppra with a dose to 50 twice daily according to my review of the chart.   ROS: Unable to obtain due to altered mental status.   Past Medical History:  Diagnosis Date   Allergic rhinitis    Autism    CP (cerebral palsy) (HCC)    Mental retardation, idiopathic severe    Pneumonia 06/2015   Seizure (HCC) 08/24/2014   ***  Family History  Problem Relation Age of Onset   Arthritis Paternal Grandmother    Obesity Other    Stroke Other    Hypertension Other    Seizures Maternal Uncle    ***  Social History:   reports that he has never smoked. He has never used smokeless tobacco. He reports that he does not drink alcohol and does not use drugs. *** Medications No current facility-administered medications for this encounter.  Current Outpatient Medications:    cetirizine (ZYRTEC) 1 MG/ML syrup, GIVE 10ML'S BY MOUTH AT BEDTIME AS NEEDED FOR ALLERGY SYMPTOM CONTROL (Patient taking differently: Take 10 mg by mouth daily as needed.), Disp: 240 mL, Rfl: 1   diazepam (DIASAT) 20 MG GEL, Place 20 mg rectally as needed., Disp: 1 Package, Rfl: 5   ibuprofen (ADVIL,MOTRIN) 100 MG/5ML suspension, Take 200 mg by mouth daily as  needed for fever. , Disp: , Rfl:    levETIRAcetam (KEPPRA) 100 MG/ML solution, Take 2.5 mLs (250 mg total) by mouth 2 (two) times daily. CALL [phone] TO SCHEDULE FOLLOW UP FOR FUTURE REFILLS (Patient taking differently: Take 250 mg by mouth 2 (two) times daily.), Disp: 150 mL, Rfl: 0 ***  Exam: Current vital signs: BP 101/70   Pulse (!) 103   Temp 98.3 F (36.8 C) (Rectal)   Resp 13   SpO2 100%  Vital signs in last 24 hours: Temp:  [98.3 F (36.8 C)] 98.3 F (36.8 C) (12/15 2059) Pulse Rate:  [103-122] 103 (12/15 2100) Resp:  [13-23] 13 (12/15 2100) BP: (101-115)/(64-104) 101/70 (12/15 2100) SpO2:  [91 %-100 %] 100 % (12/15 2100) General: Slender  appearing man who appears somewhat emaciated, he is obtunded, no distress HEENT: Normocephalic, atraumatic Lungs: Clear Cardiovascular regular rhythm Abdomen nondistended nontender Neurological exam Obtunded Does not open eyes to voice Does not follow any commands Pupils are equal round reactive to light, with roving eye movements. Does not been to threat from either side Has no gaze preference or deviation No facial asymmetry To noxious stimulation, minimal movement in the right upper extremity but withdraws the other 3 limbs much stronger. No spontaneous movement noted     Labs I have reviewed labs in epic and the results pertinent to this consultation are: *** CBC    Component Value Date/Time   WBC 10.2 01/11/2022 2025   RBC 4.63 01/11/2022 2025   HGB 13.9 01/11/2022 2036   HCT 41.0 01/11/2022 2036   PLT 240 01/11/2022 2025   MCV 85.1 01/11/2022 2025   MCH 27.4 01/11/2022 2025   MCHC 32.2 01/11/2022 2025   RDW 14.3 01/11/2022 2025   LYMPHSABS 0.7 05/11/2017 1843   MONOABS 0.3 05/11/2017 1843   EOSABS 0.0 05/11/2017 1843   BASOSABS 0.0 05/11/2017 1843    CMP     Component Value Date/Time   NA 141 01/11/2022 2036   K 3.7 01/11/2022 2036   CL 103 01/11/2022 2025   CO2 19 (L) 01/11/2022 2025   GLUCOSE 137 (H) 01/11/2022 2025   BUN 12 01/11/2022 2025   CREATININE 1.33 (H) 01/11/2022 2025   CREATININE 0.64 03/17/2015 0954   CALCIUM 8.8 (L) 01/11/2022 2025   PROT 7.0 01/11/2022 2025   ALBUMIN 4.1 01/11/2022 2025   AST 95 (H) 01/11/2022 2025   ALT 50 (H) 01/11/2022 2025   ALKPHOS 44 01/11/2022 2025   BILITOT 0.2 (L) 01/11/2022 2025   GFRNONAA >60 01/11/2022 2025   GFRAA >60 10/09/2018 1057    Imaging I have reviewed the images obtained:  CT-head-ordered and pending  Assessment: 26 year old with autism, severe cerebral palsy, developmental delay, history of seizures presenting with flurry of seizures that happened this evening.  Does not appear to be back  to baseline.  Concern for ongoing status epilepticus. Clinically does not appear to be seizing and appears more encephalopathic with obtunded mentation and roving eye movements on exam.  The only concerning finding on exam is less withdrawal to noxious stimulation on the right upper extremity in comparison to the other 3 extremities. Stat CT head imaging ordered Will obtain stat EEG He has been loaded with Keppra 1 g IV in the ER.   Impression: Breakthrough seizure, possible status epilepticus-known history of seizures  Recommendations: Agree with the Keppra load of 1 g Increase standing doses of Keppra to 500 mg twice daily Maintain seizure precautions Obtain CT head stat.   --  Milon Dikes, MD Neurologist [facility] Pager: [phone]  CRITICAL CARE ATTESTATION Performed by: Milon Dikes, MD Total critical care time: 35 minutes Critical care time was exclusive of separately billable procedures and treating other patients and/or supervising APPs/Residents/Students Critical care was necessary to treat or prevent imminent or life-threatening deterioration. This patient is critically ill and at significant risk for neurological worsening and/or death and care requires constant monitoring. Critical care was time spent personally by me on the following activities: development of treatment plan with patient and/or surrogate as well as nursing, discussions with consultants, evaluation of patient's response to treatment, examination of patient, obtaining history from patient or surrogate, ordering and performing treatments and interventions, ordering and review of laboratory studies, ordering and review of radiographic studies, pulse oximetry, re-evaluation of patient's condition, participation in multidisciplinary rounds and medical decision making of high complexity in the care of this patient.

## 2022-01-11 NOTE — ED Provider Notes (Signed)
11:10 PM Assumed care from Dr. Rosalia Hammers, please see their note for full history, physical and decision making until this point. In brief this is a 26 y.o. year old male who presented to the ED tonight with Seizures     Status after missing a dose a couple days ago. Has had versed/ativan/keppra. Improved motor but still somnolent. Jonathan [name] has seen, wants CT 2/2 questionable paresis. Admit.   Discharge instructions, including strict return precautions for new or worsening symptoms, given. Patient and/or family verbalized understanding and agreement with the plan as described.   Labs, studies and imaging reviewed by myself and considered in medical decision making if ordered. Imaging interpreted by radiology.  Labs Reviewed  CBC - Abnormal; Notable for the following components:      Result Value   Hemoglobin 12.7 (*)    All other components within normal limits  COMPREHENSIVE METABOLIC PANEL - Abnormal; Notable for the following components:   CO2 19 (*)    Glucose, Bld 137 (*)    Creatinine, Ser 1.33 (*)    Calcium 8.8 (*)    AST 95 (*)    ALT 50 (*)    Total Bilirubin 0.2 (*)    Anion gap 19 (*)    All other components within normal limits  CBG MONITORING, ED - Abnormal; Notable for the following components:   Glucose-Capillary 168 (*)    All other components within normal limits  I-STAT VENOUS BLOOD GAS, ED - Abnormal; Notable for the following components:   pCO2, Ven 43.0 (*)    pO2, Ven 46 (*)    Bicarbonate 19.5 (*)    TCO2 21 (*)    Acid-base deficit 7.0 (*)    Calcium, Ion 1.13 (*)    All other components within normal limits  CULTURE, BLOOD (ROUTINE X 2)  CULTURE, BLOOD (ROUTINE X 2)  URINALYSIS, ROUTINE W REFLEX MICROSCOPIC  BLOOD GAS, VENOUS    DG Chest Port 1 View    (Results Pending)  CT Head Wo Contrast    (Results Pending)    No follow-ups on file.

## 2022-01-11 NOTE — ED Provider Notes (Signed)
[facility] EMERGENCY DEPARTMENT Provider Note   CSN: [id number] Arrival date & time: 01/11/22  2005     History  Chief Complaint  Patient presents with   Seizures    Jonathan [name] is a 26 y.o. male.  HPI Level 5 caveat secondary to patient's seizures and nonverbal state Initial history obtained from EMS Patient reported to be in his normal state which is nonverbal but awake and alert and clapping until 6 PM when he began having a grand mal seizure.  He had a intermittent grand mal seizures for over an hour and had several seizures upon EMS arrival.  He was never returned to baseline in the interim.  He received some IM Versed by EMS prehospital.  Family reports that he has been taking his Keppra with the exception of missing a dose 2 days ago due to moving.  No reports of recent illness     Home Medications Prior to Admission medications   Medication Sig Start Date End Date Taking? Authorizing Provider  cetirizine (ZYRTEC) 1 MG/ML syrup GIVE 10ML'S BY MOUTH AT BEDTIME AS NEEDED FOR ALLERGY SYMPTOM CONTROL Patient taking differently: Take 10 mg by mouth daily as needed. 05/17/16   Maree Erie, MD  diazepam (DIASAT) 20 MG GEL Place 20 mg rectally as needed. 08/12/18   Dohmeier, Porfirio Mylar, MD  ibuprofen (ADVIL,MOTRIN) 100 MG/5ML suspension Take 200 mg by mouth daily as needed for fever.  01/01/16   [provider]  levETIRAcetam (KEPPRA) 100 MG/ML solution Take 2.5 mLs (250 mg total) by mouth 2 (two) times daily. CALL [phone] TO SCHEDULE FOLLOW UP FOR FUTURE REFILLS Patient taking differently: Take 250 mg by mouth 2 (two) times daily. 05/22/20   Dohmeier, Porfirio Mylar, MD      Allergies    Patient has no known allergies.    Review of Systems   Review of Systems  Physical Exam Updated Vital Signs There were no vitals taken for this visit. Physical Exam Vitals and nursing note reviewed.  Constitutional:      Comments: Patient is not alert eyes are  closed  HENT:     Head: Atraumatic.     Right Ear: External ear normal.     Left Ear: External ear normal.     Nose: Nose normal.     Mouth/Throat:     Comments: Oral secretions are noted not able to open patient's mouth Eyes:     Comments: Eyes are deviated to right  Cardiovascular:     Rate and Rhythm: Regular rhythm. Tachycardia present.     Pulses: Normal pulses.  Pulmonary:     Effort: Pulmonary effort is normal.  Abdominal:     Palpations: Abdomen is soft.  Musculoskeletal:     Cervical back: Normal range of motion.     Comments: Upper extremities.  Has some contractures and he has toes pointed bilaterally  Skin:    General: Skin is warm and dry.     Capillary Refill: Capillary refill takes less than 2 seconds.  Neurological:     Comments: Patient has intermittent shaking consistent with grand mal seizure and has intermittent episodes of nausea and shaking but has not open eyes or done any purposeful movement     ED Results / Procedures / Treatments   Labs (all labs ordered are listed, but only abnormal results are displayed) Labs Reviewed - No data to display  EKG None  Radiology No results found.  Procedures Procedures    Medications Ordered  in ED Medications - No data to display  ED Course/ Medical Decision Making/ A&P Clinical Course as of 01/11/22 2229  Fri Jan 11, 2022  2050 VBG reviewed interpreted pH is significant for 7.26 pCO2 of 43 [DR]  2220 Complete metabolic panel reviewed and interpreted and is significant for CO2 decreased at 19 consistent with the patient's acidosis and some increase of creatinine to 1.33 [DR]  2220 CBC(!) BC reviewed interpreted within normal limits [DR]  2227 Chest x-Venda Dice reviewed and interpreted with no acute abnormalities and radiologist interpretation concurs [DR]    Clinical Course User Index [DR] Margarita Grizzle, MD                           Medical Decision Making 27 year old male history of developmental delay  with known seizure disorder presents today with status epilepticus. Differential diagnosis includes but is not limited to noncompliance with medication, acute viral or bacterial infection, other electrolyte abnormalities and other etiologies that could lower seizure threshold CBC, complete metabolic panel, blood cultures ordered 2 mg of Ativan and Keppra 1 g IV have been ordered Venous blood gas is being ordered to assess for pH and respiratory status Will add chest x-Justen Fonda Patient has had resolution of seizure activity since initial evaluation after Ativan and Keppra given. Care discussed with Dr. Jerrell Belfast who is agrees with plan for Keppra.  And will see in consult Patient pending head CT. If no acute abnormality on head CT patient will need admission for ongoing valuation until he is back to baseline and seizures are controlled  Amount and/or Complexity of Data Reviewed Labs: ordered. Decision-making details documented in ED Course. Radiology: ordered and independent interpretation performed. Decision-making details documented in ED Course. Discussion of management or test interpretation with external provider(s): Care discussed with Dr. Jerrell Belfast  Risk Prescription drug management. Parenteral controlled substances.  Critical Care Total time providing critical care: 60 minutes           Final Clinical Impression(s) / ED Diagnoses Final diagnoses:  None    Rx / DC Orders ED Discharge Orders     None         Margarita Grizzle, MD 01/14/22 [phone]

## 2022-01-11 NOTE — ED Triage Notes (Signed)
Pt arrived from home with [facility] for seizures. Pt at baseline is autistic and nonverbal, hx of seizures. Per family, pt has been having consistent seizures since 1800, seizing on EMS arrival with large amounts of oral secretions, given 5mg  IM versed. BP dropped from 140/82 to 70s systolic. 20g IV LAC

## 2022-01-11 NOTE — Progress Notes (Signed)
Stat LTM Hooked up  and running -however pt is going to CT.

## 2022-01-12 ENCOUNTER — Encounter (HOSPITAL_COMMUNITY): Payer: Self-pay | Admitting: Internal Medicine

## 2022-01-12 DIAGNOSIS — D649 Anemia, unspecified: Secondary | ICD-10-CM | POA: Diagnosis not present

## 2022-01-12 DIAGNOSIS — M6282 Rhabdomyolysis: Secondary | ICD-10-CM | POA: Diagnosis present

## 2022-01-12 DIAGNOSIS — G9349 Other encephalopathy: Secondary | ICD-10-CM | POA: Diagnosis present

## 2022-01-12 DIAGNOSIS — E8729 Other acidosis: Secondary | ICD-10-CM | POA: Diagnosis present

## 2022-01-12 DIAGNOSIS — F79 Unspecified intellectual disabilities: Secondary | ICD-10-CM | POA: Diagnosis present

## 2022-01-12 DIAGNOSIS — G40401 Other generalized epilepsy and epileptic syndromes, not intractable, with status epilepticus: Secondary | ICD-10-CM | POA: Diagnosis present

## 2022-01-12 DIAGNOSIS — H547 Unspecified visual loss: Secondary | ICD-10-CM | POA: Diagnosis present

## 2022-01-12 DIAGNOSIS — G9340 Encephalopathy, unspecified: Secondary | ICD-10-CM | POA: Diagnosis present

## 2022-01-12 DIAGNOSIS — R569 Unspecified convulsions: Secondary | ICD-10-CM | POA: Diagnosis not present

## 2022-01-12 DIAGNOSIS — G40901 Epilepsy, unspecified, not intractable, with status epilepticus: Secondary | ICD-10-CM | POA: Diagnosis present

## 2022-01-12 DIAGNOSIS — J309 Allergic rhinitis, unspecified: Secondary | ICD-10-CM | POA: Diagnosis present

## 2022-01-12 DIAGNOSIS — E872 Acidosis, unspecified: Secondary | ICD-10-CM | POA: Diagnosis present

## 2022-01-12 DIAGNOSIS — Z79899 Other long term (current) drug therapy: Secondary | ICD-10-CM | POA: Diagnosis not present

## 2022-01-12 DIAGNOSIS — F840 Autistic disorder: Secondary | ICD-10-CM | POA: Diagnosis present

## 2022-01-12 DIAGNOSIS — Z823 Family history of stroke: Secondary | ICD-10-CM | POA: Diagnosis not present

## 2022-01-12 DIAGNOSIS — N179 Acute kidney failure, unspecified: Secondary | ICD-10-CM | POA: Diagnosis not present

## 2022-01-12 DIAGNOSIS — G809 Cerebral palsy, unspecified: Secondary | ICD-10-CM | POA: Diagnosis present

## 2022-01-12 DIAGNOSIS — R519 Headache, unspecified: Secondary | ICD-10-CM | POA: Diagnosis not present

## 2022-01-12 DIAGNOSIS — Z8249 Family history of ischemic heart disease and other diseases of the circulatory system: Secondary | ICD-10-CM | POA: Diagnosis not present

## 2022-01-12 LAB — CBC WITH DIFFERENTIAL/PLATELET
Abs Immature Granulocytes: 0.02 10*3/uL (ref 0.00–0.07)
Basophils Absolute: 0 10*3/uL (ref 0.0–0.1)
Basophils Relative: 0 %
Eosinophils Absolute: 0 10*3/uL (ref 0.0–0.5)
Eosinophils Relative: 0 %
HCT: 35.5 % — ABNORMAL LOW (ref 39.0–52.0)
Hemoglobin: 12.3 g/dL — ABNORMAL LOW (ref 13.0–17.0)
Immature Granulocytes: 0 %
Lymphocytes Relative: 16 %
Lymphs Abs: 1.3 10*3/uL (ref 0.7–4.0)
MCH: 28.3 pg (ref 26.0–34.0)
MCHC: 34.6 g/dL (ref 30.0–36.0)
MCV: 81.8 fL (ref 80.0–100.0)
Monocytes Absolute: 0.4 10*3/uL (ref 0.1–1.0)
Monocytes Relative: 5 %
Neutro Abs: 6.3 10*3/uL (ref 1.7–7.7)
Neutrophils Relative %: 79 %
Platelets: 208 10*3/uL (ref 150–400)
RBC: 4.34 MIL/uL (ref 4.22–5.81)
RDW: 14.4 % (ref 11.5–15.5)
WBC: 8.1 10*3/uL (ref 4.0–10.5)
nRBC: 0 % (ref 0.0–0.2)

## 2022-01-12 LAB — COMPREHENSIVE METABOLIC PANEL
ALT: 45 U/L — ABNORMAL HIGH (ref 0–44)
AST: 63 U/L — ABNORMAL HIGH (ref 15–41)
Albumin: 3.8 g/dL (ref 3.5–5.0)
Alkaline Phosphatase: 40 U/L (ref 38–126)
Anion gap: 8 (ref 5–15)
BUN: 9 mg/dL (ref 6–20)
CO2: 25 mmol/L (ref 22–32)
Calcium: 8.6 mg/dL — ABNORMAL LOW (ref 8.9–10.3)
Chloride: 107 mmol/L (ref 98–111)
Creatinine, Ser: 1.16 mg/dL (ref 0.61–1.24)
GFR, Estimated: >60 mL/min (ref >60)
Glucose, Bld: 70 mg/dL (ref 70–99)
Potassium: 3.5 mmol/L (ref 3.5–5.1)
Sodium: 140 mmol/L (ref 135–145)
Total Bilirubin: 0.5 mg/dL (ref 0.3–1.2)
Total Protein: 6.5 g/dL (ref 6.5–8.1)

## 2022-01-12 LAB — C DIFFICILE QUICK SCREEN W PCR REFLEX
C Diff antigen: NEGATIVE
C Diff interpretation: NOT DETECTED
C Diff toxin: NEGATIVE

## 2022-01-12 LAB — MAGNESIUM: Magnesium: 2 mg/dL (ref 1.7–2.4)

## 2022-01-12 MED ORDER — LORAZEPAM 2 MG/ML IJ SOLN
2.0000 mg | Freq: Once | INTRAMUSCULAR | Status: DC
  Filled 2022-01-12: qty 1

## 2022-01-12 MED ORDER — SODIUM CHLORIDE 0.9 % IV BOLUS
500.0000 mL | Freq: Once | INTRAVENOUS | Status: AC
  Administered 2022-01-12: 500 mL via INTRAVENOUS

## 2022-01-12 MED ORDER — ACETAMINOPHEN 650 MG RE SUPP: 650.0000 mg | Freq: Four times a day (QID) | RECTAL | Status: DC | PRN

## 2022-01-12 MED ORDER — ENOXAPARIN SODIUM 30 MG/0.3ML IJ SOSY
30.0000 mg | PREFILLED_SYRINGE | INTRAMUSCULAR | Status: DC
  Administered 2022-01-12 – 2022-01-13 (×2): 30 mg via SUBCUTANEOUS
  Filled 2022-01-12 (×2): qty 0.3

## 2022-01-12 MED ORDER — SODIUM CHLORIDE 0.9 % IV SOLN: Freq: Once | INTRAVENOUS | Status: AC

## 2022-01-12 MED ORDER — LEVETIRACETAM IN NACL 500 MG/100ML IV SOLN
500.0000 mg | Freq: Two times a day (BID) | INTRAVENOUS | Status: DC
  Administered 2022-01-12: 500 mg via INTRAVENOUS
  Filled 2022-01-12: qty 100

## 2022-01-12 MED ORDER — SODIUM CHLORIDE 0.9 % IV BOLUS
1000.0000 mL | Freq: Once | INTRAVENOUS | Status: AC
  Administered 2022-01-12: 1000 mL via INTRAVENOUS

## 2022-01-12 MED ORDER — SODIUM CHLORIDE 0.9 % IV SOLN
2000.0000 mg | Freq: Once | INTRAVENOUS | Status: AC
  Administered 2022-01-12: 2000 mg via INTRAVENOUS
  Filled 2022-01-12: qty 20

## 2022-01-12 MED ORDER — LORAZEPAM 2 MG/ML IJ SOLN
2.0000 mg | Freq: Once | INTRAMUSCULAR | Status: AC
  Administered 2022-01-12: 2 mg via INTRAVENOUS
  Filled 2022-01-12: qty 1

## 2022-01-12 MED ORDER — ACETAMINOPHEN 325 MG PO TABS: 650.0000 mg | ORAL_TABLET | Freq: Four times a day (QID) | ORAL | Status: DC | PRN

## 2022-01-12 MED ORDER — LEVETIRACETAM IN NACL 1000 MG/100ML IV SOLN
1000.0000 mg | Freq: Two times a day (BID) | INTRAVENOUS | Status: DC
  Administered 2022-01-12 – 2022-01-13 (×2): 1000 mg via INTRAVENOUS
  Filled 2022-01-12 (×2): qty 100

## 2022-01-12 NOTE — Progress Notes (Signed)
26 year old gentleman with history of autism who is mute from bipolar and history of seizure disorder on Keppra to 50 mg p.o. twice daily at home was brought into the emergency department after breakthrough seizure.  He was loaded with Keppra and Keppra dose has been increased to 500 mg p.o. twice daily.  Neurology has consulted.  Patient is getting continuous EEG.  Patient seen and examined in the ED.  Caregiver at the bedside.  Patient appears slightly lethargic.  It is very challenging to know how he is feeling since he is mute.  Will defer further management to neurology.  Will likely need overnight stay to complete 24 hours of EEG.  Please refer to detailed H&P by the admitting hospitalist for details.

## 2022-01-12 NOTE — ED Notes (Signed)
EEG still in progress

## 2022-01-12 NOTE — Progress Notes (Addendum)
Neurology Progress Note   S:// Family at bedside. Patient is laying in the fetal position. Per family member at the bedside, he is developmentally delayed with developmental age about 22-9 months old. At baseline he does not follow commands and does not speak. Per family at the bedside he had about 4 seizures at home prior to coming in to the ED for evaluation, with the longest being 10 minutes. She states that he did not miss any doses of medications. He does have several seizures per month, per his mother, but was brought in for evaluation due to recently increased frequency of seizures, which is unusual for him.   LTM EEG with Electrographic status epilepticus, generalized  Spike,generalized, Continuous slow, generalized.  He was loaded with 2000mg  IV Keppra and maintenance dose increased to 1000 mg IV BID.   On exam he is in the fetal position, non verbal,does not follow commands, blinks bilaterally to threat, withdraws in all 4 extremities.   O:// Current vital signs: BP 95/60   Pulse 88   Temp 98 F (36.7 C) (Oral)   Resp 11   SpO2 98%  Vital signs in last 24 hours: Temp:  [97.9 F (36.6 C)-98.3 F (36.8 C)] 98 F (36.7 C) (12/16 1747) Pulse Rate:  [71-122] 88 (12/16 1745) Resp:  [0-23] 11 (12/16 1745) BP: (66-122)/(36-104) 95/60 (12/16 1745) SpO2:  [91 %-100 %] 98 % (12/16 1745)  GENERAL: lethargic, critically ill HEENT: - Normocephalic and atraumatic, dry mm LUNGS - Clear to auscultation bilaterally with no wheezes CV - S1S2 RRR, no m/r/g, equal pulses bilaterally. ABDOMEN - Soft, nontender, nondistended with normoactive BS Ext: warm, well perfused, intact peripheral pulses, no edema  NEURO:  Mental Status: eyes open, is nonverbal at baseline and does not follow commands. PERRL, blinks to threat bilaterally, no facial asymmetry. Purses lips and makes stereotyped pouting/sputtering/razzing sounds which mother states is his normal baseline.  Motor: Withdraws to painful  stimuli in all 4 extremities  Sensation- responds to irritative stimuli x 4 Coordination: Unable to assess  Gait- Unable to assess   Medications  Current Facility-Administered Medications:    acetaminophen (TYLENOL) tablet 650 mg, 650 mg, Oral, Q6H PRN **OR** acetaminophen (TYLENOL) suppository 650 mg, 650 mg, Rectal, Q6H PRN, Howerter, Justin B, DO   enoxaparin (LOVENOX) injection 30 mg, 30 mg, Subcutaneous, Q24H, Howerter, Justin B, DO, 30 mg at 01/12/22 1017   levETIRAcetam (KEPPRA) IVPB 1000 mg/100 mL premix, 1,000 mg, Intravenous, Q12H, 01/14/22, MD  Current Outpatient Medications:    cetirizine (ZYRTEC) 1 MG/ML syrup, GIVE 10ML'S BY MOUTH AT BEDTIME AS NEEDED FOR ALLERGY SYMPTOM CONTROL (Patient taking differently: Take 10 mg by mouth daily as needed.), Disp: 240 mL, Rfl: 1   levETIRAcetam (KEPPRA) 100 MG/ML solution, Take 2.5 mLs (250 mg total) by mouth 2 (two) times daily. CALL [phone] TO SCHEDULE FOLLOW UP FOR FUTURE REFILLS (Patient taking differently: Take 250 mg by mouth 2 (two) times daily.), Disp: 150 mL, Rfl: 0   diazepam (DIASAT) 20 MG GEL, Place 20 mg rectally as needed. (Patient not taking: Reported on 01/12/2022), Disp: 1 Package, Rfl: 5  Labs CBC    Component Value Date/Time   WBC 8.1 01/12/2022 0415   RBC 4.34 01/12/2022 0415   HGB 12.3 (L) 01/12/2022 0415   HCT 35.5 (L) 01/12/2022 0415   PLT 208 01/12/2022 0415   MCV 81.8 01/12/2022 0415   MCH 28.3 01/12/2022 0415   MCHC 34.6 01/12/2022 0415   RDW 14.4 01/12/2022 0415  LYMPHSABS 1.3 01/12/2022 0415   MONOABS 0.4 01/12/2022 0415   EOSABS 0.0 01/12/2022 0415   BASOSABS 0.0 01/12/2022 0415    CMP     Component Value Date/Time   NA 140 01/12/2022 0415   K 3.5 01/12/2022 0415   CL 107 01/12/2022 0415   CO2 25 01/12/2022 0415   GLUCOSE 70 01/12/2022 0415   BUN 9 01/12/2022 0415   CREATININE 1.16 01/12/2022 0415   CREATININE 0.64 03/17/2015 0954   CALCIUM 8.6 (L) 01/12/2022 0415   PROT 6.5  01/12/2022 0415   ALBUMIN 3.8 01/12/2022 0415   AST 63 (H) 01/12/2022 0415   ALT 45 (H) 01/12/2022 0415   ALKPHOS 40 01/12/2022 0415   BILITOT 0.5 01/12/2022 0415   GFRNONAA >60 01/12/2022 0415   GFRAA >60 10/09/2018 1057    Lipid Panel  No results found for: "CHOL", "TRIG", "HDL", "CHOLHDL", "VLDL", "LDLCALC", "LDLDIRECT"   Imaging I have reviewed images in epic and the results pertinent to this consultation are:  CT-scan of the brain no acute process   LTM EEG 12/15-12/16:  - Electrographic status epilepticus, generalized  - Spike, generalized - Continuous slow, generalized This study initially showed evidence of epileptogenicity with generalized onset. The spikes at time appear periodic at 1hz  and concerning for ictal-interictal continuum. After around 10 am on 11/12/2021, EEG worsened and was consistent with electrographic status epilepticus with generalized onset. Additionally, there is moderate diffuse encephalopathy, nonspecific to etiology.   Assessment:  Jonathan [name] is a 26 y.o. male with a past medical history of cerebral palsy, seizures, autism and severe developmental delay, nonverbal at baseline, completely dependent on family for care, patient of [facility] on Keppra 250 twice daily with good control of seizures at baseline, with missed dose of Keppra a few days ago, now presenting to the emergency room after he had seizures that started at 6 PM and were continuing at the time he was brought to the emergency room by EMS at 8 PM.  EMS gave him Versed and he was given Ativan in the emergency room.  He was also loaded with 1 g of Keppra.  No missed doses except for 1 dose that was missed 3 days ago - his nighttime dose. No recent illnesses.  No sick contacts.  Impression: Break through seizures with status epilepticus in a severely developmentally delayed patient with a long-standing history of seizures   Recommendations: - Seizure precautions  - continue LTM  -  Keppra 2000 mg IV x 1 STAT (already given) - Increasing scheduled Keppra dose to 1000 mg IV BID - Ativan 2 mg IV x 1 STAT - Neurology will continue to follow and review EEG for improvement after additional Keppra loading dose  34 DNP, ACNPC-AG  [facility]  Addendum: - Bedside review of EEG at 7:00 PM 12/16: Diffuse slowing with low voltage is seen with waxing/waning beta activity consistent with sedation. Also noted are occasional short 2-3 second runs of left sided rhythmic activity not evolving to electrographic seizures.  - Will continue Keppra at the increased dose of 1000 mg IV BID - Images from the patient's MRI obtained in 2016 were personally reviewed. 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   Electronically signed: Dr. 2017

## 2022-01-12 NOTE — Progress Notes (Signed)
Trouble shoot equipment this morning so that Provider can see EEG. Pt was transferred to 004c and not put back online

## 2022-01-12 NOTE — H&P (Signed)
History and Physical      Jonathan [name] SHF:[id number] DOB: [birth year] DOA: 01/11/2022  PCP: Maryagnes Amos, FNP  Patient coming from: home   I have personally briefly reviewed patient's old medical records in [facility]  Chief Complaint: Seizures  HPI: Jonathan [name] is a 26 y.o. male with medical history significant for cerebral palsy, developmental delay, seizures, who is admitted to [facility] on 01/11/2022 with breakthrough seizures after presenting from home to [facility] ED for evaluation of seizures.   In the setting of the patient's current altered mental status, following history is provided by the patient's caregiver, who is present at bedside, in addition to my discussions with the EDP in the interview.  Patient had exhibited evidence of generalized tonic-clonic activity at home earlier in the day, prompting EMS to be called.  En  route to the emergency department, and received iv versed as well ativan via ems.  Patient's caregiver, resident bedside conveys that the patient is on Keppra as a sole outpatient antiepileptic medication and that he may have recently missed a single dose of Keppra but is otherwise very compliant and routinely taking this medication.  No recent trauma.  No recent fever.  He conveys that the patient has no history of alcohol abuse or any history of recreational drug use, including no use of stimulants.  On-call neurology consulted upon arrival in the ED, with Dr. Wilford Corner formally consulting,  t with recommendation eeg monitoring,as well as Keppra load as well as increase in dose of Keppra from 250 mg twice daily to 500 mg twice daily.  Additionally, neurology noted on physical exam the patient exhibited evidence of right upper extremity weakness, prompting recommendation for CT head.     ED Course:  Vital signs in the ED were notable for the following: Afebrile, soft blood pressures in the low 100s to 120s; oxygen saturation 1% on room  air.  Labs were notable for the following: CMP notable for sodium 141, bicarbonate 19, anion gap 19, creatinine 1.33 compared to most recent prior value of 0.75 in September 2020, glucose 137.  CBC notable for white blood cell count 10,200.  Urinalysis showed no white blood cells, nitrate negative/leukocyte esterase negative, no evidence of hemoglobin.  Chest x-ray, per formal radiology read, showed no evidence of acute cardiopulmonary process, while CT head showed no evidence of acute intracranial abnormality, including no evidence of intracranial hemorrhage or any evidence of acute infarct.  While in the ED, the following were administered: Per neurology recommendation, Keppra load administered in the form of 1 g IV x 1.  He had also received Ativan 1 mg IV x 1.  Subsequently, the patient was admitted for observation for further evaluation management of presenting breakthrough seizures.    Review of Systems: As per HPI otherwise 10 point review of systems negative.   Past Medical History:  Diagnosis Date   Allergic rhinitis    Autism    CP (cerebral palsy) (HCC)    Mental retardation, idiopathic severe    Pneumonia 06/2015   Seizure (HCC) 08/24/2014    Past Surgical History:  Procedure Laterality Date   HEMATOMA EVACUATION Left 04/21/2019   Procedure: EVACUATION HEMATOMA left ear;  Surgeon: Christia Reading, MD;  Location: [facility];  Service: ENT;  Laterality: Left;   NO PAST SURGERIES      Social History:  reports that he has never smoked. He has never used smokeless tobacco. He reports that he does not drink  alcohol and does not use drugs.   No Known Allergies  Family History  Problem Relation Age of Onset   Arthritis Paternal Grandmother    Obesity Other    Stroke Other    Hypertension Other    Seizures Maternal Uncle     Family history reviewed and not pertinent    Prior to Admission medications   Medication Sig Start Date End Date Taking? Authorizing  Provider  cetirizine (ZYRTEC) 1 MG/ML syrup GIVE 10ML'S BY MOUTH AT BEDTIME AS NEEDED FOR ALLERGY SYMPTOM CONTROL Patient taking differently: Take 10 mg by mouth daily as needed. 05/17/16   Maree Erie, MD  diazepam (DIASAT) 20 MG GEL Place 20 mg rectally as needed. 08/12/18   Dohmeier, Porfirio Mylar, MD  ibuprofen (ADVIL,MOTRIN) 100 MG/5ML suspension Take 200 mg by mouth daily as needed for fever.  01/01/16   [provider]  levETIRAcetam (KEPPRA) 100 MG/ML solution Take 2.5 mLs (250 mg total) by mouth 2 (two) times daily. CALL [phone] TO SCHEDULE FOLLOW UP FOR FUTURE REFILLS Patient taking differently: Take 250 mg by mouth 2 (two) times daily. 05/22/20   Dohmeier, Porfirio Mylar, MD     Objective    Physical Exam: Vitals:   01/12/22 0015 01/12/22 0030 01/12/22 0045 01/12/22 0051  BP: 110/71 112/67 110/70   Pulse: 87 85 98   Resp: 12 11 17    Temp:    98.2 F (36.8 C)  TempSrc:    Oral  SpO2: 100% 100% 100%     General: appears to be stated age; somnolent Skin: warm, dry, no rash Head:  AT/[place] Mouth:  Oral mucosa membranes appear dry, normal dentition Neck: supple; trachea midline Heart:  RRR; did not appreciate any M/R/G Lungs: CTAB, did not appreciate any wheezes, rales, or rhonchi Abdomen: + BS; soft, ND, NT Vascular: 2+ pedal pulses b/l; 2+ radial pulses b/l Extremities: no peripheral edema, no muscle wasting Neuro:  In the setting of the patient's current mental status and associated inability to follow instructions, unable to perform full neurologic exam at this time. No overt e/o residual tonic-clonic activity. No tremors.     Labs on Admission: I have personally reviewed following labs and imaging studies  CBC: Recent Labs  Lab 01/11/22 2025 01/11/22 2036  WBC 10.2  --   HGB 12.7* 13.9  HCT 39.4 41.0  MCV 85.1  --   PLT 240  --    Basic Metabolic Panel: Recent Labs  Lab 01/11/22 2025 01/11/22 2036  NA 141 141  K 3.8 3.7  CL 103  --   CO2 19*  --    GLUCOSE 137*  --   BUN 12  --   CREATININE 1.33*  --   CALCIUM 8.8*  --    GFR: CrCl cannot be calculated (Unknown ideal weight.). Liver Function Tests: Recent Labs  Lab 01/11/22 2025  AST 95*  ALT 50*  ALKPHOS 44  BILITOT 0.2*  PROT 7.0  ALBUMIN 4.1   No results for input(s): "LIPASE", "AMYLASE" in the last 168 hours. No results for input(s): "AMMONIA" in the last 168 hours. Coagulation Profile: No results for input(s): "INR", "PROTIME" in the last 168 hours. Cardiac Enzymes: No results for input(s): "CKTOTAL", "CKMB", "CKMBINDEX", "TROPONINI" in the last 168 hours. BNP (last 3 results) No results for input(s): "PROBNP" in the last 8760 hours. HbA1C: No results for input(s): "HGBA1C" in the last 72 hours. CBG: Recent Labs  Lab 01/11/22 2012  GLUCAP 168*   Lipid Profile: No  results for input(s): "CHOL", "HDL", "LDLCALC", "TRIG", "CHOLHDL", "LDLDIRECT" in the last 72 hours. Thyroid Function Tests: No results for input(s): "TSH", "T4TOTAL", "FREET4", "T3FREE", "THYROIDAB" in the last 72 hours. Anemia Panel: No results for input(s): "VITAMINB12", "FOLATE", "FERRITIN", "TIBC", "IRON", "RETICCTPCT" in the last 72 hours. Urine analysis:    Component Value Date/Time   COLORURINE YELLOW 01/11/2022 2303   APPEARANCEUR HAZY (A) 01/11/2022 2303   LABSPEC 1.010 01/11/2022 2303   PHURINE 5.0 01/11/2022 2303   GLUCOSEU NEGATIVE 01/11/2022 2303   HGBUR NEGATIVE 01/11/2022 2303   BILIRUBINUR NEGATIVE 01/11/2022 2303   KETONESUR NEGATIVE 01/11/2022 2303   PROTEINUR NEGATIVE 01/11/2022 2303   UROBILINOGEN 0.2 10/19/2014 1027   NITRITE NEGATIVE 01/11/2022 2303   LEUKOCYTESUR NEGATIVE 01/11/2022 2303    Radiological Exams on Admission: CT Head Wo Contrast  Result Date: 01/12/2022 CLINICAL DATA:  Seizure, headache. EXAM: CT HEAD WITHOUT CONTRAST TECHNIQUE: Contiguous axial images were obtained from the base of the skull through the vertex without intravenous contrast.  RADIATION DOSE REDUCTION: This exam was performed according to the departmental dose-optimization program which includes automated exposure control, adjustment of the mA and/or kV according to patient size and/or use of iterative reconstruction technique. COMPARISON:  Head CT 07/08/2015.  MRI brain 08/24/2014. FINDINGS: Brain: No evidence of acute infarction, hemorrhage, hydrocephalus, extra-axial collection or mass lesion/mass effect. Cavum septum pellucidum and vergae again noted, unchanged. Vascular: No hyperdense vessel or unexpected calcification. Skull: Normal. Negative for fracture or focal lesion. Sinuses/Orbits: No acute finding. Other: None. IMPRESSION: No acute intracranial abnormality identified. Electronically Signed   By: Darliss CheneyAmy  Guttmann M.D.   On: 01/12/2022 00:12   DG Chest Port 1 View  Result Date: 01/11/2022 CLINICAL DATA:  status epilepticus with secretions ?aspiration EXAM: PORTABLE CHEST 1 VIEW COMPARISON:  Chest x-ray 05/11/2017 FINDINGS: The heart and mediastinal contours are within normal limits. No focal consolidation. No pulmonary edema. No pleural effusion. No pneumothorax. No acute osseous abnormality. IMPRESSION: No active disease. Electronically Signed   By: Tish FredericksonMorgane  Naveau M.D.   On: 01/11/2022 22:19      Assessment/Plan   Principal Problem:   Seizure (HCC) Active Problems:   Acute encephalopathy   AKI (acute kidney injury) (HCC)   High anion gap metabolic acidosis       #) Breakthrough seizures: History of seizures, patient presented with at least 2 separate incidents of generalized tonic-clonic breakthrough seizures over the course of the last day, with ensuing postictal state.  ED consulted in the ED, with initiation of EEG monitoring, which is less for showed no residual underlying seizure-like activity.  Potential contributing factors include recently missed doses of Keppra at home as well as potential optimal dosing, resulting in neurology recommendation to  escalate home dose to 500 mg twice daily.  Status post Keppra load administered in ED per recommendation neurology.  No overt evidence of underlying infectious contribution.  Index of suspicion for alcohol withdrawal versus recreational drug use is low given history provided by the patient's caregiver.  No overt contributory electrolyte abnormalities.  No recent trauma.  Additionally, CT head showed no evidence of acute intracranial process, as above.  Plan: Neurology formally consulted, as above.  Further recommendations, increase Keppra to 500 mg twice daily.  Repeat CMP, CBC in the morning.  Check serum magnesium level.  Seizure precautions.  EEG monitoring.  N.p.o. the setting of postictal state.          #) Acute encephalopathy: Postictal state.  No evidence of additional contributing factors  at this time clinically no evidence of underlying infection.  per initial EEG monitoring, no evidence to suggest ongoing subclinical seizures.  Plan: Further evaluation management of breakthrough seizures, including increased dose of Keppra and additional EEG monitoring, as above.  CMP, CBC in the morning.  Evidence for magnesium level.  Keep n.p.o. for now.  Additionally, check VBG.              # ) anion gap metabolic acidosis, as indicated on presenting CMP.  Suspect contribution from lactic acidosis as a consequence of seizures earlier today, with additional more minor contribution from acute kidney injury.   Plan: Further evaluation management breakthrough seizures, as above.  Repeat CMP in the morning.  Further evaluate via VBG.  Further evaluation management worsening AKI, this would describe below.  Check INR.             #) Acute Kidney Injury:  as quantified above.  Suspect contribution from breakthrough seizures earlier today.  While rhabdomyolysis is a possibility given this history, presenting urinalysis appears inconsistent with myoglobinuria.  Will closely trend  end-stage renal function, with possibility of further assessing his CPK level of no improvement in interval renal function.  Plan: monitor strict I's & O's and daily weights. Attempt to avoid nephrotoxic agents. Refrain from NSAIDs. Repeat CMP in the morning. Check serum magnesium level. Add-on random urine sodium and random urine creatinine.       DVT prophylaxis: Lovenox 40 mg subcu daily Code Status: Full code Family Communication: I discussed the patient's case with his caregiver, who is present at bedside Disposition Plan: Per Rounding Team Consults called: Dr. Wilford Corner of neurology has been formally consulted, as above.;  Admission status: Observation     I SPENT GREATER THAN 75  MINUTES IN CLINICAL CARE TIME/MEDICAL DECISION-MAKING IN COMPLETING THIS ADMISSION.      Chaney Born Emmaline Wahba DO [facility]  From 7PM - 7AM   01/12/2022, 1:15 AM

## 2022-01-12 NOTE — ED Notes (Signed)
Patient resting in bed, EEG in progress, no s/s of distress, family present at bedside, will continue to monitor.

## 2022-01-12 NOTE — Progress Notes (Signed)
EEG maint complete.  ?

## 2022-01-12 NOTE — Progress Notes (Signed)
LTM EEG:  Electrographic status epilepticus, generalized; Spike,generalized; continuous slow, generalized. This study initially showed evidence of epileptogenicity with generalized onset. The spikes at time appear periodic at [facility] and concerning for ictal-interictal continuum. After around 10 am on 11/12/2021, EEG worsened and was consistent with electrographic status epilepticus with generalized onset. Additionally, there is moderate diffuse encephalopathy, nonspecific etiology.  Plan: - Keppra 2000 mg IV x 1 STAT - Increasing scheduled Keppra dose to 1000 mg IV BID - Ativan 2 mg IV x 1 STAT - Has normal eGFR  Electronically signed: Dr. Kerney Elbe

## 2022-01-12 NOTE — ED Notes (Signed)
Paged the hospitalist to RN [facility]

## 2022-01-12 NOTE — ED Notes (Signed)
Continuous  EEG still in progress.

## 2022-01-12 NOTE — Procedures (Addendum)
Patient Name: Jonathan [name]  MRN: [id number]  Epilepsy Attending: Charlsie Quest  Referring Physician/Provider: Milon Dikes, MD  Duration: 01/11/2022 2245 to 01/12/2022 2245  Patient history: 26 year old with autism, severe cerebral palsy, developmental delay, history of seizures presenting with flurry of seizures that happened this evening. Does not appear to be back to baseline. EEG to evaluate for status epilepticus.   Level of alertness:  lethargic   AEDs during EEG study: LEV  Technical aspects: This EEG study was done with scalp electrodes positioned according to the 10-20 International system of electrode placement. Electrical activity was reviewed with band pass filter of 1-70Hz , sensitivity of 7 uV/mm, display speed of 47mm/sec with a 60Hz  notched filter applied as appropriate. EEG data were recorded continuously and digitally stored.  Video monitoring was available and reviewed as appropriate.  Description: At the beginning of study, no clear posterior dominant rhythm was seen. EEG showed continuous generalized 6-9 Hz theta-alpha activity admixed with 13-15hz  generalized beta activity. Abundant generalized and maximal bifrontal spikes were noted which at times appear periodic at 1hz . Seizure without clinical signs was noted starting on 01/12/2022 at 0948 lasting for 10 seconds. Between 10-1028. frequent seizures were noted lasting 10-30 seconds lasting for more than 20% of the recording consistent with non-convulsive  generalized status epilepticus. Concomitant EEG showed generalized sharply contoured 6-7hz  theta slowing which appeared higher in amplitude in right frontal region. EEG then showed evolution in morphology and evolved to 2-3hz  delta slowing admixed with generalized spikes. After 1030 EEG showed near continuous generalized and maximal bifrontal polyspikes.   IV ativan was administered on 01/12/2022 at 1534. Subsequently status epilepticus resolved and EEG showed posterior  dominant rhythm of 10-11 Hz activity of moderate voltage (25-35 uV) seen predominantly in posterior head regions, symmetric and reactive to eye opening and eye closing. Sleep was characterized by vertex waves, sleep spindles (12 to 14 Hz), maximal frontocentral region. Intermittent generalized 3-6hz  theta-delta slowing was also noted.  Hyperventilation and photic stimulation were not performed.     ABNORMALITY - Electrographic status epilepticus, generalized  - Spike, generalized - Continuous slow, generalized  IMPRESSION: This study initially showed evidence of epileptogenicity with generalized onset. The spikes at time appear periodic at 1hz  and concerning for ictal-interictal continuum. Between 10 to 1030 am on 11/12/2021, EEG worsened and was consistent with electrographic status epilepticus with generalized onset. Additionally, there is moderate diffuse encephalopathy, nonspecific etiology. IV ativan was administered on 01/12/2022 at 1534. Subsequently status epilepticus resolved and EEG was suggestive of mild diffuse encephalopathy.   Dr. was notified.  Jonathan [name] 11/14/2021

## 2022-01-13 DIAGNOSIS — D649 Anemia, unspecified: Secondary | ICD-10-CM

## 2022-01-13 LAB — PHOSPHORUS: Phosphorus: 2.4 mg/dL — ABNORMAL LOW (ref 2.5–4.6)

## 2022-01-13 LAB — CBC WITH DIFFERENTIAL/PLATELET
Abs Immature Granulocytes: 0.01 10*3/uL (ref 0.00–0.07)
Basophils Absolute: 0.1 10*3/uL (ref 0.0–0.1)
Basophils Relative: 2 %
Eosinophils Absolute: 0 10*3/uL (ref 0.0–0.5)
Eosinophils Relative: 1 %
HCT: 36.9 % — ABNORMAL LOW (ref 39.0–52.0)
Hemoglobin: 12.7 g/dL — ABNORMAL LOW (ref 13.0–17.0)
Immature Granulocytes: 0 %
Lymphocytes Relative: 38 %
Lymphs Abs: 1.3 10*3/uL (ref 0.7–4.0)
MCH: 28.2 pg (ref 26.0–34.0)
MCHC: 34.4 g/dL (ref 30.0–36.0)
MCV: 81.8 fL (ref 80.0–100.0)
Monocytes Absolute: 0.3 10*3/uL (ref 0.1–1.0)
Monocytes Relative: 8 %
Neutro Abs: 1.8 10*3/uL (ref 1.7–7.7)
Neutrophils Relative %: 51 %
Platelets: 192 10*3/uL (ref 150–400)
RBC: 4.51 MIL/uL (ref 4.22–5.81)
RDW: 14.6 % (ref 11.5–15.5)
WBC: 3.4 10*3/uL — ABNORMAL LOW (ref 4.0–10.5)
nRBC: 0 % (ref 0.0–0.2)

## 2022-01-13 LAB — COMPREHENSIVE METABOLIC PANEL
ALT: 33 U/L (ref 0–44)
AST: 49 U/L — ABNORMAL HIGH (ref 15–41)
Albumin: 3.8 g/dL (ref 3.5–5.0)
Alkaline Phosphatase: 45 U/L (ref 38–126)
Anion gap: 9 (ref 5–15)
BUN: 7 mg/dL (ref 6–20)
CO2: 20 mmol/L — ABNORMAL LOW (ref 22–32)
Calcium: 8.7 mg/dL — ABNORMAL LOW (ref 8.9–10.3)
Chloride: 109 mmol/L (ref 98–111)
Creatinine, Ser: 0.93 mg/dL (ref 0.61–1.24)
GFR, Estimated: >60 mL/min (ref >60)
Glucose, Bld: 129 mg/dL — ABNORMAL HIGH (ref 70–99)
Potassium: 3.9 mmol/L (ref 3.5–5.1)
Sodium: 138 mmol/L (ref 135–145)
Total Bilirubin: 0.8 mg/dL (ref 0.3–1.2)
Total Protein: 6.9 g/dL (ref 6.5–8.1)

## 2022-01-13 LAB — GLUCOSE, CAPILLARY
Glucose-Capillary: 108 mg/dL — ABNORMAL HIGH (ref 70–99)
Glucose-Capillary: 156 mg/dL — ABNORMAL HIGH (ref 70–99)
Glucose-Capillary: 140 mg/dL — ABNORMAL HIGH (ref 70–99)

## 2022-01-13 LAB — PROTIME-INR
INR: 1.2 (ref 0.8–1.2)
Prothrombin Time: 15.4 seconds — ABNORMAL HIGH (ref 11.4–15.2)

## 2022-01-13 LAB — MAGNESIUM: Magnesium: 1.9 mg/dL (ref 1.7–2.4)

## 2022-01-13 MED ORDER — LEVETIRACETAM 100 MG/ML PO SOLN: 500.0000 mg | Freq: Two times a day (BID) | ORAL | 0 refills | Status: DC

## 2022-01-13 MED ORDER — DEXTROSE 50 % IV SOLN
1.0000 | Freq: Once | INTRAVENOUS | Status: AC
  Administered 2022-01-13: 50 mL via INTRAVENOUS
  Filled 2022-01-13: qty 50

## 2022-01-13 NOTE — Progress Notes (Signed)
   01/13/22 3546  Provider Notification  Provider Name/Title Dr. Marland Mcalpine Attending  Date Provider Notified 01/13/22  Time Provider Notified [phone]  Method of Notification Page  Notification Reason Critical Result  Test performed and critical result CBG 34  Date Critical Result Received 01/13/22  Time Critical Result Received 0750  Provider response Evaluate remotely;See new orders  Date of Provider Response 01/13/22  Time of Provider Response 0757   Patient's CBG 34, paged Dr. Marland Mcalpine for orders as patient not diabetic, cannot use standing orders. Amp of D50 order received. Dose given, see MAR.  Awaiting 15 minutes to pass to recheck CBG.

## 2022-01-13 NOTE — Discharge Summary (Signed)
Physician Discharge Summary   Patient: Jonathan [name] MRN: [id number] DOB: 1995/03/23  Admit date:     01/11/2022  Discharge date: 01/13/22  Discharge Physician: Raiford Noble, DO   PCP: Suella Broad, FNP   Recommendations at discharge:    Follow up with PCP within 1-2 weeks and repeat CBC, CMP, Mag, Phos Follow up with Neurology within 1-2 weeks   Discharge Diagnoses: Principal Problem:   Seizure ([place]) Active Problems:   Acute encephalopathy   AKI (acute kidney injury) ([place])   High anion gap metabolic acidosis  Resolved Problems:   * No resolved hospital problems. [facility] Course: Patient is a 26 year old chronically ill-appearing African-American male with a history significant for cerebral palsy as well as developmental delay and seizures who was brought to [facility] after a breakthrough seizure after missing one of his medications.  He received 250 mg of Keppra twice daily and patient's caregiver mentioned that he missed a dose a few days ago.  Neurology was consulted and he was loaded with Keppra and he underwent continuous EEG.  He is admitted for breakthrough seizures and neurology was consulted.  The initial EEG showed evidence of epileptogenicity with generalized onset and showed status epilepticus.  This was abated with lorazepam and he is improved.  He underwent LTM monitoring and neurology felt that he is safe to be discharged on increased Keppra dose.  He is medically stable for discharge at this time as he is at his baseline.  Assessment and Plan:  Breakthrough seizures: History of seizures, patient presented with at least 2 separate incidents of generalized tonic-clonic breakthrough seizures over the course of the last day, with ensuing postictal state.  ED consulted in the ED, with initiation of EEG monitoring, which is less for showed no residual underlying seizure-like activity.  Potential contributing factors include recently missed doses of Keppra at home as  well as potential optimal dosing, resulting in neurology recommendation to escalate home dose to 500 mg twice daily.  Status post Keppra load administered in ED per recommendation neurology.  No overt evidence of underlying infectious contribution.  Index of suspicion for alcohol withdrawal versus recreational drug use is low given history provided by the patient's caregiver.  No overt contributory electrolyte abnormalities.  No recent trauma.  Additionally, CT head showed no evidence of acute intracranial process, as above. -Neurology formally consulted, as above.  Further recommendations, increase Keppra to 500 mg twice daily and will continue as above.   -EEG monitoring done and showed intermittent slow generalized and was suggestive of mild diffuse encephalopathy with nonspecific etiology with no seizures seen -Patient's father thinks he is back at baseline and he can follow-up with PCP as well as outpatient neurologist in outpatient setting  Acute encephalopathy: Postictal state.   -No evidence of additional contributing factors at this time clinically no evidence of underlying infection.  per initial EEG monitoring, no evidence to suggest ongoing subclinical seizures. -Further evaluation management of breakthrough seizures, including increased dose of Keppra and additional EEG monitoring, as above.  CMP, CBC in the morning.     Anion gap metabolic acidosis, as indicated on presenting CMP and improved.  Suspect contribution from lactic acidosis as a consequence of seizures earlier today, with additional more minor contribution from acute kidney injury.  -Further evaluation management breakthrough seizures, as above.  Repeat CMP in the morning.  Further evaluate via VBG.  Further evaluation management worsening AKI, this would describe below.  Check INR. -CO2 at the time  of discharge was 25, anion gap was 8, chloride level is 107    Acute Kidney Injury - Suspect contribution from breakthrough  seizures earlier today.  While rhabdomyolysis is a possibility given this history, presenting urinalysis appears inconsistent with myoglobinuria.  Will closely trend end-stage renal function, with possibility of further assessing his CPK level of no improvement in interval renal function. -Improved with patient BUN/creatinine went from 12/1.33 and is now 9/1.16 -Avoid further nephrotoxic medications, contrast dyes, hypotension and dehydration to ensure adequate renal perfusion  Abnormal LFTs -Mild and trending down Patient's AST went from 95 is now 63 -ALT is 50 and is now 45 -Continue monitor and trend and repeat CMP in outpatient setting  Normocytic anemia -Patient's hemoglobin/hematocrit went from 13.9/41.0 and is now 12.3/35.5 -Check anemia panel in outpatient setting continue to monitor for signs and symptoms of bleeding; no overt bleeding noted  Consultants: Neurology Procedures performed: EEG Disposition: Home Diet recommendation:  Regular diet DISCHARGE MEDICATION: Allergies as of 01/13/2022   No Known Allergies      Medication List     TAKE these medications    cetirizine 1 MG/ML syrup Commonly known as: ZYRTEC GIVE 10ML'S BY MOUTH AT BEDTIME AS NEEDED FOR ALLERGY SYMPTOM CONTROL What changed: See the new instructions.   diazepam 20 MG Gel Commonly known as: DIASAT Place 20 mg rectally as needed.   levETIRAcetam 100 MG/ML solution Commonly known as: KEPPRA Take 5 mLs (500 mg total) by mouth 2 (two) times daily. CALL [phone] TO SCHEDULE FOLLOW UP FOR FUTURE REFILLS What changed: how much to take        Discharge Exam: There were no vitals filed for this visit. Vitals:   01/13/22 1256 01/13/22 1610  BP: (!) 98/54 101/72  Pulse: 86 98  Resp: 16 15  Temp: 98.5 F (36.9 C) 98.3 F (36.8 C)  SpO2: 98% 100%   Examination: Physical Exam:  Constitutional: Chronically ill-appearing thin African-American male with cerebral palsy who has contractures  and is at his baseline Respiratory: Diminished to auscultation bilaterally, no wheezing, rales, rhonchi or crackles. Normal respiratory effort and patient is not tachypenic. No accessory muscle use.  Cardiovascular: RRR, no murmurs / rubs / gallops. S1 and S2 auscultated. No extremity edema.  Abdomen: Soft, non-tender, non-distended.  Bowel sounds positive.  GU: Deferred. Musculoskeletal: Contractures noted Skin: No rashes, lesions, ulcers on limited skin evaluation. No induration; Warm and dry.  Neurologic: Unable to all commands due to cerebral palsy Psychiatric: Impaired judgment and insight   Condition at discharge: stable  The results of significant diagnostics from this hospitalization (including imaging, microbiology, ancillary and laboratory) are listed below for reference.   Imaging Studies: Overnight EEG with video  Result Date: 01/12/2022 Lora Havens, MD     01/13/2022 11:14 AM Patient Name: Jonathan [name] MRN: [id number] Epilepsy Attending: Lora Havens Referring Physician/Provider: Amie Portland, MD Duration: 01/11/2022 2245 to 01/12/2022 2245 Patient history: 26 year old with autism, severe cerebral palsy, developmental delay, history of seizures presenting with flurry of seizures that happened this evening. Does not appear to be back to baseline. EEG to evaluate for status epilepticus. Level of alertness:  lethargic AEDs during EEG study: LEV Technical aspects: This EEG study was done with scalp electrodes positioned according to the 10-20 International system of electrode placement. Electrical activity was reviewed with band pass filter of 1-70Hz , sensitivity of 7 uV/mm, display speed of 49mm/sec with a 60Hz  notched filter applied as appropriate. EEG data were recorded continuously and digitally  stored.  Video monitoring was available and reviewed as appropriate. Description: At the beginning of study, no clear posterior dominant rhythm was seen. EEG showed continuous  generalized 6-9 Hz theta-alpha activity admixed with 13-15hz  generalized beta activity. Abundant generalized and maximal bifrontal spikes were noted which at times appear periodic at 1hz . Seizure without clinical signs was noted starting on 01/12/2022 at 0948 lasting for 10 seconds. Between 10-1028. frequent seizures were noted lasting 10-30 seconds lasting for more than 20% of the recording consistent with non-convulsive  generalized status epilepticus. Concomitant EEG showed generalized sharply contoured 6-7hz  theta slowing which appeared higher in amplitude in right frontal region. EEG then showed evolution in morphology and evolved to 2-3hz  delta slowing admixed with generalized spikes. After 1030 EEG showed near continuous generalized and maximal bifrontal polyspikes. IV ativan was administered on 01/12/2022 at 1534. Subsequently status epilepticus resolved and EEG showed posterior dominant rhythm of 10-11 Hz activity of moderate voltage (25-35 uV) seen predominantly in posterior head regions, symmetric and reactive to eye opening and eye closing. Sleep was characterized by vertex waves, sleep spindles (12 to 14 Hz), maximal frontocentral region. Intermittent generalized 3-6hz  theta-delta slowing was also noted. Hyperventilation and photic stimulation were not performed.   ABNORMALITY - Electrographic status epilepticus, generalized - Spike, generalized - Continuous slow, generalized IMPRESSION: This study initially showed evidence of epileptogenicity with generalized onset. The spikes at time appear periodic at 1hz  and concerning for ictal-interictal continuum. Between 10 to 1030 am on 11/12/2021, EEG worsened and was consistent with electrographic status epilepticus with generalized onset. Additionally, there is moderate diffuse encephalopathy, nonspecific etiology. IV ativan was administered on 01/12/2022 at 1534. Subsequently status epilepticus resolved and EEG was suggestive of mild diffuse encephalopathy.  Dr. 11/14/2021 was notified. 01/14/2022   CT Head Wo Contrast  Result Date: 01/12/2022 CLINICAL DATA:  Seizure, headache. EXAM: CT HEAD WITHOUT CONTRAST TECHNIQUE: Contiguous axial images were obtained from the base of the skull through the vertex without intravenous contrast. RADIATION DOSE REDUCTION: This exam was performed according to the departmental dose-optimization program which includes automated exposure control, adjustment of the mA and/or kV according to patient size and/or use of iterative reconstruction technique. COMPARISON:  Head CT 07/08/2015.  MRI brain 08/24/2014. FINDINGS: Brain: No evidence of acute infarction, hemorrhage, hydrocephalus, extra-axial collection or mass lesion/mass effect. Cavum septum pellucidum and vergae again noted, unchanged. Vascular: No hyperdense vessel or unexpected calcification. Skull: Normal. Negative for fracture or focal lesion. Sinuses/Orbits: No acute finding. Other: None. IMPRESSION: No acute intracranial abnormality identified. Electronically Signed   By: 09/07/2015 M.D.   On: 01/12/2022 00:12   DG Chest Port 1 View  Result Date: 01/11/2022 CLINICAL DATA:  status epilepticus with secretions ?aspiration EXAM: PORTABLE CHEST 1 VIEW COMPARISON:  Chest x-ray 05/11/2017 FINDINGS: The heart and mediastinal contours are within normal limits. No focal consolidation. No pulmonary edema. No pleural effusion. No pneumothorax. No acute osseous abnormality. IMPRESSION: No active disease. Electronically Signed   By: 01/13/2022 M.D.   On: 01/11/2022 22:19    Microbiology: Results for orders placed or performed during the hospital encounter of 01/11/22  Blood culture (routine x 2)     Status: None (Preliminary result)   Collection Time: 01/11/22  8:08 PM   Specimen: BLOOD RIGHT HAND  Result Value Ref Range Status   Specimen Description BLOOD RIGHT HAND  Final   Special Requests   Final    BOTTLES DRAWN AEROBIC ONLY Blood Culture results may not be  optimal due to an inadequate volume of blood received in culture bottles   Culture   Final    NO GROWTH 2 DAYS Performed at [facility], [place] [address], [place], [place] [address]    Report Status PENDING  Incomplete  Blood culture (routine x 2)     Status: None (Preliminary result)   Collection Time: 01/11/22  8:13 PM   Specimen: BLOOD LEFT HAND  Result Value Ref Range Status   Specimen Description BLOOD LEFT HAND  Final   Special Requests   Final    BOTTLES DRAWN AEROBIC AND ANAEROBIC Blood Culture adequate volume   Culture   Final    NO GROWTH 2 DAYS Performed at [facility], [place] [address], [place], [place] [address]    Report Status PENDING  Incomplete  C Difficile Quick Screen w PCR reflex     Status: None   Collection Time: 01/12/22  8:35 AM   Specimen: STOOL  Result Value Ref Range Status   C Diff antigen NEGATIVE NEGATIVE Final   C Diff toxin NEGATIVE NEGATIVE Final   C Diff interpretation No C. difficile detected.  Final    Comment: Performed at [facility], [place] [address], [place],  [address]    Labs: CBC: Recent Labs  Lab 01/11/22 2025 01/11/22 2036 01/12/22 0415 01/13/22 0953  WBC 10.2  --  8.1 3.4*  NEUTROABS  --   --  6.3 1.8  HGB 12.7* 13.9 12.3* 12.7*  HCT 39.4 41.0 35.5* 36.9*  MCV 85.1  --  81.8 81.8  PLT 240  --  208 [id number]   Basic Metabolic Panel: Recent Labs  Lab 01/11/22 2025 01/11/22 2036 01/12/22 0415 01/13/22 0953  NA 141 141 140 138  K 3.8 3.7 3.5 3.9  CL 103  --  107 109  CO2 19*  --  25 20*  GLUCOSE 137*  --  70 129*  BUN 12  --  9 7  CREATININE 1.33*  --  1.16 0.93  CALCIUM 8.8*  --  8.6* 8.7*  MG  --   --  2.0 1.9  PHOS  --   --   --  2.4*   Liver Function Tests: Recent Labs  Lab 01/11/22 2025 01/12/22 0415 01/13/22 0953  AST 95* 63* 49*  ALT 50* 45* 33  ALKPHOS 44 40 45  BILITOT 0.2* 0.5 0.8  PROT 7.0 6.5 6.9  ALBUMIN 4.1 3.8 3.8   CBG: Recent Labs  Lab 01/11/22 2012 01/13/22 0840  01/13/22 1241 01/13/22 1609  GLUCAP 168* 108* 156* 140*    Discharge time spent: greater than 30 minutes.  Signed: Raiford Noble, DO [facility] 01/13/2022

## 2022-01-13 NOTE — Progress Notes (Signed)
Nursing Discharge Note   Admit Date: 01/11/2022  Discharge date: 01/13/2022   Jonathan [name] is to be discharged home per MD order.  AVS completed. Reviewed with patient and legal guardian at bedside. Highlighted copy provided for patient to take home.  Patient/caregiver able to verbalize understanding of discharge instructions. PIV removed. Patient stable upon discharge.   Discharge Instructions     Call MD for:  difficulty breathing, headache or visual disturbances   Complete by: As directed    Call MD for:  extreme fatigue   Complete by: As directed    Call MD for:  hives   Complete by: As directed    Call MD for:  persistant dizziness or light-headedness   Complete by: As directed    Call MD for:  persistant nausea and vomiting   Complete by: As directed    Call MD for:  redness, tenderness, or signs of infection (pain, swelling, redness, odor or green/yellow discharge around incision site)   Complete by: As directed    Call MD for:  severe uncontrolled pain   Complete by: As directed    Call MD for:  temperature >100.4   Complete by: As directed    Diet general   Complete by: As directed    Discharge instructions   Complete by: As directed    You were cared for by a hospitalist during your hospital stay. If you have any questions about your discharge medications or the care you received while you were in the hospital after you are discharged, you can call the unit and ask to speak with the hospitalist on call if the hospitalist that took care of you is not available. Once you are discharged, your primary care physician will handle any further medical issues. Please note that NO REFILLS for any discharge medications will be authorized once you are discharged, as it is imperative that you return to your primary care physician (or establish a relationship with a primary care physician if you do not have one) for your aftercare needs so that they can reassess your need for medications  and monitor your lab values.  Follow up with PCP and Neurology within 1 week. Take all medications as prescribed and Neurology recommends increasing Keppra to 500 mg po BID . If symptoms change or worsen please return to the ED for evaluation   Increase activity slowly   Complete by: As directed        Allergies as of 01/13/2022   No Known Allergies      Medication List     TAKE these medications    cetirizine 1 MG/ML syrup Commonly known as: ZYRTEC GIVE 10ML'S BY MOUTH AT BEDTIME AS NEEDED FOR ALLERGY SYMPTOM CONTROL What changed: See the new instructions.   diazepam 20 MG Gel Commonly known as: DIASAT Place 20 mg rectally as needed.   levETIRAcetam 100 MG/ML solution Commonly known as: KEPPRA Take 5 mLs (500 mg total) by mouth 2 (two) times daily. CALL [phone] TO SCHEDULE FOLLOW UP FOR FUTURE REFILLS What changed: how much to take         Discharge Instructions/ Education: Discharge instructions given to patient/family with verbalized understanding. Discharge education completed with patient/family including: follow up instructions, medication list, discharge activities, and limitations if indicated.  Patient and family able to verbalize understanding, all questions fully answered. Patient instructed to return to Emergency Department, call 911, or call MD for any changes in condition.  Patient escorted via wheelchair to lobby and  discharged home via private automobile.

## 2022-01-13 NOTE — Progress Notes (Addendum)
Subjective: Caretaker at bedside.   Objective: Current vital signs: BP (!) 98/54 (BP Location: Right Arm)   Pulse 86   Temp 98.5 F (36.9 C) (Oral)   Resp 16   SpO2 98%  Vital signs in last 24 hours: Temp:  [97.8 F (36.6 C)-98.9 F (37.2 C)] 98.5 F (36.9 C) (12/17 1256) Pulse Rate:  [63-103] 86 (12/17 1256) Resp:  [11-23] 16 (12/17 1256) BP: (66-118)/(43-74) 98/54 (12/17 1256) SpO2:  [92 %-100 %] 98 % (12/17 1256)  Intake/Output from previous day: 12/16 0701 - 12/17 0700 In: 0  Out: 600 [Urine:600] Intake/Output this shift: Total I/O In: 240 [P.O.:240] Out: 2100 [Urine:2100] Nutritional status:  Diet Order             Diet regular Fluid consistency: Thin  Diet effective now                  HEENT: EEG leads in place Lungs: Respirations unlabored Ext: No edema  Neurologic Exam: Mental Status: Awake and alert today. Eyes open, is nonverbal at baseline and does not follow commands. Blinks to threat bilaterally, no facial asymmetry. Purses lips and makes stereotyped pouting/sputtering/razzing/clicking sounds which are now more varied and sound clearer/more distinct than yesterday - caretaker states these sounds match the vocal effects that the patient makes to entertain himself at baseline.   Motor: No change Sensation- Responds to touch x 4 Coordination: Unable to assess  Gait- Unable to assess  Lab Results: Results for orders placed or performed during the hospital encounter of 01/11/22 (from the past 48 hour(s))  Blood culture (routine x 2)     Status: None (Preliminary result)   Collection Time: 01/11/22  8:08 PM   Specimen: BLOOD RIGHT HAND  Result Value Ref Range   Specimen Description BLOOD RIGHT HAND    Special Requests      BOTTLES DRAWN AEROBIC ONLY Blood Culture results may not be optimal due to an inadequate volume of blood received in culture bottles   Culture      NO GROWTH 2 DAYS Performed at [facility], [place] [address]  [place], [place] [address]    Report Status PENDING   CBG monitoring, ED     Status: Abnormal   Collection Time: 01/11/22  8:12 PM  Result Value Ref Range   Glucose-Capillary 168 (H) 70 - 99 mg/dL    Comment: Glucose reference range applies only to samples taken after fasting for at least 8 hours.  Blood culture (routine x 2)     Status: None (Preliminary result)   Collection Time: 01/11/22  8:13 PM   Specimen: BLOOD LEFT HAND  Result Value Ref Range   Specimen Description BLOOD LEFT HAND    Special Requests      BOTTLES DRAWN AEROBIC AND ANAEROBIC Blood Culture adequate volume   Culture      NO GROWTH 2 DAYS Performed at [facility], [place] [address] [place], [place] [address]    Report Status PENDING   CBC     Status: Abnormal   Collection Time: 01/11/22  8:25 PM  Result Value Ref Range   WBC 10.2 4.0 - 10.5 K/uL   RBC 4.63 4.22 - 5.81 MIL/uL   Hemoglobin 12.7 (L) 13.0 - 17.0 g/dL   HCT 39.4 39.0 - 52.0 %   MCV 85.1 80.0 - 100.0 fL   MCH 27.4 26.0 - 34.0 pg   MCHC 32.2 30.0 - 36.0 g/dL   RDW 14.3 11.5 -  15.5 %   Platelets 240 150 - 400 K/uL   nRBC 0.0 0.0 - 0.2 %    Comment: Performed at [facility], [place] [address] [place], [place] [address]  Comprehensive metabolic panel     Status: Abnormal   Collection Time: 01/11/22  8:25 PM  Result Value Ref Range   Sodium 141 135 - 145 mmol/L   Potassium 3.8 3.5 - 5.1 mmol/L   Chloride 103 98 - 111 mmol/L   CO2 19 (L) 22 - 32 mmol/L   Glucose, Bld 137 (H) 70 - 99 mg/dL    Comment: Glucose reference range applies only to samples taken after fasting for at least 8 hours.   BUN 12 6 - 20 mg/dL   Creatinine, Ser 1.33 (H) 0.61 - 1.24 mg/dL   Calcium 8.8 (L) 8.9 - 10.3 mg/dL   Total Protein 7.0 6.5 - 8.1 g/dL   Albumin 4.1 3.5 - 5.0 g/dL   AST 95 (H) 15 - 41 U/L   ALT 50 (H) 0 - 44 U/L   Alkaline Phosphatase 44 38 - 126 U/L   Total Bilirubin 0.2 (L) 0.3 - 1.2 mg/dL   GFR, Estimated >60 >60 mL/min    Comment:  (NOTE) Calculated using the CKD-EPI Creatinine Equation (2021)    Anion gap 19 (H) 5 - 15    Comment: Performed at [facility], [place] [address] [place], [place] [address]  I-Stat venous blood gas, ED     Status: Abnormal   Collection Time: 01/11/22  8:36 PM  Result Value Ref Range   pH, Ven 7.265 7.25 - 7.43   pCO2, Ven 43.0 (L) 44 - 60 mmHg   pO2, Ven 46 (H) 32 - 45 mmHg   Bicarbonate 19.5 (L) 20.0 - 28.0 mmol/L   TCO2 21 (L) 22 - 32 mmol/L   O2 Saturation 75 %   Acid-base deficit 7.0 (H) 0.0 - 2.0 mmol/L   Sodium 141 135 - 145 mmol/L   Potassium 3.7 3.5 - 5.1 mmol/L   Calcium, Ion 1.13 (L) 1.15 - 1.40 mmol/L   HCT 41.0 39.0 - 52.0 %   Hemoglobin 13.9 13.0 - 17.0 g/dL   Sample type VENOUS   Urinalysis, Routine w reflex microscopic Urine, In & Out Cath     Status: Abnormal   Collection Time: 01/11/22 11:03 PM  Result Value Ref Range   Color, Urine YELLOW YELLOW   APPearance HAZY (A) CLEAR   Specific Gravity, Urine 1.010 1.005 - 1.030   pH 5.0 5.0 - 8.0   Glucose, UA NEGATIVE NEGATIVE mg/dL   Hgb urine dipstick NEGATIVE NEGATIVE   Bilirubin Urine NEGATIVE NEGATIVE   Ketones, ur NEGATIVE NEGATIVE mg/dL   Protein, ur NEGATIVE NEGATIVE mg/dL   Nitrite NEGATIVE NEGATIVE   Leukocytes,Ua NEGATIVE NEGATIVE    Comment: Performed at [facility], [address], [place], [place] [address]  CBC with Differential/Platelet     Status: Abnormal   Collection Time: 01/12/22  4:15 AM  Result Value Ref Range   WBC 8.1 4.0 - 10.5 K/uL   RBC 4.34 4.22 - 5.81 MIL/uL   Hemoglobin 12.3 (L) 13.0 - 17.0 g/dL   HCT 35.5 (L) 39.0 - 52.0 %   MCV 81.8 80.0 - 100.0 fL   MCH 28.3 26.0 - 34.0 pg   MCHC 34.6 30.0 - 36.0 g/dL   RDW 14.4 11.5 - 15.5 %   Platelets 208 150 - 400 K/uL  nRBC 0.0 0.0 - 0.2 %   Neutrophils Relative % 79 %   Neutro Abs 6.3 1.7 - 7.7 K/uL   Lymphocytes Relative 16 %   Lymphs Abs 1.3 0.7 - 4.0 K/uL   Monocytes Relative 5 %   Monocytes Absolute 0.4 0.1 - 1.0  K/uL   Eosinophils Relative 0 %   Eosinophils Absolute 0.0 0.0 - 0.5 K/uL   Basophils Relative 0 %   Basophils Absolute 0.0 0.0 - 0.1 K/uL   Immature Granulocytes 0 %   Abs Immature Granulocytes 0.02 0.00 - 0.07 K/uL    Comment: Performed at [facility], [address] [place], [place] [address]  Comprehensive metabolic panel     Status: Abnormal   Collection Time: 01/12/22  4:15 AM  Result Value Ref Range   Sodium 140 135 - 145 mmol/L   Potassium 3.5 3.5 - 5.1 mmol/L   Chloride 107 98 - 111 mmol/L   CO2 25 22 - 32 mmol/L   Glucose, Bld 70 70 - 99 mg/dL    Comment: Glucose reference range applies only to samples taken after fasting for at least 8 hours.   BUN 9 6 - 20 mg/dL   Creatinine, Ser 8.291.16 0.61 - 1.24 mg/dL   Calcium 8.6 (L) 8.9 - 10.3 mg/dL   Total Protein 6.5 6.5 - 8.1 g/dL   Albumin 3.8 3.5 - 5.0 g/dL   AST 63 (H) 15 - 41 U/L   ALT 45 (H) 0 - 44 U/L   Alkaline Phosphatase 40 38 - 126 U/L   Total Bilirubin 0.5 0.3 - 1.2 mg/dL   GFR, Estimated >56>60 >21>60 mL/min    Comment: (NOTE) Calculated using the CKD-EPI Creatinine Equation (2021)    Anion gap 8 5 - 15    Comment: Performed at [facility], [address] [place], [place] [address]  Magnesium     Status: None   Collection Time: 01/12/22  4:15 AM  Result Value Ref Range   Magnesium 2.0 1.7 - 2.4 mg/dL    Comment: Performed at [facility], [address] [place], [place] [address]  C Difficile Quick Screen w PCR reflex     Status: None   Collection Time: 01/12/22  8:35 AM   Specimen: STOOL  Result Value Ref Range   C Diff antigen NEGATIVE NEGATIVE   C Diff toxin NEGATIVE NEGATIVE   C Diff interpretation No C. difficile detected.     Comment: Performed at [facility], [address] [place], [place] [address]  Protime-INR     Status: Abnormal   Collection Time: 01/13/22  4:07 AM  Result Value Ref Range   Prothrombin Time 15.4 (H) 11.4 - 15.2 seconds   INR 1.2 0.8 - 1.2    Comment:  (NOTE) INR goal varies based on device and disease states. Performed at [facility], [address] [place], [place] [address]   Glucose, capillary     Status: Abnormal   Collection Time: 01/13/22  8:40 AM  Result Value Ref Range   Glucose-Capillary 108 (H) 70 - 99 mg/dL    Comment: Glucose reference range applies only to samples taken after fasting for at least 8 hours.  CBC with Differential/Platelet     Status: Abnormal   Collection Time: 01/13/22  9:53 AM  Result Value Ref Range   WBC 3.4 (L) 4.0 - 10.5 K/uL   RBC 4.51 4.22 - 5.81 MIL/uL  Hemoglobin 12.7 (L) 13.0 - 17.0 g/dL   HCT 01.6 (L) 01.0 - 93.2 %   MCV 81.8 80.0 - 100.0 fL   MCH 28.2 26.0 - 34.0 pg   MCHC 34.4 30.0 - 36.0 g/dL   RDW 35.5 73.2 - 20.2 %   Platelets 192 150 - 400 K/uL   nRBC 0.0 0.0 - 0.2 %   Neutrophils Relative % 51 %   Neutro Abs 1.8 1.7 - 7.7 K/uL   Lymphocytes Relative 38 %   Lymphs Abs 1.3 0.7 - 4.0 K/uL   Monocytes Relative 8 %   Monocytes Absolute 0.3 0.1 - 1.0 K/uL   Eosinophils Relative 1 %   Eosinophils Absolute 0.0 0.0 - 0.5 K/uL   Basophils Relative 2 %   Basophils Absolute 0.1 0.0 - 0.1 K/uL   Immature Granulocytes 0 %   Abs Immature Granulocytes 0.01 0.00 - 0.07 K/uL    Comment: Performed at [facility], [address] [place], [place] [address]  Comprehensive metabolic panel     Status: Abnormal   Collection Time: 01/13/22  9:53 AM  Result Value Ref Range   Sodium 138 135 - 145 mmol/L   Potassium 3.9 3.5 - 5.1 mmol/L   Chloride 109 98 - 111 mmol/L   CO2 20 (L) 22 - 32 mmol/L   Glucose, Bld 129 (H) 70 - 99 mg/dL    Comment: Glucose reference range applies only to samples taken after fasting for at least 8 hours.   BUN 7 6 - 20 mg/dL   Creatinine, Ser 6.23 0.61 - 1.24 mg/dL   Calcium 8.7 (L) 8.9 - 10.3 mg/dL   Total Protein 6.9 6.5 - 8.1 g/dL   Albumin 3.8 3.5 - 5.0 g/dL   AST 49 (H) 15 - 41 U/L   ALT 33 0 - 44 U/L   Alkaline Phosphatase 45 38 - 126 U/L    Total Bilirubin 0.8 0.3 - 1.2 mg/dL   GFR, Estimated >76 >28 mL/min    Comment: (NOTE) Calculated using the CKD-EPI Creatinine Equation (2021)    Anion gap 9 5 - 15    Comment: Performed at [facility], [address], [place], [place] [address]  Magnesium     Status: None   Collection Time: 01/13/22  9:53 AM  Result Value Ref Range   Magnesium 1.9 1.7 - 2.4 mg/dL    Comment: Performed at [facility], [address], [place], [place] [address]  Phosphorus     Status: Abnormal   Collection Time: 01/13/22  9:53 AM  Result Value Ref Range   Phosphorus 2.4 (L) 2.5 - 4.6 mg/dL    Comment: Performed at [facility], [address], [place], [place] [address]  Glucose, capillary     Status: Abnormal   Collection Time: 01/13/22 12:41 PM  Result Value Ref Range   Glucose-Capillary 156 (H) 70 - 99 mg/dL    Comment: Glucose reference range applies only to samples taken after fasting for at least 8 hours.    Recent Results (from the past 240 hour(s))  Blood culture (routine x 2)     Status: None (Preliminary result)   Collection Time: 01/11/22  8:08 PM   Specimen: BLOOD RIGHT HAND  Result Value Ref Range Status   Specimen Description BLOOD RIGHT HAND  Final   Special Requests   Final    BOTTLES DRAWN AEROBIC ONLY Blood Culture results may not be optimal due to  an inadequate volume of blood received in culture bottles   Culture   Final    NO GROWTH 2 DAYS Performed at [facility], [place] [address], [place], [place] [address]    Report Status PENDING  Incomplete  Blood culture (routine x 2)     Status: None (Preliminary result)   Collection Time: 01/11/22  8:13 PM   Specimen: BLOOD LEFT HAND  Result Value Ref Range Status   Specimen Description BLOOD LEFT HAND  Final   Special Requests   Final    BOTTLES DRAWN AEROBIC AND ANAEROBIC Blood Culture adequate volume   Culture   Final    NO GROWTH 2 DAYS Performed at [facility], [place] [address],  [place], [place] [address]    Report Status PENDING  Incomplete  C Difficile Quick Screen w PCR reflex     Status: None   Collection Time: 01/12/22  8:35 AM   Specimen: STOOL  Result Value Ref Range Status   C Diff antigen NEGATIVE NEGATIVE Final   C Diff toxin NEGATIVE NEGATIVE Final   C Diff interpretation No C. difficile detected.  Final    Comment: Performed at [facility], [place] [address] [place], [place] [address]    Lipid Panel No results for input(s): "CHOL", "TRIG", "HDL", "CHOLHDL", "VLDL", "LDLCALC" in the last 72 hours.  Studies/Results: Overnight EEG with video  Result Date: 01/12/2022 Lora Havens, MD     01/13/2022 11:14 AM Patient Name: Jonathan [name] MRN: [id number] Epilepsy Attending: Lora Havens Referring Physician/Provider: Amie Portland, MD Duration: 01/11/2022 2245 to 01/12/2022 2245 Patient history: 26 year old with autism, severe cerebral palsy, developmental delay, history of seizures presenting with flurry of seizures that happened this evening. Does not appear to be back to baseline. EEG to evaluate for status epilepticus. Level of alertness:  lethargic AEDs during EEG study: LEV Technical aspects: This EEG study was done with scalp electrodes positioned according to the 10-20 International system of electrode placement. Electrical activity was reviewed with band pass filter of 1-70Hz , sensitivity of 7 uV/mm, display speed of 85mm/sec with a 60Hz  notched filter applied as appropriate. EEG data were recorded continuously and digitally stored.  Video monitoring was available and reviewed as appropriate. Description: At the beginning of study, no clear posterior dominant rhythm was seen. EEG showed continuous generalized 6-9 Hz theta-alpha activity admixed with 13-15hz  generalized beta activity. Abundant generalized and maximal bifrontal spikes were noted which at times appear periodic at 1hz . Seizure without clinical signs was noted starting on 01/12/2022 at 0948  lasting for 10 seconds. Between 10-1028. frequent seizures were noted lasting 10-30 seconds lasting for more than 20% of the recording consistent with non-convulsive  generalized status epilepticus. Concomitant EEG showed generalized sharply contoured 6-7hz  theta slowing which appeared higher in amplitude in right frontal region. EEG then showed evolution in morphology and evolved to 2-3hz  delta slowing admixed with generalized spikes. After 1030 EEG showed near continuous generalized and maximal bifrontal polyspikes. IV ativan was administered on 01/12/2022 at 1534. Subsequently status epilepticus resolved and EEG showed posterior dominant rhythm of 10-11 Hz activity of moderate voltage (25-35 uV) seen predominantly in posterior head regions, symmetric and reactive to eye opening and eye closing. Sleep was characterized by vertex waves, sleep spindles (12 to 14 Hz), maximal frontocentral region. Intermittent generalized 3-6hz  theta-delta slowing was also noted. Hyperventilation and photic stimulation were not performed.   ABNORMALITY - Electrographic status epilepticus, generalized - Spike, generalized - Continuous  slow, generalized IMPRESSION: This study initially showed evidence of epileptogenicity with generalized onset. The spikes at time appear periodic at 1hz  and concerning for ictal-interictal continuum. Between 10 to 1030 am on 11/12/2021, EEG worsened and was consistent with electrographic status epilepticus with generalized onset. Additionally, there is moderate diffuse encephalopathy, nonspecific etiology. IV ativan was administered on 01/12/2022 at 1534. Subsequently status epilepticus resolved and EEG was suggestive of mild diffuse encephalopathy. Dr. Cheral Marker was notified. Lora Havens   CT Head Wo Contrast  Result Date: 01/12/2022 CLINICAL DATA:  Seizure, headache. EXAM: CT HEAD WITHOUT CONTRAST TECHNIQUE: Contiguous axial images were obtained from the base of the skull through the vertex  without intravenous contrast. RADIATION DOSE REDUCTION: This exam was performed according to the departmental dose-optimization program which includes automated exposure control, adjustment of the mA and/or kV according to patient size and/or use of iterative reconstruction technique. COMPARISON:  Head CT 07/08/2015.  MRI brain 08/24/2014. FINDINGS: Brain: No evidence of acute infarction, hemorrhage, hydrocephalus, extra-axial collection or mass lesion/mass effect. Cavum septum pellucidum and vergae again noted, unchanged. Vascular: No hyperdense vessel or unexpected calcification. Skull: Normal. Negative for fracture or focal lesion. Sinuses/Orbits: No acute finding. Other: None. IMPRESSION: No acute intracranial abnormality identified. Electronically Signed   By: Ronney Asters M.D.   On: 01/12/2022 00:12   DG Chest Port 1 View  Result Date: 01/11/2022 CLINICAL DATA:  status epilepticus with secretions ?aspiration EXAM: PORTABLE CHEST 1 VIEW COMPARISON:  Chest x-ray 05/11/2017 FINDINGS: The heart and mediastinal contours are within normal limits. No focal consolidation. No pulmonary edema. No pleural effusion. No pneumothorax. No acute osseous abnormality. IMPRESSION: No active disease. Electronically Signed   By: Iven Finn M.D.   On: 01/11/2022 22:19    Medications: Scheduled:  enoxaparin (LOVENOX) injection  30 mg Subcutaneous Q24H   Continuous:  levETIRAcetam 1,000 mg (01/13/22 1140)    Assessment:  Jonathan [name] is a 26 y.o. male with a past medical history of cerebral palsy, seizures, autism and severe developmental delay, nonverbal at baseline, completely dependent on family for care, patient of [place] neurology on Keppra 250 mg twice daily with good control of seizures at baseline, with missed dose of Keppra a few days ago, now presenting to the emergency room after he had seizures that started at 6 PM and were continuing at the time he was brought to the emergency room by EMS at 8 PM.   EMS gave him Versed and he was given Ativan in the emergency room.  He was also loaded with 1 g of Keppra.  No missed doses except for 1 dose that was missed 3 days ago - his nighttime dose. No recent illnesses.  No sick contacts. - He is now back to baseline - LTM EEG preliminary review at the bedside reveals no electrographic seizures.  - Images from the patient's MRI obtained in 2016 were personally reviewed. 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    Impression: Break through seizures with status epilepticus in a severely developmentally delayed patient with a long-standing history of seizures    Recommendations: - Seizure precautions  - Discontinuing LTM EEG - Decreasing scheduled Keppra dose to 500 mg IV BID. Will defer to outpatient Neurologist regarding possible taper back down to his pre-admission regimen of 250 mg BID.  - Consider possible discharge home today if LTM EEG is normalized based on review by epileptologist (Dr.  Hortense Ramal)  Addendum: - LTM EEG:  Intermittent slow, generalized. This study was suggestive of mild diffuse encephalopathy non specific etiology. No seizures were seen during this study.  - Can be discharged home from a Neurology standpoint - Outpatient neurology follow up.     LOS: 1 day   @Electronically  signed: Dr. Kerney Elbe 01/13/2022  3:28 PM

## 2022-01-13 NOTE — Progress Notes (Signed)
LTM EEG discontinued - no skin breakdown at unhook.   

## 2022-01-13 NOTE — Procedures (Signed)
Patient Name: Jonathan [name]  MRN: [id number]  Epilepsy Attending: Charlsie Quest  Referring Physician/Provider: Milon Dikes, MD  Duration: 01/12/2022 2245 to 01/13/2022 1544   Patient history: 26 year old with autism, severe cerebral palsy, developmental delay, history of seizures presenting with flurry of seizures that happened this evening. Does not appear to be back to baseline. EEG to evaluate for status epilepticus.    Level of alertness: awake, asleep    AEDs during EEG study: LEV   Technical aspects: This EEG study was done with scalp electrodes positioned according to the 10-20 International system of electrode placement. Electrical activity was reviewed with band pass filter of 1-70Hz , sensitivity of 7 uV/mm, display speed of 33mm/sec with a 60Hz  notched filter applied as appropriate. EEG data were recorded continuously and digitally stored.  Video monitoring was available and reviewed as appropriate.   Description: EEG showed posterior dominant rhythm of 10-11 Hz activity of moderate voltage (25-35 uV) seen predominantly in posterior head regions, symmetric and reactive to eye opening and eye closing. Sleep was characterized by vertex waves, sleep spindles (12 to 14 Hz), maximal frontocentral region. Intermittent generalized 3-6hz  theta-delta slowing was also noted. Hyperventilation and photic stimulation were not performed.      ABNORMALITY - Intermittent slow, generalized   IMPRESSION: This study was suggestive of mild diffuse encephalopathy non specific etiology. No seizures were seen during this study.     Markcus Lazenby 

## 2022-01-13 NOTE — Evaluation (Signed)
Clinical/Bedside Swallow Evaluation Patient Details  Name: Jonathan [name] MRN: [id number] Date of Birth: 07/29/1995  Today's Date: 01/13/2022 Time: SLP Start Time (ACUTE ONLY): 0921 SLP Stop Time (ACUTE ONLY): 0927 SLP Time Calculation (min) (ACUTE ONLY): 6 min  Past Medical History:  Past Medical History:  Diagnosis Date   Allergic rhinitis    Autism    CP (cerebral palsy) (HCC)    Mental retardation, idiopathic severe    Pneumonia 06/2015   Seizure (HCC) 08/24/2014   Past Surgical History:  Past Surgical History:  Procedure Laterality Date   HEMATOMA EVACUATION Left 04/21/2019   Procedure: EVACUATION HEMATOMA left ear;  Surgeon: Christia Reading, MD;  Location: [facility];  Service: ENT;  Laterality: Left;   NO PAST SURGERIES     HPI:  Jonathan [name] is a 26 y.o. male who was admitted to [facility] on 01/11/2022 with breakthrough seizures after presenting from home to [facility] ED for evaluation of seizures.  Head CT and MRI 12/15 negative for acute findings.  Pt with medical history significant for cerebral palsy, developmental delay, seizures.    Assessment / Plan / Recommendation  Clinical Impression  Pt presents with grossly normal swallow function as assessed clinically.  Pt tolerate all consistencies trialed with no clinical s/s of aspiration, including serial straw sips of thin liquid.  There was very mild lingual residue with solid which cleared with liquid wash.  Father at bedside states that pt consumes unrestricted diet at baseline.  Pt has no further swallowing needs at this time; SLP will sign off.   Recommend resuming regular texture diet with thin liquids.  SLP Visit Diagnosis: Dysphagia, unspecified (R13.10)    Aspiration Risk  No limitations    Diet Recommendation Regular;Thin liquid   Liquid Administration via: Straw Medication Administration:  (No specific precautions, as tolerated) Supervision: Staff to assist with self feeding Compensations: Slow  rate;Small sips/bites Postural Changes: Seated upright at 90 degrees    Other  Recommendations Oral Care Recommendations: Oral care BID    Recommendations for follow up therapy are one component of a multi-disciplinary discharge planning process, led by the attending physician.  Recommendations may be updated based on patient status, additional functional criteria and insurance authorization.  Follow up Recommendations No SLP follow up      Assistance Recommended at Discharge    Functional Status Assessment Patient has not had a recent decline in their functional status  Frequency and Duration  (N/A)          Prognosis Prognosis for Safe Diet Advancement:  (N/A)      Swallow Study   General Date of Onset: 01/11/22 HPI: Jonathan [name] is a 26 y.o. male who was admitted to [facility] on 01/11/2022 with breakthrough seizures after presenting from home to [facility] ED for evaluation of seizures.  Head CT and MRI 12/15 negative for acute findings.  Pt with medical history significant for cerebral palsy, developmental delay, seizures. Type of Study: Bedside Swallow Evaluation Previous Swallow Assessment: BSE 2016 Diet Prior to this Study: NPO Temperature Spikes Noted: No Respiratory Status: Room air History of Recent Intubation: No Behavior/Cognition: Alert;Cooperative;Pleasant mood Oral Cavity Assessment: Within Functional Limits Oral Care Completed by SLP: No Oral Cavity - Dentition: Adequate natural dentition (implants) Self-Feeding Abilities: Needs assist Patient Positioning: Upright in bed Baseline Vocal Quality: Normal Volitional Cough: Cognitively unable to elicit Volitional Swallow: Unable to elicit    Oral/Motor/Sensory Function Overall Oral Motor/Sensory Function:  (Unable to assess) Facial Symmetry:  Within Functional Limits   Ice Chips Ice chips: Not tested   Thin Liquid Thin Liquid: Within functional limits Presentation: Straw    Nectar Thick Nectar Thick Liquid:  Not tested   Honey Thick Honey Thick Liquid: Not tested   Puree Puree: Within functional limits Presentation: Spoon   Solid     Solid: Within functional limits Presentation:  (SLP fed)      Kerrie Pleasure, MA, CCC-SLP Acute Rehabilitation Services Office: [phone] 01/13/2022,9:37 AM

## 2022-01-13 NOTE — Progress Notes (Signed)
LTM maint complete - no skin breakdown  Patient moved to 3w from 04ED All leads attached, Head wrap remains afixed. Patient is waking up, may need mittens. [facility] monitored, Event button test confirmed by [facility].

## 2022-01-13 NOTE — Progress Notes (Signed)
Hypoglycemic Event  CBG: 34  Treatment: D50 50 mL (25 gm)  Symptoms:  UTA, non-verbal at baseline due to previous medical history.  Follow-up CBG: Time: 0840 CBG Result: 108  Possible Reasons for Event: Inadequate meal intake NPO overnight, no fluids ordered   Comments/MD notified: Dr. Marland Mcalpine paged and notified of hypoglycemia.

## 2022-01-14 LAB — GLUCOSE, CAPILLARY
Glucose-Capillary: 32 mg/dL — CL (ref 70–99)
Glucose-Capillary: 34 mg/dL — CL (ref 70–99)

## 2022-01-15 DIAGNOSIS — R6250 Unspecified lack of expected normal physiological development in childhood: Secondary | ICD-10-CM | POA: Diagnosis not present

## 2022-01-15 DIAGNOSIS — Z758 Other problems related to medical facilities and other health care: Secondary | ICD-10-CM | POA: Diagnosis not present

## 2022-01-15 DIAGNOSIS — G809 Cerebral palsy, unspecified: Secondary | ICD-10-CM | POA: Diagnosis not present

## 2022-01-16 LAB — CULTURE, BLOOD (ROUTINE X 2)
Culture: NO GROWTH
Culture: NO GROWTH
Special Requests: ADEQUATE

## 2022-03-19 DIAGNOSIS — J309 Allergic rhinitis, unspecified: Secondary | ICD-10-CM | POA: Diagnosis not present

## 2022-04-11 ENCOUNTER — Ambulatory Visit: Payer: Medicare Other | Admitting: Adult Health

## 2022-04-15 ENCOUNTER — Ambulatory Visit (INDEPENDENT_AMBULATORY_CARE_PROVIDER_SITE_OTHER): Payer: 59 | Admitting: Adult Health

## 2022-04-15 ENCOUNTER — Encounter: Payer: Self-pay | Admitting: Adult Health

## 2022-04-15 VITALS — BP 125/82 | HR 87 | Ht 69.0 in | Wt 113.6 lb

## 2022-04-15 DIAGNOSIS — R569 Unspecified convulsions: Secondary | ICD-10-CM

## 2022-04-15 NOTE — Patient Instructions (Signed)
Continue Keppra 500 mg twice a day Blood work today If you have any seizure events please let us know.

## 2022-04-15 NOTE — Progress Notes (Signed)
PATIENT: Jonathan [name] DOB: 26-Jul-1995  REASON FOR VISIT: follow up HISTORY FROM: patient PRIMARY NEUROLOGIST: Dr. Brett Fairy  Chief Complaint  Patient presents with   Follow-up    Pt in 68 with caregiver  Pt has developmental delay  Caregiver states pt had seizure Dec 2023 Pt was admitted to hospital for 2 days Caregiver states increased Keppra      HISTORY OF PRESENT ILLNESS: Today 04/15/22:  Jonathan [name] is a 27 y.o. male with a history of seizures and severe developmental delay. Returns today for follow-up.  Patient is here with his caregiver today.  Patient went to the ED in December after missing 1 nighttime dose of Keppra and then having subsequent seizures.  He was in status epilepticus.  He was given Versed and Ativan in the emergency room.  He was discharged on an increased dose of Keppra 500 mg twice a day.  Caregiver reports that he has not had any additional seizure events that they are aware of.  Remains on Keppra 500 mg twice a day     04/09/21: Jonathan [name] is a 27 year old male with a history of seizures.  He returns today for follow-up.  He is here today with his caregiver.  They deny any seizure events.  He remains on Keppra 250 mg twice a day.   10/09/20: Jonathan [name] is a 27 y.o. male with a history of cerebral palsy and seizures. He returns today for a follow up. Patient moved in with his guardian in June. Guardian reports that he was initially unaware of the patients seizure medication and the patient had three episodes in June. Once Jonathan [name] was restarted on the 250 mg Keppra twice a day, his seizures have stopped and there have been no other episodes since. His guardian denies any other changes at this time.    HISTORY 02/24/19: Jonathan [name] is a 27 year old male with a history of cerebral palsy and seizures.  He returns today for follow-up.  He is with his grandmother today.  She reports that his last seizure was in July 2020.  She states that this is when Keppra  was restarted.  She states that since then he has not had any seizure events.  He continues to live at home with her.  She denies any further changes.  He returns today for follow-up.  REVIEW OF SYSTEMS: Out of a complete 14 system review of symptoms, the patient complains only of the following symptoms, and all other reviewed systems are negative.  See HPI  ALLERGIES: No Known Allergies  HOME MEDICATIONS: Outpatient Medications Prior to Visit  Medication Sig Dispense Refill   cetirizine (ZYRTEC) 1 MG/ML syrup GIVE 10ML'S BY MOUTH AT BEDTIME AS NEEDED FOR ALLERGY SYMPTOM CONTROL (Patient taking differently: Take 10 mg by mouth daily as needed.) 240 mL 1   diazepam (DIASAT) 20 MG GEL Place 20 mg rectally as needed. 1 Package 5   levETIRAcetam (KEPPRA) 100 MG/ML solution Take 5 mLs (500 mg total) by mouth 2 (two) times daily. CALL [phone] TO SCHEDULE FOLLOW UP FOR FUTURE REFILLS 300 mL 0   No facility-administered medications prior to visit.    PAST MEDICAL HISTORY: Past Medical History:  Diagnosis Date   Allergic rhinitis    Autism    CP (cerebral palsy) ([place])    Mental retardation, idiopathic severe    Pneumonia 06/2015   Seizure ([place]) 08/24/2014    PAST SURGICAL HISTORY: Past Surgical History:  Procedure Laterality Date   HEMATOMA EVACUATION  Left 04/21/2019   Procedure: EVACUATION HEMATOMA left ear;  Surgeon: Melida Quitter, MD;  Location: [facility];  Service: ENT;  Laterality: Left;   NO PAST SURGERIES      FAMILY HISTORY: Family History  Problem Relation Age of Onset   Arthritis Paternal Grandmother    Obesity Other    Stroke Other    Hypertension Other    Seizures Maternal Uncle     SOCIAL HISTORY: Social History   Socioeconomic History   Marital status: Single    Spouse name: Not on file   Number of children: Not on file   Years of education: Not on file   Highest education level: Not on file  Occupational History   Not on file  Tobacco  Use   Smoking status: Never   Smokeless tobacco: Never  Vaping Use   Vaping Use: Never used  Substance and Sexual Activity   Alcohol use: No   Drug use: No   Sexual activity: Never    Birth control/protection: Abstinence  Other Topics Concern   Not on file  Social History Narrative   Not on file   Social Determinants of Health   Financial Resource Strain: Not on file  Food Insecurity: No Food Insecurity (01/12/2022)   Hunger Vital Sign    Worried About Running Out of Food in the Last Year: Never true    Ran Out of Food in the Last Year: Never true  Transportation Needs: No Transportation Needs (01/12/2022)   PRAPARE - Hydrologist (Medical): No    Lack of Transportation (Non-Medical): No  Physical Activity: Not on file  Stress: Not on file  Social Connections: Not on file  Intimate Partner Violence: Not At Risk (01/12/2022)   Humiliation, Afraid, Rape, and Kick questionnaire    Fear of Current or Ex-Partner: No    Emotionally Abused: No    Physically Abused: No    Sexually Abused: No    PHYSICAL EXAM  Vitals:   04/15/22 1110  BP: 125/82  Pulse: 87  Weight: 113 lb 9.6 oz (51.5 kg)  Height: 5\' 9"  (1.753 m)    Body mass index is 16.78 kg/m.  Generalized: Well developed, in no acute distress   Neurological examination  Mentation: Alert. Does not follow any commands Cranial nerve II-XII: Pupils were equal round reactive to light.  Motor: The motor testing reveals 5 over 5 strength of all 4 extremities. Good symmetric motor tone is noted throughout.  Sensory: UTA Coordination: UTA Gait and station: patient walks on his toes with a forced arch   DIAGNOSTIC DATA (LABS, IMAGING, TESTING) - I reviewed patient records, labs, notes, testing and imaging myself where available.  Lab Results  Component Value Date   WBC 3.4 (L) 01/13/2022   HGB 12.7 (L) 01/13/2022   HCT 36.9 (L) 01/13/2022   MCV 81.8 01/13/2022   PLT 192 01/13/2022       Component Value Date/Time   NA 138 01/13/2022 0953   K 3.9 01/13/2022 0953   CL 109 01/13/2022 0953   CO2 20 (L) 01/13/2022 0953   GLUCOSE 129 (H) 01/13/2022 0953   BUN 7 01/13/2022 0953   CREATININE 0.93 01/13/2022 0953   CREATININE 0.64 03/17/2015 0954   CALCIUM 8.7 (L) 01/13/2022 0953   PROT 6.9 01/13/2022 0953   ALBUMIN 3.8 01/13/2022 0953   AST 49 (H) 01/13/2022 0953   ALT 33 01/13/2022 0953   ALKPHOS 45 01/13/2022 [id number]  BILITOT 0.8 01/13/2022 0953   GFRNONAA >60 01/13/2022 0953   GFRAA >60 10/09/2018 1057     ASSESSMENT AND PLAN 27 y.o. year old male  has a past medical history of Allergic rhinitis, Autism, CP (cerebral palsy) ([place]), Mental retardation, idiopathic severe, Pneumonia (06/2015), and Seizure ([place]) (08/24/2014). here with    1: Seizures  - Continue Keppra 500 mg twice a day  - Advised if he has any seizure events he should let us know - Follow-up in 1 year or sooner if needed.    Ward Givens, MSN, NP-C 04/15/2022, 11:10 AM [facility] [address] [place], [place] [address] [phone]

## 2022-04-30 ENCOUNTER — Encounter (HOSPITAL_BASED_OUTPATIENT_CLINIC_OR_DEPARTMENT_OTHER): Payer: Self-pay | Admitting: Emergency Medicine

## 2022-04-30 ENCOUNTER — Other Ambulatory Visit: Payer: Self-pay

## 2022-04-30 ENCOUNTER — Emergency Department (HOSPITAL_BASED_OUTPATIENT_CLINIC_OR_DEPARTMENT_OTHER)
Admission: EM | Admit: 2022-04-30 | Discharge: 2022-04-30 | Disposition: A | Discharge status: Home / Self Care | Payer: 59 | Attending: Emergency Medicine | Admitting: Emergency Medicine

## 2022-04-30 DIAGNOSIS — R569 Unspecified convulsions: Secondary | ICD-10-CM

## 2022-04-30 DIAGNOSIS — W01198A Fall on same level from slipping, tripping and stumbling with subsequent striking against other object, initial encounter: Secondary | ICD-10-CM | POA: Insufficient documentation

## 2022-04-30 DIAGNOSIS — S01112A Laceration without foreign body of left eyelid and periocular area, initial encounter: Secondary | ICD-10-CM | POA: Diagnosis not present

## 2022-04-30 DIAGNOSIS — Z23 Encounter for immunization: Secondary | ICD-10-CM | POA: Diagnosis not present

## 2022-04-30 DIAGNOSIS — G40909 Epilepsy, unspecified, not intractable, without status epilepticus: Secondary | ICD-10-CM | POA: Diagnosis not present

## 2022-04-30 DIAGNOSIS — F840 Autistic disorder: Secondary | ICD-10-CM | POA: Insufficient documentation

## 2022-04-30 MED ORDER — LIDOCAINE-EPINEPHRINE (PF) 2 %-1:200000 IJ SOLN
20.0000 mL | Freq: Once | INTRAMUSCULAR | Status: AC
  Administered 2022-04-30: 20 mL
  Filled 2022-04-30: qty 20

## 2022-04-30 MED ORDER — TETANUS-DIPHTH-ACELL PERTUSSIS 5-2.5-18.5 LF-MCG/0.5 IM SUSY
0.5000 mL | PREFILLED_SYRINGE | Freq: Once | INTRAMUSCULAR | Status: AC
  Administered 2022-04-30: 0.5 mL via INTRAMUSCULAR
  Filled 2022-04-30: qty 0.5

## 2022-04-30 MED ORDER — LIDOCAINE-EPINEPHRINE-TETRACAINE (LET) TOPICAL GEL
3.0000 mL | Freq: Once | TOPICAL | Status: AC
  Administered 2022-04-30: 3 mL via TOPICAL
  Filled 2022-04-30: qty 3

## 2022-04-30 NOTE — ED Notes (Signed)
Discharge paperwork reviewed entirely with patient, including Rx's and follow up care. Pain was under control. Caretaker verbalized understanding as well as all parties involved. No questions or concerns voiced at the time of discharge. No acute distress noted.   Pt was wheeled out to the PVA in a wheelchair without incident.

## 2022-04-30 NOTE — Discharge Instructions (Addendum)
It was our pleasure to provide your ER care today - we hope that you feel better.  Continue seizure meds. Follow up with your doctor/neurologist in the next 1-2 weeks.   Have sutures removed, your doctor or urgent care, in one week.   Return to ER if worse, new symptoms, recurrent seizures, change in mental status, persistent vomiting, infection of wound, or other concern.

## 2022-04-30 NOTE — ED Triage Notes (Signed)
Had seizure this morning , fell forward face down , left eyebrow laceration . Presents with pressure band aid , bleeding controlled . Hx seizure , took his meds this morning . Pt alert and wheeled in triage .

## 2022-04-30 NOTE — ED Provider Notes (Signed)
[name] AT [facility] Provider Note   CSN: [id number] Arrival date & time: 04/30/22  1100     History  Chief Complaint  Patient presents with   Facial Injury    Jonathan [name] is a 27 y.o. male.  Pt with hx seizures, cerebral palsy, DD/autism, w seizure this AM at table, hit head/left eyebrow w laceration to area. No recent increase in seizure frequency, has occasional seizure despite meds. Compliant w meds/keppra and has adequate supply at home. No recent change in meds/doses. No recent febrile illness. Has been acting normally, eating and drinking per normal. Seems like normal self this AM, and currently reported at baseline. Last tetanus not known.   The history is provided by the patient and medical records (guardian). The history is limited by the condition of the patient.  Facial Injury Associated symptoms: no vomiting        Home Medications Prior to Admission medications   Medication Sig Start Date End Date Taking? Authorizing Provider  cetirizine (ZYRTEC) 1 MG/ML syrup GIVE 10ML'S BY MOUTH AT BEDTIME AS NEEDED FOR ALLERGY SYMPTOM CONTROL Patient taking differently: Take 10 mg by mouth daily as needed. 05/17/16   Lurlean Leyden, MD  diazepam (DIASAT) 20 MG GEL Place 20 mg rectally as needed. 08/12/18   Dohmeier, Asencion Partridge, MD  levETIRAcetam (KEPPRA) 100 MG/ML solution Take 5 mLs (500 mg total) by mouth 2 (two) times daily. CALL [phone] TO SCHEDULE FOLLOW UP FOR FUTURE REFILLS 01/13/22   Raiford Noble Latif, DO      Allergies    Patient has no known allergies.    Review of Systems   Review of Systems  Unable to perform ROS: Other  Constitutional:  Negative for fever.  Respiratory:  Negative for cough and shortness of breath.   Gastrointestinal:  Negative for diarrhea and vomiting.  Genitourinary:  Negative for decreased urine volume and dysuria.  Skin:  Positive for wound.  Neurological:  Positive for seizures.       Non verbal  at baseline.  Patient acting, functioning at baseline per fam.    Physical Exam Updated Vital Signs BP (!) 127/95   Pulse (!) 108   Temp (!) 96.2 F (35.7 C)   Resp 16   Wt 52 kg   SpO2 96%   BMI 16.93 kg/m  Physical Exam Vitals and nursing note reviewed.  Constitutional:      Appearance: Normal appearance. He is well-developed.  HENT:     Head:     Comments: 3 cm lac to left eyebrow area. Facial bones/orbits grossly intact bil.    Nose: Nose normal.     Mouth/Throat:     Mouth: Mucous membranes are moist.     Pharynx: Oropharynx is clear.  Eyes:     General: No scleral icterus.    Extraocular Movements: Extraocular movements intact.     Conjunctiva/sclera: Conjunctivae normal.     Pupils: Pupils are equal, round, and reactive to light.  Neck:     Trachea: No tracheal deviation.  Cardiovascular:     Rate and Rhythm: Normal rate and regular rhythm.     Pulses: Normal pulses.     Heart sounds: Normal heart sounds. No murmur heard.    No friction rub. No gallop.  Pulmonary:     Effort: Pulmonary effort is normal. No accessory muscle usage or respiratory distress.     Breath sounds: Normal breath sounds.  Chest:     Chest wall: No  tenderness.  Abdominal:     General: Bowel sounds are normal. There is no distension.     Palpations: Abdomen is soft.     Tenderness: There is no abdominal tenderness.  Genitourinary:    Comments: No cva tenderness. Musculoskeletal:        General: No swelling.     Cervical back: Normal range of motion and neck supple. No rigidity.     Comments: CTLS spine, non tender, aligned, no step off. Good rom bil extremities without focal pain or bony tenderness.   Skin:    General: Skin is warm and dry.     Findings: No rash.  Neurological:     Mental Status: He is alert.     Comments: Alert, content appearing. Pts mental status and functional ability reported at baseline per guardian. Moves bil extremities purposefully.   Psychiatric:         Mood and Affect: Mood normal.     ED Results / Procedures / Treatments   Labs (all labs ordered are listed, but only abnormal results are displayed) Labs Reviewed - No data to display  EKG None  Radiology No results found.  Procedures .Marland KitchenLaceration Repair  Date/Time: 04/30/2022 2:27 PM  Performed by: Lajean Saver, MD Authorized by: Lajean Saver, MD   Consent:    Consent given by:  Patient and guardian Anesthesia:    Anesthesia method:  Local infiltration and topical application   Topical anesthetic:  LET   Local anesthetic:  Lidocaine 2% WITH epi Laceration details:    Location:  Face   Face location:  L eyebrow   Length (cm):  3 Pre-procedure details:    Preparation:  Patient was prepped and draped in usual sterile fashion Exploration:    Wound exploration: wound explored through full range of motion and entire depth of wound visualized     Contaminated: no   Treatment:    Area cleansed with:  Saline   Amount of cleaning:  Standard   Irrigation solution:  Sterile saline   Visualized foreign bodies/material removed: no   Skin repair:    Repair method:  Sutures   Suture size:  6-0   Suture material:  Prolene   Suture technique:  Simple interrupted   Number of sutures:  3 Approximation:    Approximation:  Close Post-procedure details:    Dressing:  Antibiotic ointment   Procedure completion:  Tolerated well, no immediate complications     Medications Ordered in ED Medications  lidocaine-EPINEPHrine (XYLOCAINE W/EPI) 2 %-1:200000 (PF) injection 20 mL (20 mLs Infiltration Given by Other 04/30/22 1341)  lidocaine-EPINEPHrine-tetracaine (LET) topical gel (3 mLs Topical Given 04/30/22 1341)  Tdap (BOOSTRIX) injection 0.5 mL (0.5 mLs Intramuscular Given 04/30/22 1341)    ED Course/ Medical Decision Making/ A&P                             Medical Decision Making Problems Addressed: Laceration of left eyebrow, initial encounter: acute illness or injury Seizure:  acute illness or injury with systemic symptoms that poses a threat to life or bodily functions Seizure disorder: chronic illness or injury with exacerbation, progression, or side effects of treatment that poses a threat to life or bodily functions  Amount and/or Complexity of Data Reviewed Independent Historian: guardian    Details: hx External Data Reviewed: notes.  Risk Prescription drug management.   Reviewed nursing notes and prior charts for additional history.   Tetanus IM. LET.  Wound repair.   Pts mental status/function continues to be c/w baseline per guardian. Pt content, no distress. Vitals normal (hr 88, rr 16).   Pt currently appears stable for d/c.   Return precautions provided.             Final Clinical Impression(s) / ED Diagnoses Final diagnoses:  None    Rx / DC Orders ED Discharge Orders     None         Lajean Saver, MD 04/30/22 1433

## 2022-05-06 ENCOUNTER — Encounter: Payer: Self-pay | Admitting: Adult Health

## 2022-05-06 ENCOUNTER — Ambulatory Visit (INDEPENDENT_AMBULATORY_CARE_PROVIDER_SITE_OTHER): Payer: 59 | Admitting: Adult Health

## 2022-05-06 VITALS — BP 124/79 | HR 93 | Ht 69.0 in | Wt 116.5 lb

## 2022-05-06 DIAGNOSIS — R569 Unspecified convulsions: Secondary | ICD-10-CM

## 2022-05-06 MED ORDER — LEVETIRACETAM 100 MG/ML PO SOLN: 750.0000 mg | Freq: Two times a day (BID) | ORAL | 11 refills | Status: DC

## 2022-05-06 NOTE — Progress Notes (Signed)
PATIENT: Jonathan [name] DOB: 07/18/1995  REASON FOR VISIT: follow up HISTORY FROM: patient PRIMARY NEUROLOGIST: Dr. Vickey Huger  Chief Complaint  Patient presents with   Follow-up    Pt in 20 with caregiver  Caregiver states pt had seizure Tuesday of last week Pt fell out of chair and at laceration on left eyebrow Pt had to go to ED to get stiches no hospital stay required      HISTORY OF PRESENT ILLNESS: Today 05/06/22:  Jonathan [name] is a 27 y.o. male with a history of Seizure. Returns today for follow-up.  Caregiver reports that patient had a seizure last Tuesday fell out of his chair and had a laceration of the left eyebrow.  He did go to the ED.  They did not make any adjustments to his medication.  Caregiver reports that he has been compliant with Keppra.  Currently takes Keppra 500 mg twice a day.     04/15/22: Jonathan [name] is a 27 y.o. male with a history of seizures and severe developmental delay. Returns today for follow-up.  Patient is here with his caregiver today.  Patient went to the ED in December after missing 1 nighttime dose of Keppra and then having subsequent seizures.  He was in status epilepticus.  He was given Versed and Ativan in the emergency room.  He was discharged on an increased dose of Keppra 500 mg twice a day.  Caregiver reports that he has not had any additional seizure events that they are aware of.  Remains on Keppra 500 mg twice a day     04/09/21: Jonathan [name] is a 27 year old male with a history of seizures.  He returns today for follow-up.  He is here today with his caregiver.  They deny any seizure events.  He remains on Keppra 250 mg twice a day.   10/09/20: Jonathan [name] is a 27 y.o. male with a history of cerebral palsy and seizures. He returns today for a follow up. Patient moved in with his guardian in June. Guardian reports that he was initially unaware of the patients seizure medication and the patient had three episodes in June. Once Jonathan [name] was  restarted on the 250 mg Keppra twice a day, his seizures have stopped and there have been no other episodes since. His guardian denies any other changes at this time.    HISTORY 02/24/19: Jonathan [name] is a 27 year old male with a history of cerebral palsy and seizures.  He returns today for follow-up.  He is with his grandmother today.  She reports that his last seizure was in July 2020.  She states that this is when Keppra was restarted.  She states that since then he has not had any seizure events.  He continues to live at home with her.  She denies any further changes.  He returns today for follow-up.  REVIEW OF SYSTEMS: Out of a complete 14 system review of symptoms, the patient complains only of the following symptoms, and all other reviewed systems are negative.  See HPI  ALLERGIES: No Known Allergies  HOME MEDICATIONS: Outpatient Medications Prior to Visit  Medication Sig Dispense Refill   cetirizine (ZYRTEC) 1 MG/ML syrup GIVE 10ML'S BY MOUTH AT BEDTIME AS NEEDED FOR ALLERGY SYMPTOM CONTROL (Patient taking differently: Take 10 mg by mouth daily as needed.) 240 mL 1   levETIRAcetam (KEPPRA) 100 MG/ML solution Take 5 mLs (500 mg total) by mouth 2 (two) times daily. CALL [phone] TO SCHEDULE FOLLOW UP FOR FUTURE  REFILLS 300 mL 0   diazepam (DIASAT) 20 MG GEL Place 20 mg rectally as needed. (Patient not taking: Reported on 05/06/2022) 1 Package 5   No facility-administered medications prior to visit.    PAST MEDICAL HISTORY: Past Medical History:  Diagnosis Date   Allergic rhinitis    Autism    CP (cerebral palsy)    Mental retardation, idiopathic severe    Pneumonia 06/2015   Seizure 08/24/2014    PAST SURGICAL HISTORY: Past Surgical History:  Procedure Laterality Date   HEMATOMA EVACUATION Left 04/21/2019   Procedure: EVACUATION HEMATOMA left ear;  Surgeon: Christia Reading, MD;  Location: [facility];  Service: ENT;  Laterality: Left;   NO PAST SURGERIES       FAMILY HISTORY: Family History  Problem Relation Age of Onset   Arthritis Paternal Grandmother    Obesity Other    Stroke Other    Hypertension Other    Seizures Maternal Uncle     SOCIAL HISTORY: Social History   Socioeconomic History   Marital status: Single    Spouse name: Not on file   Number of children: Not on file   Years of education: Not on file   Highest education level: Not on file  Occupational History   Not on file  Tobacco Use   Smoking status: Never   Smokeless tobacco: Never  Vaping Use   Vaping Use: Never used  Substance and Sexual Activity   Alcohol use: No   Drug use: No   Sexual activity: Never    Birth control/protection: Abstinence  Other Topics Concern   Not on file  Social History Narrative   Not on file   Social Determinants of Health   Financial Resource Strain: Not on file  Food Insecurity: No Food Insecurity (01/12/2022)   Hunger Vital Sign    Worried About Running Out of Food in the Last Year: Never true    Ran Out of Food in the Last Year: Never true  Transportation Needs: No Transportation Needs (01/12/2022)   PRAPARE - Administrator, Civil Service (Medical): No    Lack of Transportation (Non-Medical): No  Physical Activity: Not on file  Stress: Not on file  Social Connections: Not on file  Intimate Partner Violence: Not At Risk (01/12/2022)   Humiliation, Afraid, Rape, and Kick questionnaire    Fear of Current or Ex-Partner: No    Emotionally Abused: No    Physically Abused: No    Sexually Abused: No    PHYSICAL EXAM  Vitals:   05/06/22 0850  BP: 124/79  Pulse: 93  Weight: 116 lb 8 oz (52.8 kg)  Height: 5\' 9"  (1.753 m)    Body mass index is 17.2 kg/m.  Generalized: Well developed, in no acute distress   Neurological examination  Mentation: Alert. Does not follow any commands Cranial nerve II-XII: Pupils were equal round reactive to light.  Motor: The motor testing reveals 5 over 5 strength of  all 4 extremities. Good symmetric motor tone is noted throughout.  Sensory: UTA Coordination: UTA Gait and station: patient walks on his toes with a forced arch   DIAGNOSTIC DATA (LABS, IMAGING, TESTING) - I reviewed patient records, labs, notes, testing and imaging myself where available.  Lab Results  Component Value Date   WBC 3.4 (L) 01/13/2022   HGB 12.7 (L) 01/13/2022   HCT 36.9 (L) 01/13/2022   MCV 81.8 01/13/2022   PLT 192 01/13/2022  Component Value Date/Time   NA 138 01/13/2022 0953   K 3.9 01/13/2022 0953   CL 109 01/13/2022 0953   CO2 20 (L) 01/13/2022 0953   GLUCOSE 129 (H) 01/13/2022 0953   BUN 7 01/13/2022 0953   CREATININE 0.93 01/13/2022 0953   CREATININE 0.64 03/17/2015 0954   CALCIUM 8.7 (L) 01/13/2022 0953   PROT 6.9 01/13/2022 0953   ALBUMIN 3.8 01/13/2022 0953   AST 49 (H) 01/13/2022 0953   ALT 33 01/13/2022 0953   ALKPHOS 45 01/13/2022 0953   BILITOT 0.8 01/13/2022 0953   GFRNONAA >60 01/13/2022 0953   GFRAA >60 10/09/2018 1057     ASSESSMENT AND PLAN 27 y.o. year old male  has a past medical history of Allergic rhinitis, Autism, CP (cerebral palsy), Mental retardation, idiopathic severe, Pneumonia (06/2015), and Seizure (08/24/2014). here with    1: Seizures  - Increase Keppra 750 mg twice a day  - Advised if he has any seizure events he should let us know - Follow-up in 1 year or sooner if needed.    Butch PennyMegan Akane Tessier, MSN, NP-C 05/06/2022, 9:06 AM [facility] [address], [address] [place], [place] [id number] [phone].  She

## 2022-05-08 DIAGNOSIS — R519 Headache, unspecified: Secondary | ICD-10-CM | POA: Diagnosis not present

## 2022-05-08 DIAGNOSIS — S0181XA Laceration without foreign body of other part of head, initial encounter: Secondary | ICD-10-CM | POA: Diagnosis not present

## 2022-06-18 DIAGNOSIS — J302 Other seasonal allergic rhinitis: Secondary | ICD-10-CM | POA: Diagnosis not present

## 2022-12-05 ENCOUNTER — Ambulatory Visit: Payer: 59 | Admitting: Adult Health

## 2022-12-05 ENCOUNTER — Encounter: Payer: Self-pay | Admitting: Adult Health

## 2022-12-05 VITALS — BP 119/70 | HR 95

## 2022-12-05 DIAGNOSIS — R569 Unspecified convulsions: Secondary | ICD-10-CM | POA: Diagnosis not present

## 2022-12-05 NOTE — Progress Notes (Signed)
PATIENT: Jonathan [name] DOB: 12-03-1995  REASON FOR VISIT: follow up HISTORY FROM: patient PRIMARY NEUROLOGIST: Dr. Vickey Huger  Chief Complaint  Patient presents with   Follow-up    Pt in 9 with caregiver   Pt is non verbal Pt here for seizure f/u  Caregiver states no seizures since last office visit Caregiver has some paperwork to fill out from [facility]      HISTORY OF PRESENT ILLNESS: Today 12/05/22:  Jonathan [name] is a 27 y.o. male with a history of seizure. Returns today for follow-up.  Patient is here with his caregiver.  He is nonverbal.  They report no seizure events.  He remains on Keppra 500 mg twice a day.  No new symptoms.  Returns today for follow-up.   05/06/22: Jonathan [name] is a 27 y.o. male with a history of Seizure. Returns today for follow-up.  Caregiver reports that patient had a seizure last Tuesday fell out of his chair and had a laceration of the left eyebrow.  He did go to the ED.  They did not make any adjustments to his medication.  Caregiver reports that he has been compliant with Keppra.  Currently takes Keppra 500 mg twice a day.   04/15/22: Jonathan [name] is a 26 y.o. male with a history of seizures and severe developmental delay. Returns today for follow-up.  Patient is here with his caregiver today.  Patient went to the ED in December after missing 1 nighttime dose of Keppra and then having subsequent seizures.  He was in status epilepticus.  He was given Versed and Ativan in the emergency room.  He was discharged on an increased dose of Keppra 500 mg twice a day.  Caregiver reports that he has not had any additional seizure events that they are aware of.  Remains on Keppra 500 mg twice a day     04/09/21: Jonathan [name] is a 27 year old male with a history of seizures.  He returns today for follow-up.  He is here today with his caregiver.  They deny any seizure events.  He remains on Keppra 250 mg twice a day.   10/09/20: Jonathan [name] is a 27 y.o. male with a history of  cerebral palsy and seizures. He returns today for a follow up. Patient moved in with his guardian in June. Guardian reports that he was initially unaware of the patients seizure medication and the patient had three episodes in June. Once Jonathan [name] was restarted on the 250 mg Keppra twice a day, his seizures have stopped and there have been no other episodes since. His guardian denies any other changes at this time.    HISTORY 02/24/19: Jonathan [name] is a 27 year old male with a history of cerebral palsy and seizures.  He returns today for follow-up.  He is with his grandmother today.  She reports that his last seizure was in July 2020.  She states that this is when Keppra was restarted.  She states that since then he has not had any seizure events.  He continues to live at home with her.  She denies any further changes.  He returns today for follow-up.  REVIEW OF SYSTEMS: Out of a complete 14 system review of symptoms, the patient complains only of the following symptoms, and all other reviewed systems are negative.  See HPI  ALLERGIES: No Known Allergies  HOME MEDICATIONS: Outpatient Medications Prior to Visit  Medication Sig Dispense Refill   cetirizine (ZYRTEC) 1 MG/ML syrup GIVE 10ML'S BY MOUTH  AT BEDTIME AS NEEDED FOR ALLERGY SYMPTOM CONTROL (Patient taking differently: Take 10 mg by mouth daily as needed.) 240 mL 1   levETIRAcetam (KEPPRA) 100 MG/ML solution Take 7.5 mLs (750 mg total) by mouth 2 (two) times daily. 450 mL 11   diazepam (DIASAT) 20 MG GEL Place 20 mg rectally as needed. (Patient not taking: Reported on 05/06/2022) 1 Package 5   No facility-administered medications prior to visit.    PAST MEDICAL HISTORY: Past Medical History:  Diagnosis Date   Allergic rhinitis    Autism    CP (cerebral palsy) (HCC)    Mental retardation, idiopathic severe    Pneumonia 06/2015   Seizure (HCC) 08/24/2014    PAST SURGICAL HISTORY: Past Surgical History:  Procedure Laterality Date    HEMATOMA EVACUATION Left 04/21/2019   Procedure: EVACUATION HEMATOMA left ear;  Surgeon: Christia Reading, MD;  Location: [facility];  Service: ENT;  Laterality: Left;   NO PAST SURGERIES      FAMILY HISTORY: Family History  Problem Relation Age of Onset   Arthritis Paternal Grandmother    Obesity Other    Stroke Other    Hypertension Other    Seizures Maternal Uncle     SOCIAL HISTORY: Social History   Socioeconomic History   Marital status: Single    Spouse name: Not on file   Number of children: Not on file   Years of education: Not on file   Highest education level: Not on file  Occupational History   Not on file  Tobacco Use   Smoking status: Never   Smokeless tobacco: Never  Vaping Use   Vaping status: Never Used  Substance and Sexual Activity   Alcohol use: No   Drug use: No   Sexual activity: Never    Birth control/protection: Abstinence  Other Topics Concern   Not on file  Social History Narrative   Not on file   Social Determinants of Health   Financial Resource Strain: Not on file  Food Insecurity: No Food Insecurity (01/12/2022)   Hunger Vital Sign    Worried About Running Out of Food in the Last Year: Never true    Ran Out of Food in the Last Year: Never true  Transportation Needs: No Transportation Needs (01/12/2022)   PRAPARE - Administrator, Civil Service (Medical): No    Lack of Transportation (Non-Medical): No  Physical Activity: Not on file  Stress: Not on file  Social Connections: Not on file  Intimate Partner Violence: Not At Risk (01/12/2022)   Humiliation, Afraid, Rape, and Kick questionnaire    Fear of Current or Ex-Partner: No    Emotionally Abused: No    Physically Abused: No    Sexually Abused: No    PHYSICAL EXAM  Vitals:   12/05/22 1102  BP: 119/70  Pulse: 95     Generalized: Well developed, in no acute distress   Neurological examination  Mentation: Alert. Does not follow any commands.  nonverbal Cranial nerve II-XII: Pupils were equal round reactive to light.  Motor: The motor testing reveals 5 over 5 strength of all 4 extremities. Good symmetric motor tone is noted throughout.  Sensory: UTA Coordination: UTA Gait and station: patient walks on his toes with a forced arch   DIAGNOSTIC DATA (LABS, IMAGING, TESTING) - I reviewed patient records, labs, notes, testing and imaging myself where available.  Lab Results  Component Value Date   WBC 3.4 (L) 01/13/2022   HGB 12.7 (  L) 01/13/2022   HCT 36.9 (L) 01/13/2022   MCV 81.8 01/13/2022   PLT 192 01/13/2022      Component Value Date/Time   NA 138 01/13/2022 0953   K 3.9 01/13/2022 0953   CL 109 01/13/2022 0953   CO2 20 (L) 01/13/2022 0953   GLUCOSE 129 (H) 01/13/2022 0953   BUN 7 01/13/2022 0953   CREATININE 0.93 01/13/2022 0953   CREATININE 0.64 03/17/2015 0954   CALCIUM 8.7 (L) 01/13/2022 0953   PROT 6.9 01/13/2022 0953   ALBUMIN 3.8 01/13/2022 0953   AST 49 (H) 01/13/2022 0953   ALT 33 01/13/2022 0953   ALKPHOS 45 01/13/2022 0953   BILITOT 0.8 01/13/2022 0953   GFRNONAA >60 01/13/2022 0953   GFRAA >60 10/09/2018 1057     ASSESSMENT AND PLAN 27 y.o. year old male  has a past medical history of Allergic rhinitis, Autism, CP (cerebral palsy) (HCC), Mental retardation, idiopathic severe, Pneumonia (06/2015), and Seizure (HCC) (08/24/2014). here with    1: Seizures  - Continue Keppra 750 mg twice a day  - Advised if he has any seizure events he should let us know - Follow-up in 1 year or sooner if needed.    Butch Penny, MSN, NP-C 12/05/2022, 11:17 AM [facility] [address] [place], [place] [address] [phone].  She

## 2023-01-01 ENCOUNTER — Other Ambulatory Visit: Payer: Self-pay | Admitting: Adult Health

## 2023-09-08 ENCOUNTER — Other Ambulatory Visit: Payer: Self-pay | Admitting: Adult Health

## 2023-12-08 ENCOUNTER — Ambulatory Visit: Payer: 59 | Admitting: Adult Health

## 2023-12-08 ENCOUNTER — Encounter: Payer: Self-pay | Admitting: Adult Health

## 2023-12-08 VITALS — BP 141/91 | HR 96 | Ht 69.0 in | Wt 134.4 lb

## 2023-12-08 DIAGNOSIS — R569 Unspecified convulsions: Secondary | ICD-10-CM | POA: Diagnosis not present

## 2023-12-08 MED ORDER — LEVETIRACETAM 100 MG/ML PO SOLN: ORAL | 4 refills | Status: AC

## 2023-12-08 NOTE — Patient Instructions (Signed)
 Continue Keppra  750 mg twice a day Blood work today If you have any seizure events please let us  know.   SEIZURE PRECAUTIONS Per [place] statutes, patients with seizures are not allowed to drive until they have been seizure-free for six months.    Use caution when using heavy equipment or power tools. Avoid working on ladders or at heights. Take showers instead of baths. Ensure the water temperature is not too high on the home water heater. Do not go swimming alone. Do not lock yourself in a room alone (i.e. bathroom). When caring for infants or small children, sit down when holding, feeding, or changing them to minimize risk of injury to the child in the event you have a seizure. Maintain good sleep hygiene. Avoid alcohol.    If patient has another seizure, call 911 and bring them back to the ED if: A.  The seizure lasts longer than 5 minutes.      B.  The patient doesn't wake shortly after the seizure or has new problems such as difficulty seeing, speaking or moving following the seizure C.  The patient was injured during the seizure D.  The patient has a temperature over 102 F (39C) E.  The patient vomited during the seizure and now is having trouble breathing

## 2023-12-08 NOTE — Progress Notes (Signed)
 PATIENT: Jonathan [name] DOB: 28-Mar-1995  REASON FOR VISIT: follow up HISTORY FROM: patient PRIMARY NEUROLOGIST: Dr. Chalice  Chief Complaint  Patient presents with   Follow-up    Pt in 18 with caregiver Pt here for seizure f/u Caregiver states no seizure since last office visit      HISTORY OF PRESENT ILLNESS: Today 12/08/23:  Jonathan [name] is a 28 y.o. male with a history of seizures. Returns today for follow-up.  He is here today with his caregiver.  Caregiver reports that he has not had any seizure events.  Patient is nonverbal.  He continues on Keppra  750 mg twice a day.  Denies any new medical history.  Returns today for an evaluation.    12/05/22: Jonathan [name] is a 28 y.o. male with a history of seizure. Returns today for follow-up.  Patient is here with his caregiver.  He is nonverbal.  They report no seizure events.  He remains on Keppra  500 mg twice a day.  No new symptoms.  Returns today for follow-up.   05/06/22: Jonathan [name] is a 28 y.o. male with a history of Seizure. Returns today for follow-up.  Caregiver reports that patient had a seizure last Tuesday fell out of his chair and had a laceration of the left eyebrow.  He did go to the ED.  They did not make any adjustments to his medication.  Caregiver reports that he has been compliant with Keppra .  Currently takes Keppra  500 mg twice a day.   04/15/22: Jonathan [name] is a 28 y.o. male with a history of seizures and severe developmental delay. Returns today for follow-up.  Patient is here with his caregiver today.  Patient went to the ED in December after missing 1 nighttime dose of Keppra  and then having subsequent seizures.  He was in status epilepticus.  He was given Versed  and Ativan  in the emergency room.  He was discharged on an increased dose of Keppra  500 mg twice a day.  Caregiver reports that he has not had any additional seizure events that they are aware of.  Remains on Keppra  500 mg twice a day     04/09/21: Jonathan [name] is a 28 year old male with a history of seizures.  He returns today for follow-up.  He is here today with his caregiver.  They deny any seizure events.  He remains on Keppra  250 mg twice a day.   10/09/20: Jonathan [name] is a 28 y.o. male with a history of cerebral palsy and seizures. He returns today for a follow up. Patient moved in with his guardian in June. Guardian reports that he was initially unaware of the patients seizure medication and the patient had three episodes in June. Once Jonathan [name] was restarted on the 250 mg Keppra  twice a day, his seizures have stopped and there have been no other episodes since. His guardian denies any other changes at this time.    HISTORY 02/24/19: Jonathan [name] is a 28 year old male with a history of cerebral palsy and seizures.  He returns today for follow-up.  He is with his grandmother today.  She reports that his last seizure was in July 2020.  She states that this is when Keppra  was restarted.  She states that since then he has not had any seizure events.  He continues to live at home with her.  She denies any further changes.  He returns today for follow-up.  REVIEW OF SYSTEMS: Out of a complete 14 system review of  symptoms, the patient complains only of the following symptoms, and all other reviewed systems are negative.  See HPI  ALLERGIES: No Known Allergies  HOME MEDICATIONS: Outpatient Medications Prior to Visit  Medication Sig Dispense Refill   cetirizine  (ZYRTEC ) 1 MG/ML syrup GIVE 10ML'S BY MOUTH AT BEDTIME AS NEEDED FOR ALLERGY SYMPTOM CONTROL 240 mL 1   levETIRAcetam  (KEPPRA ) 100 MG/ML solution TAKE 7.5ML (750MG ) BY MOUTH 2 TIMES DAILY 450 mL 4   diazepam  (DIASAT) 20 MG GEL Place 20 mg rectally as needed. (Patient not taking: Reported on 12/08/2023) 1 Package 5   No facility-administered medications prior to visit.    PAST MEDICAL HISTORY: Past Medical History:  Diagnosis Date   Allergic rhinitis    Autism    CP (cerebral palsy)  (HCC)    Mental retardation, idiopathic severe    Pneumonia 06/2015   Seizure (HCC) 08/24/2014    PAST SURGICAL HISTORY: Past Surgical History:  Procedure Laterality Date   HEMATOMA EVACUATION Left 04/21/2019   Procedure: EVACUATION HEMATOMA left ear;  Surgeon: Carlie Clark, MD;  Location: [facility];  Service: ENT;  Laterality: Left;   NO PAST SURGERIES      FAMILY HISTORY: Family History  Problem Relation Age of Onset   Arthritis Paternal Grandmother    Obesity Other    Stroke Other    Hypertension Other    Seizures Maternal Uncle     SOCIAL HISTORY: Social History   Socioeconomic History   Marital status: Single    Spouse name: Not on file   Number of children: Not on file   Years of education: Not on file   Highest education level: Not on file  Occupational History   Not on file  Tobacco Use   Smoking status: Never   Smokeless tobacco: Never  Vaping Use   Vaping status: Never Used  Substance and Sexual Activity   Alcohol use: No   Drug use: No   Sexual activity: Never    Birth control/protection: Abstinence  Other Topics Concern   Not on file  Social History Narrative   Pt lives with caregiver    Social Drivers of Health   Financial Resource Strain: Not on file  Food Insecurity: No Food Insecurity (01/12/2022)   Hunger Vital Sign    Worried About Running Out of Food in the Last Year: Never true    Ran Out of Food in the Last Year: Never true  Transportation Needs: No Transportation Needs (01/12/2022)   PRAPARE - Administrator, Civil Service (Medical): No    Lack of Transportation (Non-Medical): No  Physical Activity: Not on file  Stress: Not on file  Social Connections: Not on file  Intimate Partner Violence: Not At Risk (01/12/2022)   Humiliation, Afraid, Rape, and Kick questionnaire    Fear of Current or Ex-Partner: No    Emotionally Abused: No    Physically Abused: No    Sexually Abused: No    PHYSICAL  EXAM  Vitals:   12/08/23 1020  BP: (!) 141/91  Pulse: 96  Weight: 134 lb 6.4 oz (61 kg)  Height: 5' 9 (1.753 m)     Generalized: Well developed, in no acute distress   Neurological examination  Mentation: Alert. Does not follow any commands. nonverbal Cranial nerve II-XII: Pupils were equal round reactive to light.  Motor: The motor testing reveals 5 over 5 strength of all 4 extremities. Good symmetric motor tone is noted throughout.  Sensory: UTA Coordination:  UTA Gait and station: patient walks on his toes with a forced arch   DIAGNOSTIC DATA (LABS, IMAGING, TESTING) - I reviewed patient records, labs, notes, testing and imaging myself where available.  Lab Results  Component Value Date   WBC 3.4 (L) 01/13/2022   HGB 12.7 (L) 01/13/2022   HCT 36.9 (L) 01/13/2022   MCV 81.8 01/13/2022   PLT 192 01/13/2022      Component Value Date/Time   NA 138 01/13/2022 0953   K 3.9 01/13/2022 0953   CL 109 01/13/2022 0953   CO2 20 (L) 01/13/2022 0953   GLUCOSE 129 (H) 01/13/2022 0953   BUN 7 01/13/2022 0953   CREATININE 0.93 01/13/2022 0953   CREATININE 0.64 03/17/2015 0954   CALCIUM 8.7 (L) 01/13/2022 0953   PROT 6.9 01/13/2022 0953   ALBUMIN 3.8 01/13/2022 0953   AST 49 (H) 01/13/2022 0953   ALT 33 01/13/2022 0953   ALKPHOS 45 01/13/2022 0953   BILITOT 0.8 01/13/2022 0953   GFRNONAA >60 01/13/2022 0953   GFRAA >60 10/09/2018 1057     ASSESSMENT AND PLAN 28 y.o. year old male  has a past medical history of Allergic rhinitis, Autism, CP (cerebral palsy) (HCC), Mental retardation, idiopathic severe, Pneumonia (06/2015), and Seizure (HCC) (08/24/2014). here with    1: Seizures  - Continue Keppra  750 mg twice a day  - Advised if he has any seizure events he should let us  know - Follow-up in 1 year or sooner if needed.    Duwaine Russell, MSN, NP-C 12/08/2023, 10:28 AM [facility] [address] [place], [place] [address] [phone].     The patient's condition requires frequent monitoring and adjustments in the treatment plan, reflecting the ongoing complexity of care.  This provider is the continuing focal point for all needed services for this condition.

## 2024-12-13 ENCOUNTER — Ambulatory Visit: Admitting: Adult Health
